# Patient Record
Sex: Female | Born: 1935 | Race: Black or African American | Hispanic: No | Smoking: Former smoker
Health system: Southern US, Community
[De-identification: ages and names within clinical notes are randomized; demographics above are authoritative.]

## PROBLEM LIST (undated history)

## (undated) DIAGNOSIS — F0390 Unspecified dementia without behavioral disturbance: Secondary | ICD-10-CM

## (undated) DIAGNOSIS — K219 Gastro-esophageal reflux disease without esophagitis: Secondary | ICD-10-CM

## (undated) DIAGNOSIS — F418 Other specified anxiety disorders: Secondary | ICD-10-CM

## (undated) DIAGNOSIS — E039 Hypothyroidism, unspecified: Secondary | ICD-10-CM

## (undated) DIAGNOSIS — E119 Type 2 diabetes mellitus without complications: Secondary | ICD-10-CM

## (undated) HISTORY — PX: LAMINECTOMY: SHX219

---

## 1998-01-25 ENCOUNTER — Other Ambulatory Visit: Admission: RE | Admit: 1998-01-25 | Discharge: 1998-01-25 | Payer: Self-pay | Admitting: *Deleted

## 1998-05-17 ENCOUNTER — Emergency Department (HOSPITAL_COMMUNITY): Admission: EM | Admit: 1998-05-17 | Discharge: 1998-05-17 | Payer: Self-pay | Admitting: Emergency Medicine

## 1998-05-17 ENCOUNTER — Encounter: Payer: Self-pay | Admitting: Emergency Medicine

## 1998-12-25 ENCOUNTER — Ambulatory Visit (HOSPITAL_COMMUNITY): Admission: RE | Admit: 1998-12-25 | Discharge: 1998-12-25 | Payer: Self-pay | Admitting: Gastroenterology

## 1998-12-25 ENCOUNTER — Encounter: Payer: Self-pay | Admitting: Gastroenterology

## 1999-01-02 ENCOUNTER — Encounter (INDEPENDENT_AMBULATORY_CARE_PROVIDER_SITE_OTHER): Payer: Self-pay

## 1999-01-02 ENCOUNTER — Ambulatory Visit (HOSPITAL_COMMUNITY): Admission: RE | Admit: 1999-01-02 | Discharge: 1999-01-02 | Payer: Self-pay | Admitting: Gastroenterology

## 1999-01-02 ENCOUNTER — Encounter: Payer: Self-pay | Admitting: Gastroenterology

## 2000-02-05 ENCOUNTER — Inpatient Hospital Stay (HOSPITAL_COMMUNITY): Admission: RE | Admit: 2000-02-05 | Discharge: 2000-02-06 | Payer: Self-pay | Admitting: Neurosurgery

## 2000-02-05 ENCOUNTER — Encounter: Payer: Self-pay | Admitting: Neurosurgery

## 2000-04-22 ENCOUNTER — Ambulatory Visit (HOSPITAL_COMMUNITY): Admission: RE | Admit: 2000-04-22 | Discharge: 2000-04-22 | Payer: Self-pay | Admitting: Neurology

## 2000-04-24 ENCOUNTER — Ambulatory Visit (HOSPITAL_COMMUNITY): Admission: RE | Admit: 2000-04-24 | Discharge: 2000-04-24 | Payer: Self-pay | Admitting: Internal Medicine

## 2000-04-24 ENCOUNTER — Encounter: Payer: Self-pay | Admitting: Internal Medicine

## 2000-04-29 ENCOUNTER — Ambulatory Visit (HOSPITAL_COMMUNITY): Admission: RE | Admit: 2000-04-29 | Discharge: 2000-04-29 | Payer: Self-pay | Admitting: Internal Medicine

## 2000-04-29 ENCOUNTER — Encounter: Payer: Self-pay | Admitting: Internal Medicine

## 2000-11-13 ENCOUNTER — Encounter: Payer: Self-pay | Admitting: General Surgery

## 2000-11-18 ENCOUNTER — Observation Stay (HOSPITAL_COMMUNITY): Admission: RE | Admit: 2000-11-18 | Discharge: 2000-11-19 | Payer: Self-pay | Admitting: General Surgery

## 2000-11-18 ENCOUNTER — Encounter (INDEPENDENT_AMBULATORY_CARE_PROVIDER_SITE_OTHER): Payer: Self-pay | Admitting: Specialist

## 2001-02-05 ENCOUNTER — Encounter: Admission: RE | Admit: 2001-02-05 | Discharge: 2001-02-05 | Payer: Self-pay | Admitting: *Deleted

## 2001-02-05 ENCOUNTER — Encounter: Payer: Self-pay | Admitting: *Deleted

## 2001-02-16 ENCOUNTER — Other Ambulatory Visit: Admission: RE | Admit: 2001-02-16 | Discharge: 2001-02-16 | Payer: Self-pay | Admitting: Gastroenterology

## 2001-02-16 ENCOUNTER — Encounter (INDEPENDENT_AMBULATORY_CARE_PROVIDER_SITE_OTHER): Payer: Self-pay | Admitting: Specialist

## 2001-06-16 ENCOUNTER — Encounter: Admission: RE | Admit: 2001-06-16 | Discharge: 2001-06-16 | Payer: Self-pay

## 2001-06-18 ENCOUNTER — Ambulatory Visit (HOSPITAL_COMMUNITY): Admission: RE | Admit: 2001-06-18 | Discharge: 2001-06-18 | Payer: Self-pay | Admitting: Internal Medicine

## 2001-06-18 ENCOUNTER — Encounter: Payer: Self-pay | Admitting: Internal Medicine

## 2001-06-24 ENCOUNTER — Encounter: Payer: Self-pay | Admitting: *Deleted

## 2001-06-24 ENCOUNTER — Inpatient Hospital Stay (HOSPITAL_COMMUNITY): Admission: EM | Admit: 2001-06-24 | Discharge: 2001-06-26 | Payer: Self-pay | Admitting: Emergency Medicine

## 2001-06-24 ENCOUNTER — Encounter: Payer: Self-pay | Admitting: Cardiology

## 2001-07-22 ENCOUNTER — Encounter: Admission: RE | Admit: 2001-07-22 | Discharge: 2001-07-22 | Payer: Self-pay | Admitting: Internal Medicine

## 2001-09-29 ENCOUNTER — Encounter: Admission: RE | Admit: 2001-09-29 | Discharge: 2001-09-29 | Payer: Self-pay | Admitting: Neurosurgery

## 2001-09-29 ENCOUNTER — Encounter: Payer: Self-pay | Admitting: Neurosurgery

## 2001-10-13 ENCOUNTER — Encounter: Payer: Self-pay | Admitting: Neurosurgery

## 2001-10-13 ENCOUNTER — Encounter: Admission: RE | Admit: 2001-10-13 | Discharge: 2001-10-13 | Payer: Self-pay | Admitting: Neurosurgery

## 2001-10-14 ENCOUNTER — Encounter: Admission: RE | Admit: 2001-10-14 | Discharge: 2001-10-14 | Payer: Self-pay | Admitting: Internal Medicine

## 2001-11-11 ENCOUNTER — Encounter: Admission: RE | Admit: 2001-11-11 | Discharge: 2001-11-11 | Payer: Self-pay | Admitting: Internal Medicine

## 2002-03-08 ENCOUNTER — Encounter: Admission: RE | Admit: 2002-03-08 | Discharge: 2002-03-08 | Payer: Self-pay | Admitting: Internal Medicine

## 2002-03-11 ENCOUNTER — Encounter: Payer: Self-pay | Admitting: Internal Medicine

## 2002-03-11 ENCOUNTER — Ambulatory Visit (HOSPITAL_COMMUNITY): Admission: RE | Admit: 2002-03-11 | Discharge: 2002-03-11 | Payer: Self-pay | Admitting: Internal Medicine

## 2002-05-21 ENCOUNTER — Encounter: Admission: RE | Admit: 2002-05-21 | Discharge: 2002-05-21 | Payer: Self-pay | Admitting: Internal Medicine

## 2002-07-02 ENCOUNTER — Encounter: Admission: RE | Admit: 2002-07-02 | Discharge: 2002-07-02 | Payer: Self-pay | Admitting: Internal Medicine

## 2002-08-05 ENCOUNTER — Encounter: Admission: RE | Admit: 2002-08-05 | Discharge: 2002-08-05 | Payer: Self-pay | Admitting: Internal Medicine

## 2002-09-02 ENCOUNTER — Encounter: Admission: RE | Admit: 2002-09-02 | Discharge: 2002-09-02 | Payer: Self-pay | Admitting: Internal Medicine

## 2002-09-09 ENCOUNTER — Encounter: Admission: RE | Admit: 2002-09-09 | Discharge: 2002-09-09 | Payer: Self-pay | Admitting: Internal Medicine

## 2002-10-18 ENCOUNTER — Encounter: Payer: Self-pay | Admitting: Emergency Medicine

## 2002-10-18 ENCOUNTER — Inpatient Hospital Stay (HOSPITAL_COMMUNITY): Admission: EM | Admit: 2002-10-18 | Discharge: 2002-10-22 | Payer: Self-pay | Admitting: Emergency Medicine

## 2002-10-19 ENCOUNTER — Encounter: Payer: Self-pay | Admitting: Internal Medicine

## 2002-10-21 ENCOUNTER — Encounter: Payer: Self-pay | Admitting: Internal Medicine

## 2002-11-25 ENCOUNTER — Encounter: Admission: RE | Admit: 2002-11-25 | Discharge: 2002-11-25 | Payer: Self-pay | Admitting: Internal Medicine

## 2002-12-30 ENCOUNTER — Encounter: Admission: RE | Admit: 2002-12-30 | Discharge: 2002-12-30 | Payer: Self-pay | Admitting: Internal Medicine

## 2003-01-01 ENCOUNTER — Encounter: Payer: Self-pay | Admitting: Internal Medicine

## 2003-01-01 ENCOUNTER — Encounter: Payer: Self-pay | Admitting: Emergency Medicine

## 2003-01-01 ENCOUNTER — Inpatient Hospital Stay (HOSPITAL_COMMUNITY): Admission: EM | Admit: 2003-01-01 | Discharge: 2003-01-04 | Payer: Self-pay | Admitting: Emergency Medicine

## 2003-01-04 ENCOUNTER — Encounter: Payer: Self-pay | Admitting: Internal Medicine

## 2003-01-13 ENCOUNTER — Encounter: Admission: RE | Admit: 2003-01-13 | Discharge: 2003-01-13 | Payer: Self-pay | Admitting: Internal Medicine

## 2003-02-17 ENCOUNTER — Ambulatory Visit (HOSPITAL_COMMUNITY): Admission: RE | Admit: 2003-02-17 | Discharge: 2003-02-17 | Payer: Self-pay | Admitting: Gastroenterology

## 2003-02-17 ENCOUNTER — Encounter: Payer: Self-pay | Admitting: Gastroenterology

## 2003-03-03 ENCOUNTER — Encounter: Admission: RE | Admit: 2003-03-03 | Discharge: 2003-03-03 | Payer: Self-pay | Admitting: Internal Medicine

## 2003-03-22 ENCOUNTER — Encounter: Admission: RE | Admit: 2003-03-22 | Discharge: 2003-03-22 | Payer: Self-pay | Admitting: Internal Medicine

## 2003-04-25 ENCOUNTER — Encounter: Admission: RE | Admit: 2003-04-25 | Discharge: 2003-04-25 | Payer: Self-pay | Admitting: Internal Medicine

## 2004-10-01 ENCOUNTER — Ambulatory Visit: Payer: Self-pay | Admitting: Internal Medicine

## 2004-10-29 ENCOUNTER — Ambulatory Visit: Payer: Self-pay | Admitting: Internal Medicine

## 2004-11-02 ENCOUNTER — Ambulatory Visit: Payer: Self-pay | Admitting: Internal Medicine

## 2005-03-05 ENCOUNTER — Inpatient Hospital Stay (HOSPITAL_COMMUNITY): Admission: EM | Admit: 2005-03-05 | Discharge: 2005-03-08 | Payer: Self-pay | Admitting: Emergency Medicine

## 2005-03-06 ENCOUNTER — Encounter: Payer: Self-pay | Admitting: Cardiology

## 2005-03-06 ENCOUNTER — Ambulatory Visit: Payer: Self-pay | Admitting: Cardiology

## 2005-03-06 ENCOUNTER — Ambulatory Visit: Payer: Self-pay | Admitting: Hospitalist

## 2005-04-03 ENCOUNTER — Encounter (INDEPENDENT_AMBULATORY_CARE_PROVIDER_SITE_OTHER): Payer: Self-pay | Admitting: *Deleted

## 2005-04-03 ENCOUNTER — Ambulatory Visit: Payer: Self-pay | Admitting: Gastroenterology

## 2005-04-05 ENCOUNTER — Ambulatory Visit: Payer: Self-pay | Admitting: Cardiology

## 2005-04-29 ENCOUNTER — Emergency Department (HOSPITAL_COMMUNITY): Admission: EM | Admit: 2005-04-29 | Discharge: 2005-04-29 | Payer: Self-pay | Admitting: Emergency Medicine

## 2005-05-10 ENCOUNTER — Ambulatory Visit: Payer: Self-pay | Admitting: Internal Medicine

## 2005-07-12 ENCOUNTER — Ambulatory Visit: Payer: Self-pay | Admitting: Internal Medicine

## 2006-01-07 ENCOUNTER — Encounter: Admission: RE | Admit: 2006-01-07 | Discharge: 2006-01-07 | Payer: Self-pay | Admitting: Internal Medicine

## 2006-01-26 ENCOUNTER — Emergency Department (HOSPITAL_COMMUNITY): Admission: AD | Admit: 2006-01-26 | Discharge: 2006-01-26 | Payer: Self-pay | Admitting: Family Medicine

## 2006-04-28 DIAGNOSIS — I2510 Atherosclerotic heart disease of native coronary artery without angina pectoris: Secondary | ICD-10-CM

## 2006-04-28 DIAGNOSIS — E039 Hypothyroidism, unspecified: Secondary | ICD-10-CM

## 2006-04-28 DIAGNOSIS — E119 Type 2 diabetes mellitus without complications: Secondary | ICD-10-CM

## 2006-04-28 DIAGNOSIS — IMO0002 Reserved for concepts with insufficient information to code with codable children: Secondary | ICD-10-CM | POA: Insufficient documentation

## 2006-04-28 DIAGNOSIS — E785 Hyperlipidemia, unspecified: Secondary | ICD-10-CM

## 2006-04-28 DIAGNOSIS — F329 Major depressive disorder, single episode, unspecified: Secondary | ICD-10-CM

## 2006-04-28 DIAGNOSIS — M5137 Other intervertebral disc degeneration, lumbosacral region: Secondary | ICD-10-CM

## 2006-12-23 ENCOUNTER — Ambulatory Visit: Payer: Self-pay | Admitting: Internal Medicine

## 2006-12-23 DIAGNOSIS — K644 Residual hemorrhoidal skin tags: Secondary | ICD-10-CM | POA: Insufficient documentation

## 2006-12-23 DIAGNOSIS — N9489 Other specified conditions associated with female genital organs and menstrual cycle: Secondary | ICD-10-CM | POA: Insufficient documentation

## 2006-12-23 LAB — CONVERTED CEMR LAB: Hgb A1c MFr Bld: 5.9 %

## 2006-12-24 ENCOUNTER — Encounter (INDEPENDENT_AMBULATORY_CARE_PROVIDER_SITE_OTHER): Payer: Self-pay | Admitting: *Deleted

## 2006-12-24 LAB — CONVERTED CEMR LAB
AST: 14 units/L (ref 0–37)
BUN: 14 mg/dL (ref 6–23)
Basophils Relative: 1 % (ref 0–1)
Creatinine, Ser: 0.74 mg/dL (ref 0.40–1.20)
Eosinophils Absolute: 0.2 10*3/uL (ref 0.0–0.7)
Eosinophils Relative: 3 % (ref 0–5)
Glucose, Bld: 99 mg/dL (ref 70–99)
HCT: 36.7 % (ref 36.0–46.0)
Lymphocytes Relative: 34 % (ref 12–46)
Lymphs Abs: 2 10*3/uL (ref 0.7–3.3)
MCHC: 32.4 g/dL (ref 30.0–36.0)
MCV: 100.3 fL — ABNORMAL HIGH (ref 78.0–100.0)
Platelets: 266 10*3/uL (ref 150–400)
Sodium: 143 meq/L (ref 135–145)
Total Bilirubin: 0.4 mg/dL (ref 0.3–1.2)
WBC: 5.8 10*3/uL (ref 4.0–10.5)

## 2007-02-16 ENCOUNTER — Ambulatory Visit: Payer: Self-pay | Admitting: Gastroenterology

## 2007-02-16 LAB — CONVERTED CEMR LAB
ALT: 17 units/L (ref 0–35)
AST: 20 units/L (ref 0–37)
Albumin: 3.3 g/dL — ABNORMAL LOW (ref 3.5–5.2)
Basophils Absolute: 0 10*3/uL (ref 0.0–0.1)
CO2: 31 meq/L (ref 19–32)
Calcium: 9.3 mg/dL (ref 8.4–10.5)
Eosinophils Absolute: 0.2 10*3/uL (ref 0.0–0.6)
Eosinophils Relative: 2.7 % (ref 0.0–5.0)
GFR calc non Af Amer: 73 mL/min
Hemoglobin: 10.9 g/dL — ABNORMAL LOW (ref 12.0–15.0)
Lipase: 59 units/L (ref 11.0–59.0)
Lymphocytes Relative: 29.6 % (ref 12.0–46.0)
MCHC: 33.7 g/dL (ref 30.0–36.0)
MCV: 98.4 fL (ref 78.0–100.0)
Neutrophils Relative %: 59.9 % (ref 43.0–77.0)
Potassium: 4.2 meq/L (ref 3.5–5.1)
RDW: 12.2 % (ref 11.5–14.6)
Sodium: 146 meq/L — ABNORMAL HIGH (ref 135–145)
Total Bilirubin: 0.5 mg/dL (ref 0.3–1.2)

## 2007-02-18 ENCOUNTER — Ambulatory Visit: Payer: Self-pay | Admitting: Gastroenterology

## 2007-02-18 ENCOUNTER — Ambulatory Visit: Payer: Self-pay | Admitting: Cardiology

## 2007-02-18 LAB — CONVERTED CEMR LAB: Iron: 59 ug/dL (ref 42–145)

## 2007-02-27 ENCOUNTER — Encounter (INDEPENDENT_AMBULATORY_CARE_PROVIDER_SITE_OTHER): Payer: Self-pay | Admitting: *Deleted

## 2007-03-19 ENCOUNTER — Ambulatory Visit: Payer: Self-pay | Admitting: Gastroenterology

## 2007-03-19 ENCOUNTER — Encounter: Payer: Self-pay | Admitting: Gastroenterology

## 2007-03-19 ENCOUNTER — Encounter (INDEPENDENT_AMBULATORY_CARE_PROVIDER_SITE_OTHER): Payer: Self-pay | Admitting: *Deleted

## 2007-05-01 ENCOUNTER — Ambulatory Visit: Payer: Self-pay | Admitting: Internal Medicine

## 2007-05-01 DIAGNOSIS — D499 Neoplasm of unspecified behavior of unspecified site: Secondary | ICD-10-CM

## 2007-05-01 DIAGNOSIS — R079 Chest pain, unspecified: Secondary | ICD-10-CM

## 2007-05-04 ENCOUNTER — Telehealth: Payer: Self-pay | Admitting: *Deleted

## 2007-05-20 ENCOUNTER — Ambulatory Visit: Payer: Self-pay | Admitting: Cardiology

## 2007-05-20 ENCOUNTER — Encounter (INDEPENDENT_AMBULATORY_CARE_PROVIDER_SITE_OTHER): Payer: Self-pay | Admitting: *Deleted

## 2007-05-27 ENCOUNTER — Encounter (INDEPENDENT_AMBULATORY_CARE_PROVIDER_SITE_OTHER): Payer: Self-pay | Admitting: *Deleted

## 2007-05-27 ENCOUNTER — Ambulatory Visit: Payer: Self-pay

## 2007-06-16 ENCOUNTER — Encounter (INDEPENDENT_AMBULATORY_CARE_PROVIDER_SITE_OTHER): Payer: Self-pay | Admitting: *Deleted

## 2007-06-16 ENCOUNTER — Ambulatory Visit: Payer: Self-pay | Admitting: Cardiology

## 2007-06-18 ENCOUNTER — Ambulatory Visit: Payer: Self-pay | Admitting: Internal Medicine

## 2007-06-18 LAB — CONVERTED CEMR LAB
Blood Glucose, Fingerstick: 181
Blood Glucose, Fingerstick: 123

## 2007-06-24 ENCOUNTER — Ambulatory Visit: Payer: Self-pay | Admitting: Internal Medicine

## 2007-06-24 DIAGNOSIS — L538 Other specified erythematous conditions: Secondary | ICD-10-CM | POA: Insufficient documentation

## 2007-06-25 ENCOUNTER — Encounter (INDEPENDENT_AMBULATORY_CARE_PROVIDER_SITE_OTHER): Payer: Self-pay | Admitting: *Deleted

## 2007-07-28 ENCOUNTER — Ambulatory Visit: Payer: Self-pay | Admitting: Internal Medicine

## 2007-07-28 LAB — CONVERTED CEMR LAB: Blood Glucose, Fingerstick: 118

## 2007-07-31 ENCOUNTER — Encounter: Payer: Self-pay | Admitting: Licensed Clinical Social Worker

## 2007-08-03 ENCOUNTER — Telehealth: Payer: Self-pay | Admitting: Licensed Clinical Social Worker

## 2007-08-05 ENCOUNTER — Telehealth: Payer: Self-pay | Admitting: *Deleted

## 2007-09-03 DIAGNOSIS — Z9889 Other specified postprocedural states: Secondary | ICD-10-CM

## 2007-09-03 DIAGNOSIS — Z9089 Acquired absence of other organs: Secondary | ICD-10-CM | POA: Insufficient documentation

## 2007-09-04 ENCOUNTER — Telehealth (INDEPENDENT_AMBULATORY_CARE_PROVIDER_SITE_OTHER): Payer: Self-pay | Admitting: *Deleted

## 2007-09-16 ENCOUNTER — Encounter (INDEPENDENT_AMBULATORY_CARE_PROVIDER_SITE_OTHER): Payer: Self-pay | Admitting: *Deleted

## 2007-09-21 ENCOUNTER — Encounter (INDEPENDENT_AMBULATORY_CARE_PROVIDER_SITE_OTHER): Payer: Self-pay | Admitting: *Deleted

## 2007-09-21 ENCOUNTER — Ambulatory Visit: Payer: Self-pay | Admitting: Internal Medicine

## 2007-09-21 DIAGNOSIS — D649 Anemia, unspecified: Secondary | ICD-10-CM

## 2007-09-29 LAB — CONVERTED CEMR LAB
Ferritin: 49 ng/mL (ref 10–291)
Iron: 26 ug/dL — ABNORMAL LOW (ref 42–145)
MCHC: 32.7 g/dL (ref 30.0–36.0)
Platelets: 259 10*3/uL (ref 150–400)
RBC: 3.85 M/uL — ABNORMAL LOW (ref 3.87–5.11)
TIBC: 377 ug/dL (ref 250–470)
Vitamin B-12: 544 pg/mL (ref 211–911)

## 2007-09-30 ENCOUNTER — Telehealth: Payer: Self-pay | Admitting: Infectious Disease

## 2007-10-08 ENCOUNTER — Telehealth: Payer: Self-pay | Admitting: Infectious Disease

## 2007-10-12 ENCOUNTER — Telehealth: Payer: Self-pay | Admitting: *Deleted

## 2007-10-15 ENCOUNTER — Telehealth (INDEPENDENT_AMBULATORY_CARE_PROVIDER_SITE_OTHER): Payer: Self-pay | Admitting: *Deleted

## 2007-10-20 ENCOUNTER — Encounter: Payer: Self-pay | Admitting: Infectious Disease

## 2007-10-23 ENCOUNTER — Encounter (INDEPENDENT_AMBULATORY_CARE_PROVIDER_SITE_OTHER): Payer: Self-pay | Admitting: *Deleted

## 2007-11-06 ENCOUNTER — Encounter (INDEPENDENT_AMBULATORY_CARE_PROVIDER_SITE_OTHER): Payer: Self-pay | Admitting: *Deleted

## 2007-11-16 ENCOUNTER — Encounter (INDEPENDENT_AMBULATORY_CARE_PROVIDER_SITE_OTHER): Payer: Self-pay | Admitting: *Deleted

## 2007-11-30 ENCOUNTER — Encounter (INDEPENDENT_AMBULATORY_CARE_PROVIDER_SITE_OTHER): Payer: Self-pay | Admitting: *Deleted

## 2007-12-14 ENCOUNTER — Telehealth: Payer: Self-pay | Admitting: Infectious Diseases

## 2008-01-13 ENCOUNTER — Ambulatory Visit: Payer: Self-pay | Admitting: *Deleted

## 2008-01-13 DIAGNOSIS — R1310 Dysphagia, unspecified: Secondary | ICD-10-CM | POA: Insufficient documentation

## 2008-01-15 ENCOUNTER — Encounter: Payer: Self-pay | Admitting: Licensed Clinical Social Worker

## 2008-01-18 ENCOUNTER — Encounter (INDEPENDENT_AMBULATORY_CARE_PROVIDER_SITE_OTHER): Payer: Self-pay | Admitting: Internal Medicine

## 2008-01-22 ENCOUNTER — Encounter (INDEPENDENT_AMBULATORY_CARE_PROVIDER_SITE_OTHER): Payer: Self-pay | Admitting: *Deleted

## 2008-01-22 ENCOUNTER — Ambulatory Visit (HOSPITAL_COMMUNITY): Admission: RE | Admit: 2008-01-22 | Discharge: 2008-01-22 | Payer: Self-pay | Admitting: Internal Medicine

## 2008-01-22 ENCOUNTER — Telehealth: Payer: Self-pay | Admitting: Licensed Clinical Social Worker

## 2008-02-05 ENCOUNTER — Ambulatory Visit: Payer: Self-pay | Admitting: Internal Medicine

## 2008-02-05 ENCOUNTER — Encounter: Payer: Self-pay | Admitting: Internal Medicine

## 2008-02-05 DIAGNOSIS — R109 Unspecified abdominal pain: Secondary | ICD-10-CM | POA: Insufficient documentation

## 2008-02-05 LAB — CONVERTED CEMR LAB
Blood Glucose, Fingerstick: 156
Leukocytes, UA: NEGATIVE
Nitrite: NEGATIVE
Protein, ur: NEGATIVE mg/dL
Urine Glucose: NEGATIVE mg/dL

## 2008-02-25 ENCOUNTER — Ambulatory Visit: Payer: Self-pay | Admitting: Gastroenterology

## 2008-03-17 ENCOUNTER — Encounter (INDEPENDENT_AMBULATORY_CARE_PROVIDER_SITE_OTHER): Payer: Self-pay | Admitting: *Deleted

## 2008-03-18 ENCOUNTER — Encounter (INDEPENDENT_AMBULATORY_CARE_PROVIDER_SITE_OTHER): Payer: Self-pay | Admitting: Internal Medicine

## 2008-03-29 ENCOUNTER — Ambulatory Visit: Payer: Self-pay | Admitting: Internal Medicine

## 2008-05-17 ENCOUNTER — Ambulatory Visit: Payer: Self-pay | Admitting: Internal Medicine

## 2008-05-17 ENCOUNTER — Encounter (INDEPENDENT_AMBULATORY_CARE_PROVIDER_SITE_OTHER): Payer: Self-pay | Admitting: *Deleted

## 2008-05-17 DIAGNOSIS — M25569 Pain in unspecified knee: Secondary | ICD-10-CM

## 2008-05-17 DIAGNOSIS — Z9189 Other specified personal risk factors, not elsewhere classified: Secondary | ICD-10-CM

## 2008-05-17 LAB — CONVERTED CEMR LAB
Blood Glucose, Fingerstick: 155
Hgb A1c MFr Bld: 5.4 %

## 2008-05-30 ENCOUNTER — Telehealth: Payer: Self-pay | Admitting: *Deleted

## 2008-06-01 ENCOUNTER — Telehealth (INDEPENDENT_AMBULATORY_CARE_PROVIDER_SITE_OTHER): Payer: Self-pay | Admitting: Internal Medicine

## 2008-06-22 ENCOUNTER — Telehealth: Payer: Self-pay | Admitting: *Deleted

## 2008-06-23 ENCOUNTER — Telehealth: Payer: Self-pay | Admitting: *Deleted

## 2008-06-23 ENCOUNTER — Ambulatory Visit: Payer: Self-pay | Admitting: Internal Medicine

## 2008-06-23 ENCOUNTER — Ambulatory Visit: Payer: Self-pay | Admitting: Cardiology

## 2008-06-23 ENCOUNTER — Inpatient Hospital Stay (HOSPITAL_COMMUNITY): Admission: EM | Admit: 2008-06-23 | Discharge: 2008-06-27 | Payer: Self-pay | Admitting: Emergency Medicine

## 2008-06-27 ENCOUNTER — Encounter (INDEPENDENT_AMBULATORY_CARE_PROVIDER_SITE_OTHER): Payer: Self-pay | Admitting: *Deleted

## 2008-07-05 ENCOUNTER — Telehealth (INDEPENDENT_AMBULATORY_CARE_PROVIDER_SITE_OTHER): Payer: Self-pay | Admitting: Internal Medicine

## 2008-07-20 ENCOUNTER — Telehealth (INDEPENDENT_AMBULATORY_CARE_PROVIDER_SITE_OTHER): Payer: Self-pay | Admitting: *Deleted

## 2008-07-21 ENCOUNTER — Encounter (INDEPENDENT_AMBULATORY_CARE_PROVIDER_SITE_OTHER): Payer: Self-pay | Admitting: *Deleted

## 2008-07-21 ENCOUNTER — Ambulatory Visit: Payer: Self-pay | Admitting: Internal Medicine

## 2008-07-21 DIAGNOSIS — E876 Hypokalemia: Secondary | ICD-10-CM | POA: Insufficient documentation

## 2008-07-21 DIAGNOSIS — F0390 Unspecified dementia without behavioral disturbance: Secondary | ICD-10-CM | POA: Insufficient documentation

## 2008-07-21 LAB — CONVERTED CEMR LAB
BUN: 12 mg/dL (ref 6–23)
Blood Glucose, Fingerstick: 104
Chloride: 109 meq/L (ref 96–112)
Creatinine, Ser: 0.96 mg/dL (ref 0.40–1.20)
Potassium: 4.7 meq/L (ref 3.5–5.3)
Sodium: 141 meq/L (ref 135–145)

## 2008-08-02 ENCOUNTER — Telehealth (INDEPENDENT_AMBULATORY_CARE_PROVIDER_SITE_OTHER): Payer: Self-pay | Admitting: Internal Medicine

## 2008-08-04 ENCOUNTER — Telehealth (INDEPENDENT_AMBULATORY_CARE_PROVIDER_SITE_OTHER): Payer: Self-pay | Admitting: Internal Medicine

## 2008-08-05 ENCOUNTER — Ambulatory Visit: Payer: Self-pay | Admitting: Cardiology

## 2008-08-09 ENCOUNTER — Telehealth (INDEPENDENT_AMBULATORY_CARE_PROVIDER_SITE_OTHER): Payer: Self-pay | Admitting: *Deleted

## 2008-08-09 ENCOUNTER — Encounter (INDEPENDENT_AMBULATORY_CARE_PROVIDER_SITE_OTHER): Payer: Self-pay | Admitting: *Deleted

## 2008-08-11 ENCOUNTER — Encounter (INDEPENDENT_AMBULATORY_CARE_PROVIDER_SITE_OTHER): Payer: Self-pay | Admitting: *Deleted

## 2008-08-12 ENCOUNTER — Encounter (INDEPENDENT_AMBULATORY_CARE_PROVIDER_SITE_OTHER): Payer: Self-pay | Admitting: *Deleted

## 2008-08-25 ENCOUNTER — Telehealth (INDEPENDENT_AMBULATORY_CARE_PROVIDER_SITE_OTHER): Payer: Self-pay | Admitting: *Deleted

## 2008-09-07 ENCOUNTER — Encounter (INDEPENDENT_AMBULATORY_CARE_PROVIDER_SITE_OTHER): Payer: Self-pay | Admitting: *Deleted

## 2008-09-07 ENCOUNTER — Ambulatory Visit: Payer: Self-pay | Admitting: Internal Medicine

## 2008-09-07 DIAGNOSIS — I10 Essential (primary) hypertension: Secondary | ICD-10-CM

## 2008-09-07 LAB — CONVERTED CEMR LAB
Blood Glucose, Fingerstick: 103
Hgb A1c MFr Bld: 5.8 %

## 2008-09-29 ENCOUNTER — Encounter (INDEPENDENT_AMBULATORY_CARE_PROVIDER_SITE_OTHER): Payer: Self-pay | Admitting: *Deleted

## 2008-10-17 ENCOUNTER — Ambulatory Visit: Payer: Self-pay | Admitting: *Deleted

## 2008-10-17 ENCOUNTER — Encounter: Payer: Self-pay | Admitting: Licensed Clinical Social Worker

## 2008-10-17 ENCOUNTER — Encounter (INDEPENDENT_AMBULATORY_CARE_PROVIDER_SITE_OTHER): Payer: Self-pay | Admitting: *Deleted

## 2008-10-17 DIAGNOSIS — R339 Retention of urine, unspecified: Secondary | ICD-10-CM | POA: Insufficient documentation

## 2008-10-17 LAB — CONVERTED CEMR LAB
BUN: 13 mg/dL (ref 6–23)
Blood Glucose, Fingerstick: 119
CO2: 20 meq/L (ref 19–32)
GFR calc Af Amer: >60 mL/min (ref >60)
GFR calc non Af Amer: >60 mL/min (ref >60)
Hemoglobin, Urine: NEGATIVE
Leukocytes, UA: NEGATIVE
Nitrite: NEGATIVE
Potassium: 4 meq/L (ref 3.5–5.3)
Protein, ur: NEGATIVE mg/dL
Sodium: 143 meq/L (ref 135–145)
Urobilinogen, UA: 0.2 (ref 0.0–1.0)

## 2008-10-24 ENCOUNTER — Telehealth (INDEPENDENT_AMBULATORY_CARE_PROVIDER_SITE_OTHER): Payer: Self-pay | Admitting: *Deleted

## 2008-11-03 ENCOUNTER — Encounter: Payer: Self-pay | Admitting: Licensed Clinical Social Worker

## 2008-11-03 ENCOUNTER — Telehealth: Payer: Self-pay | Admitting: Licensed Clinical Social Worker

## 2008-11-09 ENCOUNTER — Telehealth (INDEPENDENT_AMBULATORY_CARE_PROVIDER_SITE_OTHER): Payer: Self-pay | Admitting: *Deleted

## 2008-11-15 ENCOUNTER — Inpatient Hospital Stay (HOSPITAL_COMMUNITY): Admission: EM | Admit: 2008-11-15 | Discharge: 2008-11-18 | Payer: Self-pay | Admitting: Emergency Medicine

## 2008-11-15 ENCOUNTER — Ambulatory Visit: Payer: Self-pay | Admitting: Infectious Diseases

## 2008-11-17 ENCOUNTER — Ambulatory Visit: Payer: Self-pay | Admitting: Surgery

## 2008-11-17 ENCOUNTER — Encounter (INDEPENDENT_AMBULATORY_CARE_PROVIDER_SITE_OTHER): Payer: Self-pay | Admitting: Anesthesiology

## 2008-11-18 ENCOUNTER — Encounter (INDEPENDENT_AMBULATORY_CARE_PROVIDER_SITE_OTHER): Payer: Self-pay | Admitting: *Deleted

## 2008-11-23 ENCOUNTER — Encounter: Payer: Self-pay | Admitting: *Deleted

## 2008-12-09 ENCOUNTER — Telehealth: Payer: Self-pay | Admitting: Licensed Clinical Social Worker

## 2009-01-12 ENCOUNTER — Telehealth: Payer: Self-pay | Admitting: Internal Medicine

## 2009-01-16 ENCOUNTER — Telehealth: Payer: Self-pay | Admitting: *Deleted

## 2009-01-20 ENCOUNTER — Telehealth: Payer: Self-pay | Admitting: Internal Medicine

## 2009-01-25 ENCOUNTER — Telehealth: Payer: Self-pay | Admitting: Internal Medicine

## 2009-01-26 ENCOUNTER — Encounter: Payer: Self-pay | Admitting: Internal Medicine

## 2009-01-31 ENCOUNTER — Telehealth: Payer: Self-pay | Admitting: Licensed Clinical Social Worker

## 2009-02-17 ENCOUNTER — Encounter: Payer: Self-pay | Admitting: Internal Medicine

## 2009-02-18 ENCOUNTER — Encounter: Payer: Self-pay | Admitting: Internal Medicine

## 2009-02-20 ENCOUNTER — Telehealth: Payer: Self-pay | Admitting: Infectious Diseases

## 2009-02-24 ENCOUNTER — Telehealth (INDEPENDENT_AMBULATORY_CARE_PROVIDER_SITE_OTHER): Payer: Self-pay | Admitting: *Deleted

## 2009-02-24 ENCOUNTER — Encounter: Payer: Self-pay | Admitting: Internal Medicine

## 2009-03-09 ENCOUNTER — Telehealth (INDEPENDENT_AMBULATORY_CARE_PROVIDER_SITE_OTHER): Payer: Self-pay | Admitting: *Deleted

## 2009-04-12 ENCOUNTER — Telehealth: Payer: Self-pay | Admitting: *Deleted

## 2009-04-13 ENCOUNTER — Telehealth: Payer: Self-pay | Admitting: Licensed Clinical Social Worker

## 2009-04-14 ENCOUNTER — Telehealth: Payer: Self-pay | Admitting: Licensed Clinical Social Worker

## 2009-04-21 ENCOUNTER — Telehealth: Payer: Self-pay | Admitting: Internal Medicine

## 2009-05-03 ENCOUNTER — Encounter: Payer: Self-pay | Admitting: Internal Medicine

## 2009-05-24 ENCOUNTER — Encounter: Payer: Self-pay | Admitting: Internal Medicine

## 2009-05-30 ENCOUNTER — Telehealth (INDEPENDENT_AMBULATORY_CARE_PROVIDER_SITE_OTHER): Payer: Self-pay | Admitting: *Deleted

## 2009-06-06 ENCOUNTER — Telehealth (INDEPENDENT_AMBULATORY_CARE_PROVIDER_SITE_OTHER): Payer: Self-pay | Admitting: *Deleted

## 2009-06-07 ENCOUNTER — Telehealth: Payer: Self-pay | Admitting: *Deleted

## 2009-06-08 ENCOUNTER — Encounter (INDEPENDENT_AMBULATORY_CARE_PROVIDER_SITE_OTHER): Payer: Self-pay | Admitting: *Deleted

## 2010-01-29 ENCOUNTER — Telehealth: Payer: Self-pay | Admitting: Internal Medicine

## 2010-01-31 ENCOUNTER — Telehealth: Payer: Self-pay | Admitting: Internal Medicine

## 2010-02-13 ENCOUNTER — Telehealth: Payer: Self-pay | Admitting: *Deleted

## 2010-02-13 ENCOUNTER — Telehealth: Payer: Self-pay | Admitting: Internal Medicine

## 2010-05-07 ENCOUNTER — Telehealth: Payer: Self-pay | Admitting: Internal Medicine

## 2010-07-02 ENCOUNTER — Encounter: Payer: Self-pay | Admitting: Internal Medicine

## 2010-07-12 NOTE — Progress Notes (Signed)
Summary: Refill/gh  Phone Note Refill Request Message from:  Fax from Pharmacy on January 29, 2010 9:06 AM  Refills Requested: Medication #1:  METFORMIN HCL 500 MG  TABS Take 2 tablets by mouth two times a day   Last Refilled: 01/01/2010  Method Requested: Electronic Initial call taken by: Sander Nephew RN,  January 29, 2010 9:07 AM  Follow-up for Phone Call        Rx called to pharmacy Follow-up by: Jolene Provost MD,  January 30, 2010 3:46 PM    Prescriptions: METFORMIN HCL 500 MG  TABS (METFORMIN HCL) Take 2 tablets by mouth two times a day  #120 x 5   Entered and Authorized by:   Jolene Provost MD   Signed by:   Jolene Provost MD on 01/30/2010   Method used:   Electronically to        [facility] [address]* (retail)       [address]       [place], [place]  [address]       Ph: [phone] or [phone]       Fax: [phone]   RxID:   [id number]

## 2010-07-12 NOTE — Progress Notes (Signed)
Summary: med refill./gp  Phone Note Refill Request Message from:  Fax from Pharmacy on January 31, 2010 10:07 AM  Refills Requested: Medication #1:  LISINOPRIL 5 MG TABS Take 1 tablet by mouth once a day   Last Refilled: 01/03/2010 Last appt. 10/2008.   Method Requested: Electronic Initial call taken by: Morrison Old RN,  January 31, 2010 10:07 AM  Follow-up for Phone Call        Rx denied because patient needs to come in for appt.  Follow-up by: Jolene Provost MD,  February 04, 2010 3:32 PM  Additional Follow-up for Phone Call Additional follow up Details #1::        Flag sent to [place]. Additional Follow-up by: Morrison Old RN,  February 07, 2010 9:58 AM

## 2010-07-12 NOTE — Progress Notes (Signed)
Summary: refill/ hla  Phone Note Refill Request Message from:  Fax from Pharmacy on May 07, 2010 4:46 PM  Refills Requested: Medication #1:  nova max test strips   Dosage confirmed as above?Dosage Confirmed  Medication #2:  30g sms lancets   Dosage confirmed as above?Dosage Confirmed last visit 10/2008  Initial call taken by: Freddy Finner RN,  May 07, 2010 4:47 PM  Follow-up for Phone Call        Refill approved-nurse to complete Follow-up by: Jolene Provost MD,  May 09, 2010 9:50 AM    New/Updated Medications: NOVA MAX GLUCOSE TEST  STRP (GLUCOSE BLOOD) test blood sugar 1-3 times a day Prescriptions: NOVA MAX GLUCOSE TEST  STRP (GLUCOSE BLOOD) test blood sugar 1-3 times a day  #1 box x 11   Entered and Authorized by:   Jolene Provost MD   Signed by:   Jolene Provost MD on 05/09/2010   Method used:   Telephoned to ...       [facility])       [address] [place]  [address]       Ph: [phone]       Fax: [phone]   RxID:   [id number] ACCU-CHEK SOFTCLIX LANCETS   MISC (LANCETS) to check blood sugar once daily  #100 x prn   Entered and Authorized by:   Jolene Provost MD   Signed by:   Jolene Provost MD on 05/09/2010   Method used:   Telephoned to ...       [facility])       [address] [place]  [address]       Ph: [phone]       Fax: [phone]   RxID:   [id number]

## 2010-07-12 NOTE — Progress Notes (Signed)
Summary: Refill/gh  Phone Note Refill Request Message from:  Fax from Pharmacy on February 13, 2010 2:08 PM  Refills Requested: Medication #1:  LISINOPRIL 5 MG TABS Take 1 tablet by mouth once a day   Last Refilled: 01/03/2010  Method Requested: Electronic Initial call taken by: Sander Nephew RN,  February 13, 2010 2:08 PM  Follow-up for Phone Call        Pt was last seen 10/2008. This refill was denied 8/11 by Dr Ina Homes. I agree - pt needs to be seen prior to refill. Follow-up by: Larey Dresser MD,  February 13, 2010 5:20 PM     Appended Document: Refill/gh Denial faxed to [facility]. Sander Nephew, RN February 14, 2010 11:49 PM

## 2010-07-12 NOTE — Progress Notes (Signed)
----   Converted from flag ---- ---- 02/13/2010 9:31 AM, Morrison Old RN wrote:   ---- 02/07/2010 4:04 PM, Enedina Finner wrote: I have been unable to reach this patient by phone.  She has had several missed appointment and no voice mail picked up.  Will send appointment in the mail.  ---- 02/07/2010 9:57 AM, Morrison Old RN wrote: Patrick North, Ms. [place] needs an appt. soon to cont. refills per Dr. Ina Homes.  Thanks ------------------------------  Appended Document:  I finally talked to Ms. Hild; instructed her to keep the appt. scheduled with Dr. Ina Homes 9/15.  She said she does not go to the doctor; they come to her. Stated a Dr. Fredderick Phenix comes to see her at her home.  Stated no transportation but she has a Film/video editor. She will call and let the clinic know if she is going to be here 9/15.

## 2010-09-17 LAB — TROPONIN I: Troponin I: 0.05 ng/mL (ref 0.00–0.06)

## 2010-09-17 LAB — HEPATIC FUNCTION PANEL
ALT: 25 U/L (ref 0–35)
AST: 42 U/L — ABNORMAL HIGH (ref 0–37)
Albumin: 2.9 g/dL — ABNORMAL LOW (ref 3.5–5.2)
Alkaline Phosphatase: 54 U/L (ref 39–117)
Bilirubin, Direct: 0.5 mg/dL — ABNORMAL HIGH (ref 0.0–0.3)
Indirect Bilirubin: 1.1 mg/dL — ABNORMAL HIGH (ref 0.3–0.9)
Total Bilirubin: 1.6 mg/dL — ABNORMAL HIGH (ref 0.3–1.2)
Total Protein: 7.5 g/dL (ref 6.0–8.3)

## 2010-09-17 LAB — DIFFERENTIAL
Basophils Absolute: 0 10*3/uL (ref 0.0–0.1)
Basophils Relative: 0 % (ref 0–1)
Eosinophils Absolute: 0 10*3/uL (ref 0.0–0.7)
Eosinophils Relative: 0 % (ref 0–5)
Lymphocytes Relative: 6 % — ABNORMAL LOW (ref 12–46)
Lymphs Abs: 0.9 K/uL (ref 0.7–4.0)
Monocytes Absolute: 0.9 K/uL (ref 0.1–1.0)
Monocytes Relative: 7 % (ref 3–12)
Neutro Abs: 11.9 K/uL — ABNORMAL HIGH (ref 1.7–7.7)
Neutrophils Relative %: 87 % — ABNORMAL HIGH (ref 43–77)

## 2010-09-17 LAB — URINE MICROSCOPIC-ADD ON

## 2010-09-17 LAB — MAGNESIUM: Magnesium: 1.9 mg/dL (ref 1.5–2.5)

## 2010-09-17 LAB — CREATININE, URINE, RANDOM: Creatinine, Urine: 286.5 mg/dL

## 2010-09-17 LAB — URINALYSIS, ROUTINE W REFLEX MICROSCOPIC
Glucose, UA: NEGATIVE mg/dL
Ketones, ur: 15 mg/dL — AB
Nitrite: NEGATIVE
Protein, ur: >300 mg/dL — AB
Specific Gravity, Urine: 1.031 — ABNORMAL HIGH (ref 1.005–1.030)
Urobilinogen, UA: 1 mg/dL (ref 0.0–1.0)
pH: 5.5 (ref 5.0–8.0)

## 2010-09-17 LAB — BASIC METABOLIC PANEL
BUN: 20 mg/dL (ref 6–23)
CO2: 20 mEq/L (ref 19–32)
Calcium: 8.2 mg/dL — ABNORMAL LOW (ref 8.4–10.5)
Calcium: 8.4 mg/dL (ref 8.4–10.5)
Calcium: 9.8 mg/dL (ref 8.4–10.5)
Chloride: 107 mEq/L (ref 96–112)
Chloride: 107 mEq/L (ref 96–112)
Creatinine, Ser: 0.94 mg/dL (ref 0.4–1.2)
Creatinine, Ser: 1.03 mg/dL (ref 0.4–1.2)
GFR calc Af Amer: >60 mL/min (ref >60)
GFR calc non Af Amer: 51 mL/min — ABNORMAL LOW (ref >60)
Glucose, Bld: 117 mg/dL — ABNORMAL HIGH (ref 70–99)
Sodium: 135 mEq/L (ref 135–145)

## 2010-09-17 LAB — POCT CARDIAC MARKERS
CKMB, poc: 1.1 ng/mL (ref 1.0–8.0)
Myoglobin, poc: >500 ng/mL (ref 12–200)
Troponin i, poc: <0.05 ng/mL (ref 0.00–0.09)

## 2010-09-17 LAB — CBC
HCT: 32.8 % — ABNORMAL LOW (ref 36.0–46.0)
HCT: 39.7 % (ref 36.0–46.0)
Hemoglobin: 11.1 g/dL — ABNORMAL LOW (ref 12.0–15.0)
Hemoglobin: 13.1 g/dL (ref 12.0–15.0)
MCHC: 33 g/dL (ref 30.0–36.0)
MCHC: 33.7 g/dL (ref 30.0–36.0)
MCV: 100.8 fL — ABNORMAL HIGH (ref 78.0–100.0)
MCV: 99.3 fL (ref 78.0–100.0)
Platelets: 205 10*3/uL (ref 150–400)
RBC: 3.31 MIL/uL — ABNORMAL LOW (ref 3.87–5.11)
RBC: 4 MIL/uL (ref 3.87–5.11)
RDW: 12.6 % (ref 11.5–15.5)
RDW: 12.9 % (ref 11.5–15.5)
WBC: 13 10*3/uL — ABNORMAL HIGH (ref 4.0–10.5)
WBC: 13.7 10*3/uL — ABNORMAL HIGH (ref 4.0–10.5)

## 2010-09-17 LAB — GLUCOSE, CAPILLARY
Glucose-Capillary: 118 mg/dL — ABNORMAL HIGH (ref 70–99)
Glucose-Capillary: 126 mg/dL — ABNORMAL HIGH (ref 70–99)
Glucose-Capillary: 134 mg/dL — ABNORMAL HIGH (ref 70–99)
Glucose-Capillary: 136 mg/dL — ABNORMAL HIGH (ref 70–99)
Glucose-Capillary: 143 mg/dL — ABNORMAL HIGH (ref 70–99)
Glucose-Capillary: 150 mg/dL — ABNORMAL HIGH (ref 70–99)
Glucose-Capillary: 157 mg/dL — ABNORMAL HIGH (ref 70–99)
Glucose-Capillary: 158 mg/dL — ABNORMAL HIGH (ref 70–99)
Glucose-Capillary: 165 mg/dL — ABNORMAL HIGH (ref 70–99)
Glucose-Capillary: 177 mg/dL — ABNORMAL HIGH (ref 70–99)
Glucose-Capillary: 178 mg/dL — ABNORMAL HIGH (ref 70–99)
Glucose-Capillary: 179 mg/dL — ABNORMAL HIGH (ref 70–99)

## 2010-09-17 LAB — PROTIME-INR
INR: 1.3 (ref 0.00–1.49)
Prothrombin Time: 16.3 s — ABNORMAL HIGH (ref 11.6–15.2)

## 2010-09-17 LAB — CARDIAC PANEL(CRET KIN+CKTOT+MB+TROPI)
CK, MB: 2.3 ng/mL (ref 0.3–4.0)
Relative Index: 1 (ref 0.0–2.5)
Relative Index: 1.5 (ref 0.0–2.5)
Total CK: 129 U/L (ref 7–177)
Troponin I: 0.03 ng/mL (ref 0.00–0.06)
Troponin I: 0.04 ng/mL (ref 0.00–0.06)

## 2010-09-17 LAB — APTT: aPTT: 31 seconds (ref 24–37)

## 2010-09-17 LAB — CK TOTAL AND CKMB (NOT AT ARMC)
CK, MB: 1 ng/mL (ref 0.3–4.0)
Relative Index: 0.6 (ref 0.0–2.5)
Total CK: 177 U/L (ref 7–177)

## 2010-09-17 LAB — URINE CULTURE

## 2010-09-17 LAB — CK: Total CK: 176 U/L (ref 7–177)

## 2010-09-17 LAB — BASIC METABOLIC PANEL WITH GFR
CO2: 24 meq/L (ref 19–32)
GFR calc Af Amer: >60 mL/min (ref >60)
Glucose, Bld: 202 mg/dL — ABNORMAL HIGH (ref 70–99)
Potassium: 3.8 meq/L (ref 3.5–5.1)
Sodium: 144 meq/L (ref 135–145)

## 2010-09-17 LAB — TSH: TSH: 0.403 u[IU]/mL (ref 0.350–4.500)

## 2010-09-18 LAB — GLUCOSE, CAPILLARY: Glucose-Capillary: 119 mg/dL — ABNORMAL HIGH (ref 70–99)

## 2010-09-24 LAB — GLUCOSE, CAPILLARY
Glucose-Capillary: 157 mg/dL — ABNORMAL HIGH (ref 70–99)
Glucose-Capillary: 168 mg/dL — ABNORMAL HIGH (ref 70–99)
Glucose-Capillary: 169 mg/dL — ABNORMAL HIGH (ref 70–99)
Glucose-Capillary: 124 mg/dL — ABNORMAL HIGH (ref 70–99)
Glucose-Capillary: 154 mg/dL — ABNORMAL HIGH (ref 70–99)

## 2010-09-24 LAB — FOLATE RBC: RBC Folate: 803 ng/mL — ABNORMAL HIGH (ref 180–600)

## 2010-09-24 LAB — D-DIMER, QUANTITATIVE: D-Dimer, Quant: 1.54 ug/mL-FEU — ABNORMAL HIGH (ref 0.00–0.48)

## 2010-09-24 LAB — METHYLMALONIC ACID, SERUM: Methylmalonic Acid, Quantitative: 197 nmol/L (ref 87–318)

## 2010-09-24 LAB — CBC
HCT: 32.3 % — ABNORMAL LOW (ref 36.0–46.0)
HCT: 28.9 % — ABNORMAL LOW (ref 36.0–46.0)
Hemoglobin: 10.8 g/dL — ABNORMAL LOW (ref 12.0–15.0)
Hemoglobin: 9.1 g/dL — ABNORMAL LOW (ref 12.0–15.0)
Hemoglobin: 9.4 g/dL — ABNORMAL LOW (ref 12.0–15.0)
MCHC: 32.9 g/dL (ref 30.0–36.0)
MCHC: 33.4 g/dL (ref 30.0–36.0)
MCHC: 32.4 g/dL (ref 30.0–36.0)
MCHC: 33.4 g/dL (ref 30.0–36.0)
MCV: 100.3 fL — ABNORMAL HIGH (ref 78.0–100.0)
MCV: 101.2 fL — ABNORMAL HIGH (ref 78.0–100.0)
Platelets: 226 10*3/uL (ref 150–400)
Platelets: 242 10*3/uL (ref 150–400)
Platelets: 207 10*3/uL (ref 150–400)
RBC: 2.83 MIL/uL — ABNORMAL LOW (ref 3.87–5.11)
RDW: 13.1 % (ref 11.5–15.5)
RDW: 13.3 % (ref 11.5–15.5)
RDW: 13 % (ref 11.5–15.5)

## 2010-09-24 LAB — CK TOTAL AND CKMB (NOT AT ARMC)
CK, MB: 1.1 ng/mL (ref 0.3–4.0)
Relative Index: INVALID (ref 0.0–2.5)
Total CK: 65 U/L (ref 7–177)

## 2010-09-24 LAB — BASIC METABOLIC PANEL
BUN: 3 mg/dL — ABNORMAL LOW (ref 6–23)
CO2: 26 mEq/L (ref 19–32)
Calcium: 8.7 mg/dL (ref 8.4–10.5)
Calcium: 7.9 mg/dL — ABNORMAL LOW (ref 8.4–10.5)
Calcium: 8.2 mg/dL — ABNORMAL LOW (ref 8.4–10.5)
Chloride: 110 mEq/L (ref 96–112)
Creatinine, Ser: 0.93 mg/dL (ref 0.4–1.2)
Creatinine, Ser: 0.75 mg/dL (ref 0.4–1.2)
GFR calc Af Amer: >60 mL/min (ref >60)
GFR calc Af Amer: >60 mL/min (ref >60)
GFR calc non Af Amer: 58 mL/min — ABNORMAL LOW (ref >60)
GFR calc non Af Amer: >60 mL/min (ref >60)
Glucose, Bld: 106 mg/dL — ABNORMAL HIGH (ref 70–99)
Potassium: 3.6 mEq/L (ref 3.5–5.1)
Sodium: 141 mEq/L (ref 135–145)
Sodium: 141 mEq/L (ref 135–145)
Sodium: 143 mEq/L (ref 135–145)

## 2010-09-24 LAB — DIFFERENTIAL
Basophils Absolute: 0 10*3/uL (ref 0.0–0.1)
Eosinophils Absolute: 0.2 10*3/uL (ref 0.0–0.7)
Eosinophils Relative: 3 % (ref 0–5)
Monocytes Absolute: 0.4 10*3/uL (ref 0.1–1.0)

## 2010-09-24 LAB — COMPREHENSIVE METABOLIC PANEL
ALT: 15 U/L (ref 0–35)
AST: 21 U/L (ref 0–37)
Albumin: 3.4 g/dL — ABNORMAL LOW (ref 3.5–5.2)
Alkaline Phosphatase: 62 U/L (ref 39–117)
Calcium: 9.5 mg/dL (ref 8.4–10.5)
GFR calc Af Amer: >60 mL/min (ref >60)
Glucose, Bld: 118 mg/dL — ABNORMAL HIGH (ref 70–99)
Potassium: 3.8 mEq/L (ref 3.5–5.1)
Sodium: 142 mEq/L (ref 135–145)
Total Protein: 6.8 g/dL (ref 6.0–8.3)

## 2010-09-24 LAB — URINALYSIS, ROUTINE W REFLEX MICROSCOPIC
Bilirubin Urine: NEGATIVE
Glucose, UA: NEGATIVE mg/dL
Ketones, ur: NEGATIVE mg/dL
Protein, ur: NEGATIVE mg/dL
pH: 6.5 (ref 5.0–8.0)

## 2010-09-24 LAB — IRON AND TIBC
Iron: 75 ug/dL (ref 42–135)
Saturation Ratios: 21 % (ref 20–55)
UIBC: 279 ug/dL

## 2010-09-24 LAB — POCT I-STAT, CHEM 8
BUN: 22 mg/dL (ref 6–23)
Calcium, Ion: 1.2 mmol/L (ref 1.12–1.32)
Chloride: 108 mEq/L (ref 96–112)
HCT: 34 % — ABNORMAL LOW (ref 36.0–46.0)
Sodium: 145 mEq/L (ref 135–145)
TCO2: 25 mmol/L (ref 0–100)

## 2010-09-24 LAB — CARDIAC PANEL(CRET KIN+CKTOT+MB+TROPI)
Relative Index: INVALID (ref 0.0–2.5)
Total CK: 64 U/L (ref 7–177)
Troponin I: 0.01 ng/mL (ref 0.00–0.06)

## 2010-09-24 LAB — TROPONIN I: Troponin I: <0.01 ng/mL (ref 0.00–0.06)

## 2010-09-24 LAB — RETICULOCYTES
RBC.: 3.33 MIL/uL — ABNORMAL LOW (ref 3.87–5.11)
Retic Count, Absolute: 66.6 10*3/uL (ref 19.0–186.0)

## 2010-09-24 LAB — URINE CULTURE: Colony Count: 50000

## 2010-09-24 LAB — VITAMIN B12: Vitamin B-12: 959 pg/mL — ABNORMAL HIGH (ref 211–911)

## 2010-09-24 LAB — URINE MICROSCOPIC-ADD ON

## 2010-09-24 LAB — CULTURE, BLOOD (ROUTINE X 2): Culture: NO GROWTH

## 2010-09-24 LAB — POCT CARDIAC MARKERS
Myoglobin, poc: 69.5 ng/mL (ref 12–200)
Troponin i, poc: <0.05 ng/mL (ref 0.00–0.09)

## 2010-09-24 LAB — LIPID PANEL
HDL: 38 mg/dL — ABNORMAL LOW (ref >39)
Triglycerides: 98 mg/dL (ref <150)
VLDL: 20 mg/dL (ref 0–40)

## 2010-09-24 LAB — LIPASE, BLOOD: Lipase: 69 U/L — ABNORMAL HIGH (ref 11–59)

## 2010-09-24 LAB — THYROXINE BINDING GLOBULIN: Thyroxine Bind Glob: 17 ug/mL (ref 13.0–30.0)

## 2010-09-25 LAB — GLUCOSE, CAPILLARY: Glucose-Capillary: 104 mg/dL — ABNORMAL HIGH (ref 70–99)

## 2010-10-23 NOTE — Assessment & Plan Note (Signed)
[place] OFFICE NOTE   TEESHA, GUNNERSON                     MRN:          [id number]  DATE:08/05/2008                            DOB:          1923-10-10    Ms. Passe is here for cardiology followup after her recent  hospitalization.  She was admitted with gastroenteritis and some chest  discomfort.  There was no evidence of an MI.  She stabilized and was  sent home.  There was no evidence of pulmonary embolus.  She is now here  for cardiology followup.  She had been seen in consultation by our group  in the hospital.  Today, she is doing well.  She has a history of a  carcinoid tumor.  There is question of whether or not an echo should be  done to further assess for tricuspid regurgitation.  I do not hear  murmur and clinically she is stable.   PAST MEDICAL HISTORY:   ALLERGIES:  PENICILLIN, SULFA, and CODEINE.   MEDICATIONS:  See the flow sheet in the chart.   REVIEW OF SYSTEMS:  Today, she feels well.  She is not having any chest  pain.  She has no shortness of breath.  There are no fevers or chills.  She has no headaches or skin rashes.  Otherwise, her review of systems  is negative.   PHYSICAL EXAMINATION:  VITAL SIGNS:  Blood pressure is 110/60 with a  pulse of 92.  GENERAL:  The patient is oriented to person, time and place.  Affect is  normal.  HEENT:  No xanthelasma.  She has normal extraocular motion.  NECK:  There are no carotid bruits.  There is no jugular venous  distention.  LUNGS:  Clear.  Respiratory effort is not labored.  CARDIAC:  An S1 with an S2.  There are no clicks or significant murmurs.  ABDOMEN:  Soft.  She has no significant peripheral edema.   EKG reveals that she has normal sinus rhythm.   PROBLEMS:  1. Recent hospitalization with chest discomfort felt not to be      cardiac.  2. History of carcinoid tumor.  3. Hypothyroidism.  4. History of depression.  5.  Diabetes.  6. Recent nausea, vomiting, and diarrhea that is resolved.   Cardiac status is stable.  I feel that she does not need any further  cardiac workup at this time.     Carlena Bjornstad, MD, [facility]  Electronically Signed    JDK/MedQ  DD: 08/05/2008  DT: 08/06/2008  Job #: [id number]

## 2010-10-23 NOTE — H&P (Signed)
Alexandra Booth, Alexandra Booth NO.:  [id number]   MEDICAL RECORD NO.:  [id number]           PATIENT TYPE:   LOCATION:                                 FACILITY:   PHYSICIAN:  Doran Heater. [name], M.D. DATE OF BIRTH:  1923-09-06   DATE OF ADMISSION:  DATE OF DISCHARGE:                              HISTORY & PHYSICAL   REQUESTING PHYSICIAN:  Dr. Hart Carwin of the Internal Medicine Teaching  Service.   REASON FOR CONSULTATION:  Right elbow nondisplaced radial head fracture.   HISTORY OF PRESENT ILLNESS:  The patient is an 77 year old female who  reports a fall injuring her right arm and right hip approximately 4-5  days ago.  She is complaining of right elbow pain and right hip pain.  She is currently in a posterior splint that was placed for a  nondisplaced radial head fracture.  The patient reports feeling much  better in her splint but still having pain at the elbow.  She is having  some hip pain but was thoroughly worked up for a hip fracture with  negative x-rays.   PAST MEDICAL HISTORY:  1. Diabetes.  2. Hypertension.  3. Hypothyroidism.  4. Depression.  5. Osteoarthritis.  6. History of carcinoid tumor.  7. History of anxiety.  8. Macrocytic anemia.   PAST SURGICAL HISTORY:  1. Appendectomy.  2. Cholecystectomy.  3. History of hysterectomy.   CURRENT MEDICATIONS:  Please see MAR.   ALLERGIES:  The patient is allergic to PENICILLIN and SULFA.   SOCIAL HISTORY:  The patient does have a history of remote tobacco use,  denies alcohol use and is a widow.  The patient lives at home by  herself.  She does report some falls in the past.   PHYSICAL EXAMINATION:  GENERAL:  An elderly female, in mild distress.  She is alert and oriented.  EXTREMITIES:  Examination of right upper extremity reveals well-applied  posterior splint.  She has good distal perfusion with good pulses.  She  is not significantly swollen in her hand.  She can move her fingers well  and has  good sensation.  Her shoulder is nontender, nonswollen.  Right  hip, pain free.  Internal and external rotation of the hip while seated  with a little bit of pain at the extremes.  She has normal range of  motion at the knee and ankle, good plantar flexion, dorsiflexion, and  strength.   Radiographs of the pelvis and hip are negative for fracture.  Radiographs of the elbow question of a nondisplaced radial head  fracture, otherwise congruent joint.   IMPRESSION:  Right elbow questionable radial head fracture and right hip  injury with no sign of obvious fracture.   PLAN:  I discussed with Ms. Subramaniam that she does have a fracture  which will heal upon its own.  I have recommended 1 week in the  posterior splint immobilization followed by removal of that splint and  beginning occupational therapy for range of motion of that right arm, as  well as activities of daily living.  She is to be minimal weightbearing  in the right arm at least for a month.  We would be happy to see her  back in Orthopedics in about 2-3 weeks in our office, [employer].  Our number there is [phone].  For the  hip, I would recommend physical therapy for general mobilization,  weightbearing as tolerated on the right lower extremity with assistance.  I would be concerned about both the right elbow and right hip injury  potentially creating a significant fall risk and balance problem for  her.  Certainly, after physical therapy assessment, it may be necessary  for to attend a short-term rehab stance in a skilled nursing facility.  Again, we would like to see her back in 2-3 weeks.      Doran Heater. [name], M.D.  Electronically Signed     SRN/MEDQ  D:  11/16/2008  T:  11/17/2008  Job:  [id number]

## 2010-10-23 NOTE — Assessment & Plan Note (Signed)
[place] OFFICE NOTE   Alexandra Booth, Alexandra Booth                     MRN:          [id number]  DATE:02/16/2007                            DOB:          02-21-1924    HISTORY:  Alexandra Booth is an 77 year old African-American female who  returns to see me at the request of Dr. Delman Kitten.  Alexandra Booth has had  persistent diarrhea for the past 7 to 8 months associated with  intermittent small volume hematochezia.  She relates an ongoing lower  abdominal discomfort.  She describes her diarrhea as urgent and watery.  She also relates some chest symptoms of pressure and shortness of  breath.  I advised her to return to Dr. Delman Kitten for further evaluation.  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  She declined recommendations to proceed with surgical excision  in 2004 and again in October, 2006.   CURRENT MEDICATIONS:  Current medications listed on the chart, updated  and reviewed.   MEDICATIONS ALLERGIES:  SULFA DRUGS, PENICILLIN and CODEINE.   PHYSICAL EXAMINATION:  GENERAL APPEARANCE:  No acute distress.  VITAL SIGNS:  Weight 203 pounds.  Blood pressure 100/50. Pulse 88 and  regular.  CHEST:  Clear to auscultation bilaterally.  CARDIAC:  Regular rate and rhythm without murmurs appreciated.  ABDOMEN:  Soft, nondistended.  Mild diffuse tenderness to deep  palpation.  No rebound or guarding.  No palpable organomegaly, masses or  hernias.  Normal active bowel sounds.  RECTAL:  The rectal examination is deferred to the time of colonoscopy.  NEUROLOGICAL:  Alert and oriented x3.  Grossly nonfocal.   ASSESSMENT/PLAN:  History of duodenal bulb carcinoid tumor.  Chronic  diarrhea and lower abdominal pain.  The patient has refused prior  recommendations for surgical management.  Will proceed with a CT scan of  the abdomen and pelvis.  Obtain a CMET, CBC, lipase and 24-hour  urine  collection for 5-HIAA.  The risks, benefits and alternatives to  colonoscopy with possibly biopsy and possible polypectomy and upper  endoscopy with possible biopsy discussed with the patient and she  consents to proceed.  This will be scheduled electively.  I am also  concerned that she may have medication side effects leading to diarrhea  including potential side effects from metformin, Cymbalta and other  medications.  She will follow up with Dr. Delman Kitten.     Pricilla Riffle. Fuller Plan, MD, [facility]  Electronically Signed    MTS/MedQ  DD: 02/16/2007  DT: 02/17/2007  Job #: [id number]   cc:   Mohammed Kindle, M.D.

## 2010-10-23 NOTE — Consult Note (Signed)
[name], NICHOLL NO.:  [id number]   MEDICAL RECORD NO.:  [id number]          PATIENT TYPE:  INP   LOCATION:  64                         FACILITY:  [facility]   PHYSICIAN:  Denice Booth. Stanford Breed, MD, FACCDATE OF BIRTH:  [birth year]   DATE OF CONSULTATION:  06/25/2008  DATE OF DISCHARGE:                                 CONSULTATION   The patient is an 77 year old patient of Dr. Daryel Booth who we were asked  to evaluate for chest pain.  The patient apparently has a history of  chronic chest pain based on Dr. Kae Booth notes.  She did undergo cardiac  catheterization on June 25, 2001.  At that time, she was found to  have normal LV function.  She also was found to have minor  irregularities in her left main.  She had 20% distal LAD.  There was  also a 20% circumflex.  She also apparently had a Myoview performed in  December 2008.  Admission H and P states that it was normal, but I do  not have those records available.  She was admitted by the Teaching  Service on June 23, 2008.  At that time, she was complaining of chest  pain.  The pain was diffuse in nature.  It did not radiate.  It  increased with cough.  She also was having nausea and vomiting,  increased shortness of breath, and a cough productive of greenish  sputum.  She was also having diarrhea.  Of note, she was not having  diaphoresis.  She did rule out for myocardial infarction with serial  enzymes.  She also states that her chest pain has markedly improved  since admission.  It is felt that her nausea, vomiting, and diarrhea are  most likely related to a viral gastroenteritis, and she is being treated  with hydration.  She has also been noted to be anemic and have dark  stools.  Because of her chest pain, Cardiology is asked to further  evaluate.  Of note, she has had intermittent chest pain apparently for  years.  She takes nitroglycerin for this at home.  It typically is not  related to exertion.  She  also has chronic dyspnea on exertion, but she  states this is worse recently.  She also has orthopnea, but there is no  PND or pedal edema.   PRESENT MEDICATIONS:  1. Aspirin 325 mg p.o. daily.  2. Levothyroxine 25 mcg p.o. daily.  3. Lyrica 50 mg p.o. t.i.d.  4. Remeron 50 mg p.o. nightly.  5. Aricept 5 mg daily p.o. nightly.  6. Protonix 40 mg p.o. b.i.d.   ALLERGIES:  She has an allergy to PENICILLIN and SULFA.   SOCIAL HISTORY:  She has remote history of tobacco use but has not  smoked in 50 years.  She does not consume alcohol.  She is widowed.   FAMILY HISTORY:  She does state that her mother had heart problems, she  is unclear of what type.  She had a son who died of lung cancer.   PAST MEDICAL HISTORY:  1. She is  diabetic.  2. She also has a history of hypertension by her report.  3. She also has hypothyroidism.  4. She has a history of depression as well as osteoarthritis.  5. She also has a history of carcinoid and has refused surgery in the      past.  6. She has had a prior hysterectomy.  7. There is a history of anxiety.  8. She also has a history of macrocytic anemia.  9. She has had a prior appendectomy and cholecystectomy by her report      as well.   REVIEW OF SYSTEMS:  She denies any headaches, fevers, or chills.  She  has had a productive cough.  There is no hemoptysis.  She occasionally  feels dysphagia, but there is no odynophagia.  She has had dark stools.  There is no hematochezia.  There is no dysuria or hematuria.  There are  no rashes or seizure activity.  There is orthopnea, but there is no PND.  She states she has pedal edema at times.  Her remaining systems are  negative.   PHYSICAL EXAMINATION:  VITAL SIGNS:  Today shows a blood pressure of  110/58 and a pulse of 75.  Her temperature is 97.7.  GENERAL:  She is well developed and somewhat obese.  She is in no acute  distress at present.  SKIN:  Warm and dry.  She does not appear to be  depressed.  There is no  peripheral clubbing.  BACK:  Normal.  HEENT:  Normal with normal eyelids.  NECK:  Supple with a normal upstroke bilaterally.  There are bilateral  carotid bruits noted.  There is no jugular venous distention.  I cannot  appreciate thyromegaly.  CHEST:  Clear to auscultation with expansion.  CARDIOVASCULAR:  Regular rhythm with a normal S1 and S2.  There are no  murmurs, rubs, or gallops noted.  ABDOMEN:  Nontender, nondistended.  Positive bowel sounds.  No  hepatosplenomegaly.  No mass appreciated.  There is no abdominal bruit.  She has 2+ femoral pulses bilaterally.  No bruits.  EXTREMITIES:  No edema I cannot palpate.  No cords.  She has 2+ dorsalis  pedis pulses bilaterally.  NEUROLOGIC:  Grossly intact.   Of note, she has had negative cardiac markers.  Her sodium is 143 with a  potassium of 3.6.  Her BUN and creatinine are 18 and 0.88 respectively.  Her white blood cell count is 7.1 with a hemoglobin of 9.4, hematocrit  of 28.9.  Her MCV is 102.3.  her platelet count is 206.  Her TSH is  normal.  She also had a D-dimer that was elevated at 1.54.  Her BNP was  less than 30.  She did have a CT of her chest that showed no significant  pulmonary embolus, although the examination was limited.  There was  bilateral lower lobe platelike atelectasis.  There was a large hiatal  hernia.  Electrocardiogram shows a normal sinus rhythm with nonspecific  T-wave changes.   DIAGNOSES:  1. Atypical chest pain - [name]'s symptom is most likely      musculoskeletal in origin.  She states that when she presented she      was having a productive cough and her chest pain increased with      cough.  Her enzymes have been negative, and her electrocardiogram      is nondiagnostic.  Her chest CT showed no pulmonary embolus, and  her BNP is normal.  I do not think we need to pursue further      cardiac workup at this point.  I would have the patient followup      with  Dr. Ron Parker in 2-4 weeks after discharge for consideration of      further evaluation.  It may be worthwhile to perform an      echocardiogram as an outpatient given her history of carcinoid (we      would like to rule out tricuspid regurgitation).  2. History of carcinoid - Management per primary care.  We will plan      to proceed with an echocardiogram as an outpatient.  3. Hypothyroidism.  4. History of depression.  5. Diabetes mellitus.  6. Nausea, vomiting/diarrhea - This is felt to be gas related to her      possible viral bowel gastroenteritis.  7. Macrocytic anemia - Management per the Primary Care Service.   Please call with any further questions while she is in the hospital.      Denice Booth. Stanford Breed, MD, [facility]  Electronically Signed     BSC/MEDQ  D:  06/25/2008  T:  06/25/2008  Job:  [id number]

## 2010-10-23 NOTE — Assessment & Plan Note (Signed)
[place]                            CARDIOLOGY OFFICE NOTE   NDIDI, RAMASWAMY                     MRN:          [id number]  DATE:05/20/2007                            DOB:          11/24/1923    CARDIOLOGY CONSULTATION:  The patient has had chest pain.  It is my  understanding that a catheterization has been done in the past and  showed no marked abnormality.  There was nonobstructive disease.  It was  my understanding this was done in 2003.  More recently she has had chest  discomfort.  With her discussion today it is very difficult to  understand her pain.  I do not believe that she is unstable at this time  today.   PAST MEDICAL HISTORY:  Allergies:   Dictation cancelled.     Carlena Bjornstad, MD, [facility]     JDK/MedQ  DD: 05/20/2007  DT: 05/21/2007  Job #: [id number]   cc:   Mohammed Kindle, M.D.

## 2010-10-23 NOTE — Assessment & Plan Note (Signed)
[place]                            CARDIOLOGY OFFICE NOTE   LORRINDA, MAZER                     MRN:          [id number]  DATE:05/20/2007                            DOB:          1923-09-17    [name] is reviewed for the evaluation of chest pain.  Catheterization had been done in 2003, and she had very minimal coronary  disease at that time.  The patient now has chest pain on a regular  basis.  She describes it as discomfort over her entire anterior chest.  It can be present for long periods of time at rest.  It appears to be  increasing in frequency, and she is here for further evaluation.   PAST MEDICAL HISTORY:   ALLERGIES:  1. PENICILLIN.  2. SULFA.  3. CODEINE.   MEDICATIONS:  1. Lyrica.  2. Aricept.  3. Metformin.  4. Aspirin.  5. Cymbalta.  6. Imdur.  7. Xanax.  8. Synthroid.  9. Protonix.  10.Repliva eye drops.  11.A vitamin.   OTHER MEDICAL PROBLEMS:  See the list below.   SOCIAL HISTORY:  The patient is widowed, and I do not have any work  history.  She does not smoke.   FAMILY HISTORY:  There is a family history of coronary disease, although  this is probably not contributory in this patient.   PHYSICAL EXAMINATION:  VITAL SIGNS:  The patient's blood pressure is  129/62 with a pulse of 90.  GENERAL:  The patient is oriented to person, time and place.  Her affect  reveals that her history is vague in general.  HEENT:  No xanthelasma.  She has normal extraocular motion.  There are  no carotid bruits.  There is no jugular venous distention.  LUNGS:  Clear.  Respiratory effort is not labored.  CARDIAC:  An S1 with an S2.  There are no clicks or significant murmurs.  ABDOMEN:  Soft.  There are no masses or bruits.  EXTREMITIES:  She has no significant peripheral edema.   ELECTROCARDIOGRAM:  Sinus rhythm.  There is decreased R wave in lead V2.  This may be positional.   PROBLEMS:  1. Diabetes.  2.  Hyperlipidemia.  3. Hypothyroidism.  4. Gastroesophageal reflux disease.  5. Depression.  6. Lumbar radiculopathy.  7. Degenerative disk disease.  8. Carcinoid tumor.  She has carcinoid in her duodenum.  She has      refused surgery for this on several occasions.  She still has some      diarrhea related to this.  It is of note that an echo done in      September of 2006 revealed good left ventricular function.  In      addition, there is no significant abnormality of the tricuspid      valve.  9. Chest pain.  At this point, it is not clear if this is angina or      not, but I am not convinced.  We do need to proceed with a Myoview      scan, and this will be arranged.  If  this were to be high risk,      catheterization would be considered.  Otherwise, she is to be      followed medically.   Some patient's with carcinoid tumors can have abnormalities of the  tricuspid valve.  This was not documented by echo, at least in 2006.  Follow up 2-D echocardiogram could be considered at this point, but I  did not feel that this was a pressing issue.  She will have a Myoview  scan, and I will see her for follow up.     Carlena Bjornstad, MD, [facility]  Electronically Signed    JDK/MedQ  DD: 05/20/2007  DT: 05/21/2007  Job #: [id number]   cc:   Internal Medicine Teaching Service  Mohammed Kindle, M.D.

## 2010-10-23 NOTE — Discharge Summary (Signed)
[name], Alexandra Booth NO.:  [id number]   MEDICAL RECORD NO.:  [id number]          PATIENT TYPE:  INP   LOCATION:  5501                         FACILITY:  [facility]   PHYSICIAN:  Felicity Pellegrini, MD     DATE OF BIRTH:  April 13, 1924   DATE OF ADMISSION:  11/15/2008  DATE OF DISCHARGE:  11/18/2008                               DISCHARGE SUMMARY   DISCHARGE DIAGNOSES:  1. Status post fall with likely right radial head fracture, evaluated      by orthopedics and placed in posterior splint immobilization.  2. General instability on feet and chronic hip pain making future      falls more likely, necessitating skilled nursing facility      placement.  3. Mild fever up to 100.7 in the setting of mild leukocytosis and      hypoxia down to an O2 saturation of 90% on room air, portable chest      x-ray showed atelectasis versus pneumonia, treated with 7-day      course of Avelox.  4. Question of urinary tract infection, received 3 days of Cipro.  5. Diabetes.  6. Hypothyroidism.  7. Moderate depression, evaluated in the outpatient clinic on numerous      occasions and somewhat refractory to treatment and the patient has      expressed unwillingness to pursue psychiatric workup.  8. Dementia, on Aricept.  9. Osteoarthritis.  10.Iron-deficiency anemia.  11.History of noncardiac chest pain.  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   DISCHARGE MEDICATIONS:  1. Avelox 400 mg p.o. x 6 days, prescription provided.  2. Vicodin 5/325, one tablet p.o. q.6 h. p.r.n. pain, prescription      provided.  3. Synthroid 25 mcg p.o. daily.  4. Metformin 500 mg 2 tablets p.o. twice a day.  5. Nitroglycerin 0.54 mg tabs sublingual p.r.n. chest pain.  6. Xanax 0.25 mg p.o. t.i.d. p.r.n. anxiety.  7. Lyrica 50 mg p.o. t.i.d.  8. Remeron 15 mg p.o. q.h.s.  9. Aricept 5 mg p.o. daily.  10.Loperamide 2 mg p.o. b.i.d. p.r.n.  diarrhea.  11.Omeprazole 40 mg p.o. b.i.d.  12.Lisinopril 5 mg p.o. daily.   CONDITION ON DISCHARGE:  The patient's temperature was taken shortly  pulse before discharge and it was 99, the patient was comfortable and  was satting well and was deemed suitable for discharge.  The patient is  to be discharged to skilled nursing facility where it is our hope that  she will undergo rehabilitation to regain strength and to become more  stable on her feet.  She will also finish a 7-day course of Avelox for  presumptive mild pneumonia.  The patient has an appointment scheduled  with Dr. Grier Rocher of the outpatient clinic on June 29 at 9 a.m.  Should  she not be able to make this, she can call the clinic to have it  rescheduled.   CONSULTATIONS:  Dr. [name] of Orthopedics.  Please see his history and  physical in e-chart.   IMAGING:  1.  Right hip x-ray:  No acute finding, right his hip osteoarthritis.  2. Pelvis one to two views:  No acute bony findings.  3. Right humerus x-ray:  No acute bony findings.  4. Right shoulder x-ray:  Degenerative changes, no fracture or      dislocation.  5. Two-view chest x-ray with following impression:  Chronic lung      changes but no acute pulmonary findings.  Low lung volumes with      vascular crowding and atelectasis.  Grossly intact bony thorax.  6. CT of the head:  No acute intracranial findings, generalized      cortical atrophy, microvascular disease.  7. Right elbow x-ray with the following impression:  No joint      effusion.  Possible nondisplaced radial head fracture.  8. Portable chest x-ray from November 17, 2008 on the third day of      admission:  Patchy atelectasis/infiltrate in the left lower lobe      consistent with pneumonia.   HISTORY OF PRESENT ILLNESS:  The patient is an 77 year old woman with  the below past medical history who presents after a fall and complaining  of right arm and hip and leg pain that started 3 days prior to   admission.  She does not recall what happened exactly.  Before the fall  she did feel some lightheadedness and palpitations.  She hit her right  side, hip and arm.   She relates dysuria since 3 days prior to admission.  She was brought to  the ED by family member, her niece.  They are not able to take care of  her.   ADMISSION VITALS:  VITAL SIGNS:  Temperature 98.2, blood pressure of  147/81, pulse of 97, respiratory rate of 20, O2 saturations 95% on room  air.  GENERAL:  The patient is in no acute distress.  EYES:  PERRLA, EOMI.  ENT:  No tonsillar enlargement dry mucous membranes.  NECK:  Supple.  No nuchal rigidity.  RESPIRATORY:  Bilateral air movement intact.  No wheezes, no crackles.  Mild rhonchi.  CARDIOVASCULAR: S1, S2 normal, regular rate and rhythm, plus murmur.  GI:  Bowel sounds normoactive, soft, obese, no rigidity.  No guarding.  EXTREMITIES:  Positive pulses.  MUSCULOSKELETAL:  No swelling or effusion, mild right elbow swelling,  very tender to palpation.  NEURO:  The patient unable to lift right leg due to pain, passive motion  of 4 extremities intact, cranial nerves II-XII intact.  PSYCH:  Appropriate.   ADMISSION LABS:  Sodium 144, potassium 3.8, chloride of 107, bicarbonate  24, BUN of 20, creatinine 1.03, glucose of 202, white blood cell count  13.7, hemoglobin 13.1 up from 9.1 in January 2010, platelet of 205.   UA shows white blood cell count 0 to 2, few bacteria, RBC of 0-2.  The  patient's myoglobin was elevated greater than 500 and her CK-MB was 1.1.   HOSPITAL COURSE:  1. Status post fall with resultant likely right radial head      nondisplaced fracture:  The patient was seen in the ED 3 days after      a fall where she was unable report much history on the      circumstances surrounding it.  The patient had multiple imaging      done and it revealed a possible right radial head fracture.      Orthopedics was consulted and placed the patient in a  posterior  immobilization splint.  The patient's pain was managed with IV      morphine and the patient will be discharged on p.o. Vicodin.  She      will be initially treated with low dose of Vicodin secondary to her      underlying dementia and susceptibility to high-dose narcotics.  I      am the patient's PCP in the outpatient clinic and have seen her on      multiple occasions in the clinic.  It is unfortunate that the      patient lives by herself and has very few relatives or friends in      her life.  She suffers from moderate depression secondary to her      loneliness.  We have consulted the social workers in the clinic on      numerous occasions and have discussed with her the possibility of      assisted-living facility in the past.  However, she has been      resistant to this as well as other potential interventions for her      depression.  For these reasons, now that she has fallen and will      require significant rehabilitation, we are discharging her to a      skilled nursing facility.  It is our hope that after the patient is      suitable for discharge from skilled nursing facility that she will      enter an assisted-living facility as she has significant      instability on her feet requiring a walker and appears to be a      significant risk for future falls.  2. Fever, mild cough, mild leukocytosis and questionable chest x-ray      findings indicative of possible pneumonia:  The patient did report      some mild shortness of breath on the second day of admission and      was admitted with a leukocytosis that was initially attributed to      her urinary tract infection.  The patient became mildly hypoxic      with an O2 sat of 90% on room air at night on the second day of      hospitalization.  For these reasons, a portable chest x-ray was      obtained as the patient was not able to stand for a two view.  The      portable chest x-ray, as above, describes a  potential pneumonia.      For this reason, along with fevers that popped up on the night      prior to discharge, the patient was started on 7-day course of      Avelox, receiving the first day in the hospital.  The patient's      oxygenation was satisfactory upon discharge being in the 93-100      range on room air.  She was also found to be afebrile upon      discharge at 99 degrees.  3. Urinary tract infection:  The patient did complain of some dysuria      upon admission and was found have trace leukocytes in her urine      with the micro showing 0-2 white blood cell count and many squamous      epithelial.  This picture suggests perhaps a mild urinary tract      infection and so the patient was started on  3-day course of Cipro      and completed this while hospitalized.  4. Diabetes:  The patient was switched from her oral glycemic      medications to sliding scale insulin and had her blood sugars      generally well controlled in the 120 to 170 range.  5. Hypertension:  The patient was continued on her home lisinopril and      her blood pressures were very well controlled and generally in the      0000000 systolic range.  6. Dementia:  This along with her depression significantly limits her      ability to function by herself.  For these reasons again, we would      like her to go to an assisted-living facility upon discharge from      skilled nursing facility.   DISCHARGE VITALS:  Temperature 98.9, pulse of 84, blood pressure 129/65,  respiratory rate of 22, O2 sat 93% on room air.   DISCHARGE LABS:  Sodium 135, potassium 3.6, chloride of 107, bicarbonate  20, glucose 117, BUN of 15, creatinine 0.93, white blood cell count of  1.1, hemoglobin of 11.1, platelet of 179.   Please note that the urine culture that was drawn upon admission showed  multiple bacterial morphotypes present but none predominant.   PENDING LABS:  There are no pending labs at this time.      Darlyne Russian, MD  Electronically Signed      Felicity Pellegrini, MD     LW/MEDQ  D:  11/18/2008  T:  11/18/2008  Job:  [id number]

## 2010-10-23 NOTE — Discharge Summary (Signed)
[name], Booth              ACCOUNT NO.:  [id number]   MEDICAL RECORD NO.:  [id number]          PATIENT TYPE:  INP   LOCATION:  70                         FACILITY:  [facility]   PHYSICIAN:  Doroteo Bradford. Hatcher, M.D.DATE OF BIRTH:  July 22, 1923   DATE OF ADMISSION:  06/23/2008  DATE OF DISCHARGE:  06/27/2008                               DISCHARGE SUMMARY   DISCHARGE DIAGNOSES:  1. Viral gastroenteritis.  2. Atypical chest pain, noncardiac, most likely musculoskeletal from      coughing and vomiting.  3. 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  4. History of noncardiac chest pain, status post catheterization in      2003 which showed ejection fraction 55% and mild coronary artery      disease, Myoview in December 2008 showed ejection fraction 67%,      normal left ventricular function, no ischemia.  5. Diabetes, A1c 5.4.  6. Hypothyroidism.  7. Depression.  8. Anxiety.  9. Dementia.  10.Osteoarthritis.  11.Iron deficiency anemia.   DISCHARGE MEDICATIONS:  1. Nitroglycerin 0.4 mg sublingual, place 1 tablet under tongue up to      every 5 minutes x3 doses as needed for chest pain.  Call your      physician if symptoms go away after third dose.  2. Synthroid 25 mcg p.o. daily.  3. Protonix 40 mg p.o. b.i.d.  4. Metformin 500 mg tablets take 2 tablets p.o. b.i.d.  5. Alprazolam 0.25 mg p.o. t.i.d.  6. Premarin 0.625 mg per g cream apply every other day.  7. FEC Plus 100-250-0.025-1 mg tablets (iron-vitamin C-vitamin 123456-      folic acid) 1 tablet by mouth daily.  8. Lyrica 50 mg capsule p.o. t.i.d.  9. Anucort 25 mg suppository PR b.i.d. p.r.n.  10.Remeron 15 mg p.o. nightly.  11.Vitamin B12 1000 mcg p.o. daily.  12.Aricept 5 mg p.o. daily.  13.Antidiarrheal 2 mg capsule (loperamide) 1 tablet b.i.d. p.r.n.      diarrhea.  14.Accu-Chek Lantus.  15.Accu-Chek test strips.   DISCHARGE CONDITION AND FOLLOWUP:  The  patient was discharged in stable  condition.  Her nausea and vomiting and her chest pain and dyspnea  having resolved and improved.  She is to follow up in the [facility] on July 21, 2008, at 1:30 p.m. with Dr. Cletus Gash,  and she will arrive early for a BMET prior to the appointment.  At this  time items that should be addressed are:  1. BMET should be checked for potassium given her chronic diarrhea due      to her carcinoid tumor.  2. The patient should be referred to Dr. Ron Parker of Cardiology for      possible 2-D echo to evaluate for tricuspid valvular damage due to      her carcinoid tumor.  3. She does have iron deficiency anemia, and I asked her to resume      taking her iron.  I believe the most likely source is probably from  her carcinoid tumor and thus additional workup may not be      appropriate since she has refused treatment for this in the past;      however, this should be discussed with her.  4. Apparently, Ms. Plath did not keep her appointment on February 25, 2008, with Dr. Fuller Plan of [facility].  This      appointment was for an EGD to evaluate her complaint of dysphagia.      If she continues to have this complaint and is willing to go for an      EGD, she will need another referral.   PROCEDURES:  CT angiography of the chest dated June 23, 2008, showed  no acute pulmonary embolus, bilateral lower lobe plate-like atelectasis,  and large hiatal hernia.   CONSULTATIONS:  Denice Bors. Stanford Breed, MD, [facility], of Cardiology.   BRIEF ADMITTING HISTORY AND PHYSICAL:  For full details, please see the  hospital chart, but in brief, Alexandra Booth is an 77 year old woman with  past medical history outlined above who presented to the emergency room  with chest pain, dyspnea, and nausea and vomiting and diarrhea.  She had  increasing chest pain that was associated with productive cough that  began approximately 1 week prior to  admission, the pain was substernal  but also in her left flank.  She noted worsening of the pain when she  began to have nausea and vomiting a couple of days later.  She has had  increasing dyspnea over the past few days prior to admission.  At  baseline, she does have dyspnea on exertion, but she has not been able  to walk across the room.  She has had trouble keeping food down for the  past 1-2 days prior to admission.  She had been spending more time in  bed.   PHYSICAL EXAMINATION:  Temperature 98.3, blood pressure 129/72, pulse  88, respirations 21, oxygen saturation 96% on room air.  In general, she  was in no acute distress but anxious appearing during the exam.  She had  no JVD.  Lungs were clear to auscultation bilaterally.  Heart had  regular rate and rhythm with no murmurs.  Bowel sounds were present.  The abdomen was obese, soft, nontender, and without palpable masses.  She had a boil on the left perianal area, which was very tender without  fluctuance or induration and it appeared to have drained recently.  She  had trace pedal edema in both lower extremities.  Her neurologic exam  was nonfocal.  She was anxious-appearing and did repeat herself at  times.   Sodium 145, potassium 3.6, chloride 108, bicarbonate 25, BUN 22,  creatinine 0.9, glucose 142.  White blood count 6.1, hemoglobin 10.8,  platelets 242, MCV was 100.3.  D-dimer was 1.54.  BNP was less than 30.  Point-of-care cardiac markers were negative.  CT angiography was  obtained with the results as described above.  Chest x-ray showed  chronic lung changes and no active disease.  EKGs showed nonspecific ST-  T changes, but no signs of acute ischemia.   HOSPITAL COURSE:  1. Viral gastroenteritis:  The patient was treated with IV fluids for      hydration and was started on a clear diet that she was hungry even      down in the emergency room.  Her diet was advanced as tolerated.      She did have some continued  vomiting  on her second and third      hospital days.  However, this gradually resolved and she was      tolerating a full diet by the time of discharge.  Although her      nausea and vomiting were new complaints, her diarrhea is, in fact,      an old issue related to her carcinoid tumor and she continues to      refuse surgery or any treatment for this.  She has tried Imodium in      the past without much luck and does not think it will be very      likely to help her; however, she was given a prescription for this      at discharge if she chooses to use it.  2. Atypical chest pain:  She was initially ruled out for acute      myocardial infarction with negative serial cardiac enzymes and      EKGs.  She was evaluated by Dr. Stanford Breed of Cardiology who felt      that her pain was most likely not cardiac in origin but rather      musculoskeletal, most likely as a result of her cough as well as      her nausea and vomiting.  He recommended followup with Dr. Ron Parker who      had seen her in the past for further evaluation, and in particular      an echocardiogram as an outpatient given her history of carcinoid      to rule out tricuspid regurgitation.  3. Depression and anxiety and dementia:  The patient was continued on      her home medications of Remeron and Xanax for her depression and      anxiety as well as Aricept for dementia.  At times, she was      uncooperative with staff and occasionally seemed to be confused      about the timing of recent events.  Much of this seemed to improve      as her acute viral illness resolved.  She was up on her own,      getting out of bed, and cleaning of her room in anticipation of      having visitors on the day of discharge.  Although she did have      some complaints of depression noting the relatively recent losses      of her husband and son, I noted that she had been taken off      Cymbalta at her last clinic appointment and she was discharged       without any additional antidepressant medication.  She is to follow      up with Dr. Cletus Gash in the outpatient clinic as mentioned above.  4. Iron deficiency anemia:  The patient's anemia panel showed normal      B12 and folate levels with iron 75, total iron binding capacity      354, percent saturation 21%, and ferritin 36 consistent with      borderline iron deficiency anemia.  She was encouraged to take      iron, which is already listed on her medication list and given a      prescription for this.  We could consider additional workup for the      source of her iron deficiency, although it is most likely due to      her carcinoid tumor for which she already  refused the surgery, so      we should discuss with the patient whether she is amendable to any      additional workup before proceeding.   DISCHARGE LABORATORY DATA AND VITAL SIGNS:  Temperature 97.5, pulse 75,  respirations 18, blood pressure 106/57, oxygen saturation 99% on room  air.  White blood count 6.1, hemoglobin 9.1, MCV 100.9, platelets 207.  Sodium 141, potassium 3.4 (Please note, the patient received 2 doses of  potassium chloride 40 mEq each after the BMET drawn), chloride 112,  bicarbonate 21, glucose 106, BUN 3, creatinine 0.75, calcium 7.9.  Methylmalonic acid was 197.      Lajean Saver, MD  Electronically Signed      Doroteo Bradford. Johnnye Sima, M.D.  Electronically Signed    PN/MEDQ  D:  06/28/2008  T:  06/29/2008  Job:  [id number]   cc:   Darlyne Russian, MD

## 2010-10-26 NOTE — Discharge Summary (Signed)
NAME:  Alexandra Booth, Alexandra Booth                        ACCOUNT NO.:  [id number]   MEDICAL RECORD NO.:  [id number]                   PATIENT TYPE:  INP   LOCATION:  [id number]                                 FACILITY:  [facility]   PHYSICIAN:  Thomes Lolling, M.D.                 DATE OF BIRTH:  07/21/1923   DATE OF ADMISSION:  01/01/2003  DATE OF DISCHARGE:  01/04/2003                                 DISCHARGE SUMMARY   DISCHARGE DIAGNOSES:  1. Chest pain.  2. Hypertension.  3. Hyperlipidemia.  4. Hypothyroid.  5. Gastroesophageal reflux disease.   DISCHARGE MEDICINES:  1. Aspirin 325 mg p.o. daily.  2. Hydrochlorothiazide 25 mg p.o. daily.  3. Synthroid 25 mcg p.o. daily.  4. Lisinopril 10 mg p.o. daily.  5. Metoprolol 25 mg p.o. b.i.d.  6. Protonix 40 mg p.o. b.i.d.  7. Zocor 40 mg p.o. daily.  8. Sucralfate 1 g p.o. daily.  9. Tylenol 650 mg p.o. q.4 h. p.r.n. for pain.  10.      Nitroglycerin 0.4 mg sublingual q.5 min. x3 p.r.n. for chest pain.  11.      Glipizide 12.5 mg p.o. daily.   FOLLOWUP:  A followup appointment was made with Dr. Mamie Laurel at  the [facility] and an appointment was made for the patient  with a followup with the outpatient diabetes education coordinator.   PROCEDURES:  1. Portable chest x-ray on January 01, 2003.  Impression:  Left basilar     scarring.  No evidence of acute disease.  2. PA and lateral chest x-ray on January 01, 2003.  Impression:  Stable exam.     Atelectatic change versus scar at left base.  3. Upper gastrointestinal on January 04, 2003.  Impression:  Probable stricture     of the lower cervical esophagus versus very prominent cricopharyngeus     muscle.  Moderate-sized sliding hiatus hernia with marked dyskinesia and     tertiary contractions of the esophagus.  No reflux, stricture or     esophagitis demonstrated, filling defect or deficits in the duodenal     bulb.   BRIEF HISTORY AND PHYSICAL FROM January 01, 2003:   HISTORY  OF PRESENT ILLNESS:  The patient is a 77 year old African American  female with past medical history significant for coronary artery disease,  hypertension, hyperlipidemia, who presents with acute-onset chest pain and  shortness of breath.  The patient states that while sitting down looking at  picture album, had sudden onset of chest pain.  At that time, she laid down  on the bed and chest pain got worse, radiating to left arm and wrist.  Chest  pain on the left side, midclavicular area, described as sharp pain (8 to 9  out of 10) associated with shortness of breath, diaphoresis and nausea.  She  attributes onset of pain to frustration while looking for a picture.  Her  son called and was told about chest pain.  Son called 911.  In transit, the  patient received nitroglycerin/aspirin/O2.  Chest pain got better after  nitroglycerin/aspirin/O2.  Currently, patient says chest pain is better  (5/10).  Patient also states that she got out of bed to eat spaghetti and  meatballs and the pain got worse, so she could not eat any more.  She  stopped eating and laid back down, and got short of breath and diaphoretic.  She has not used the nitroglycerin more than once per month prior to today.  The patient also describes two to three days of stomach pain above the pubis  and pain while walking.   ALLERGIES:  Allergies to PENICILLIN that causes itch and rash.   PAST MEDICAL HISTORY:  Past medical history significant for:  1. Hypertension.  2. Hyperlipidemia.  3. Coronary artery disease.  4. Hypothyroidism.  5. Gastroesophageal reflux disease.  6. History of hiatal hernia.  7. History of chronic dizziness.  8. History of depression.  9. History of anxiety.  10.      Status post tonsillectomy.  11.      History of low back pain with radiculopathy, status post     laparoscopic cholecystectomy in 2002.  12.      Status post bilateral L3, L4, L5 decompression and laminectomy in     2001.  52.       A cardiac catheterization in 1997 showed:  #1 - Left main coronary     artery:  Mild irregularities to LAD, trivial.  #2 - First diagonal:  Mild     disease.  #3 - Left circumflex:  Two obtuse marginal branches -- mild     disease.  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  #5 - RCA:  Dominant.  Luminal irregularity.  #6 - Left     ventricular EF greater than 55%.  No wall motion abnormalities.     Impression:  Mild CAD.  14.      Cardiac catheterization from January 2003.  Impression:  Mild     coronary artery disease, well-preserved left ventricular function, EF of     55%.   MEDICATIONS ON ADMISSION:  1. Hydrochlorothiazide 25 mg daily.  2. Lopressor 25 mg p.o. daily.  3. Synthroid 25 mcg p.o. daily.  4. Ativan 1 mg p.o. nightly.  5. Protonix 40 mg daily.  6. Wellbutrin 200 mg p.o. q.a.m. and 100 mg p.o. q.p.m.  7. Tylenol 650 mg p.o. q.6 h. p.r.n.  8. Lipitor 20 mg p.o. daily.  9. Lisinopril 10 mg p.o. daily.   HABITS:  The patient had a significant substance history of being a former  smoker of one pack per day for five years while she was a teenager.   SOCIAL HISTORY:  She is divorced x2, had a ninth-grade education, worked in  [employer] fund[employer].  The patient lives in [place] with a roommate and foster  daughter.   FAMILY HISTORY:  Her family history was significant for her mother who died  at age 19 with a history of diabetes and father died in his 47s with MI and  kidney problems.  She has one son with kidney problems that requires  dialysis and kidney transplant.   REVIEW OF SYSTEMS:  The patient denied fever, chills, weight loss, nose  bleed, sore throat, syncope, cough, sputum, hemoptysis, vomiting, diarrhea,  constipation and hematemesis.  Review of systems is positive  for chest pain,  palpitations, dyspnea, orthopnea, dyspnea on exertion, nausea, dysphagia,  abdominal pain, dysuria, urinary frequency and weakness.   PHYSICAL  EXAMINATION:  VITAL SIGNS:  Pulse 81, BP 112/55, temperature 97.6,  respiratory rate 22, O2 saturation 99%.  GENERAL:  In pain.  EYES:  Pupils equal, round and reactive to light and accommodation;  extraocular muscles are intact bilaterally.  ENT:  Oropharynx clear.  NECK:  Supple, no JVD, no bruits.  LUNGS:  Clear to auscultation bilaterally.  CARDIOVASCULAR:  Regular rate and rhythm, no murmurs, gallops or rubs.  CHEST:  Pain to palpation, subxiphoid and along midclavicular line on chest.  GI:  Positive bowel sounds.  Nondistended, tender to palpation in bilateral  suprapubic area.  EXTREMITIES:  Dorsalis pedis pulses 2+.  No ankle edema.  SKIN:  No lesions.  LYMPH:  No cervical lymphadenopathy.  MUSCULOSKELETAL:  Muscle strength 5/5 in bilateral upper and lower  extremities.  NEUROLOGIC:  Cranial nerves II-XII intact.  PSYCHIATRIC:  Oriented x4.   LABORATORIES ON ADMISSION:  Pro time 13.3, INR 1.0, PTT 31.  I-  STAT/electrolytes:  PCO2 26.9, pH 7.536, bicarb 23.0, TCO2 24, acid base  excess 1, hemoglobin 14.0, hematocrit 41.0, sodium 139, potassium 3.8,  chloride 107, glucose 129, BUN 18.  I-STAT creatinine 1.5.  Cardiac markers  on January 01, 2003 at 1:33:  CK-MB 1.7, troponin I less than 0.05, myoglobin  159.  Cardiac markers on January 01, 2003 at 2:46:  CK-MB 1.1, troponin I of  less than 0.05, myoglobin 105.  Alcohol less than 5.  Cardiac markers on  January 01, 2003 at 4:10:  CK-MB 1.6, troponin I less than 0.05, myoglobin 128.  Urinalysis:  Color yellow, appearance cloudy, specific gravity 1.023, pH  5.5, glucose negative, bilirubin negative, ketones 15, blood negative,  protein negative, urobilinogen 1, nitrite negative, leukocytes small.  Urine  drug screen was negative.  Microscopic urine was significant for many  squamous epithelials per low-power field, casts per high-power field and  many bacteria.  Cardiac enzymes from January 01, 2003 at 10:18:  Creatine  kinase total 128,  CK-MB 1.8, relative index 1.5, troponin I 0.01.  TSH  2.204.  Lipid profile:  Cholesterol 167, triglycerides 61, HDL 60, total  cholesterol/HDL ratio 2.8, cholesterol LDL 95.  CBC with differential:  WBC  7.3, RBC 3.73, hemoglobin 12.8, hematocrit 37.6, MCV 100.7, MCHC 34, RDW 12,  platelet count 246,000; neutrophils percent 54, lymphocytes percent 34,  monocytes percent 8, eosinophils percent 4, basophils percent 0; neutrophils  absolute 3.9, lymphocytes absolute 2.5, monocytes absolute 0.6, eosinophils  absolute 0.3, basophils absolute 0.0.  Comprehensive metabolic profile:  Sodium 136, potassium 3.5, chloride 107, CO2 22, glucose 103, BUN 17,  creatinine 0.9, bilirubin total 0.6, alkaline phosphatase 72, SGOT 22, SGPT  19, total protein 7.1, albumin 3.4, calcium 9.1.  ABG:  FIO2 of 0.21,  _______ 37.0, pH 7.468, PCO2 31.5, PO2 96.3, bicarb 24.5, PCO2 23.5, base  deficit 0.8, oxygen saturation 97.7.  The patient was admitted with chest pain.   HOSPITAL COURSE:  PROBLEM #1 - CHEST PAIN:  The patient's chest pain was  thought to be atypical with features suggestive of a GI etiology, however,  she did have risk factors including hypertension, increased lipids and  therefore was admitted to rule out MI with cardiac enzymes and EKGs.  She  was started on aspirin, her beta blocker was continued and her ACE inhibitor  was continued.  She was given nitroglycerin  p.r.n. for chest pain; she was  also started on a proton pump inhibitor.  Her lipids were rechecked for  stratification and old catheterization records showed no disease significant  for intervention.  The patient had no cardiac enzyme changes or EKG changes  suggestive of ischemic disease.  It was therefore thought that her pain was  likely secondary to a GI etiology.  As such, a barium swallow and upper GI  were obtained.  The patient's upper GI showed gastroesophageal reflux  disease and spasm that likely explains her chest symptoms.   She was  discharged on a proton pump inhibitor.   PROBLEM #2 - HYPERTENSION:  The patient presented with a BP of 112/55.  Her  beta blocker and ACE inhibitor and hydrochlorothiazide were continued during  her hospitalization.   PROBLEM #3 - HYPERLIPIDEMIA:  The patient was continued on her Lipitor and  her fasting lipid profile was examined for further risk stratification.  She  was discharged on her statin.   PROBLEM #4 - HYPOTHYROID:  The patient's TSH was within normal limits and  she was continued on Synthroid throughout her hospitalization and discharged  with this medicine.   PROBLEM #5 - GASTROESOPHAGEAL REFLUX DISEASE:  The patient was given  Protonix and discharged with this medicine.   DISCHARGE LABORATORIES:  BMP:  Sodium 139, potassium 3.8, chloride 104, CO2  25, glucose 114, BUN 21, creatinine 1.0, calcium 9.5.  CBC:  WBC 7.1, RBC  3.78, hemoglobin 12.6, hematocrit 37.6, MCV 99.4, MCHC 33.7, RDW 11.8,  platelet count 234,000.      Mamie Laurel, MD                   Thomes Lolling, M.D.    CW/MEDQ  D:  04/26/2003  T:  04/26/2003  Job:  [id number]   cc:   [facility]

## 2010-10-26 NOTE — Cardiovascular Report (Signed)
[place]. [facility]  Patient:    Alexandra Booth, Alexandra Booth Visit Number: [id number] MRN: [id number]          Service Type: MED Location: [id number] Attending Physician:  Ernestine Mcmurray Dictated by:   Christy Sartorius, M.D. Proc. Date: 06/25/01 Admit Date:  06/24/2001   CC:         Terald Sleeper, M.D. [facility]  Gretchen ______, M.D., [facility] Teaching Service   Cardiac Catheterization  PROCEDURES PERFORMED: 1. Left heart catheterization. 2. Left ventriculogram. 3. Selective coronary angiography. 4. Attempted Perclose placement.  DIAGNOSES: 1. Mild coronary artery disease by angiogram. 2. Normal left ventricular systolic function. 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  INDICATIONS: The patient is a 77 year old, black female, with a history of mild coronary artery disease, who presents with substernal chest discomfort. She has had exertional and rest symptoms that appear to have been progressive and she was admitted to the hospital. She ruled out for acute myocardial infarction and she presents now for further assessment.  TECHNIQUE: After informed consent was obtained, the patient was brought to the cardiac catheterization lab where a 6 French sheath was placed in the right femoral artery. Left heart catheterization, left ventriculogram, and selective coronary angiography were then performed using preformed 6 French Judkins catheters. An attempt was made to deploy a Perclose suture closure device. However, adequate hemostasis could not be achieved and manual pressure was applied until hemostasis was achieved. She was then transferred to the ward in stable condition.  She tolerated the procedure well.  FINDINGS: Findings are as follows: 1. Left main trunk: The left main trunk is a large caliber vessel with    mild irregularities. 2. LAD: This is a medium caliber vessel that provides the trivial first    diagonal branch in the proximal segment. The LAD system  has mild    irregularities of 10-20% in the distal segment. 3. Left circumflex artery: The left circumflex artery is a large caliber    vessel that provides two marginal branches. There are mild irregularities    of 20% in the left circumflex system. 4. Ramus intermedius: This is a large caliber vessel that bifurcates in its    distal segment. It has mild irregularities. It appears to provide flow    to the LV cavity via a fistula. 5. Right coronary artery: Dominant. This is a medium caliber vessel that    provides the posterior descending artery in its terminal segment.    There are luminal irregularities in the right coronary system.  LEFT VENTRICULOGRAM: Normal end-systolic and end-diastolic dimensions. Overall left ventricular function is well preserved, ejection fraction of greater than 55%.  No mitral regurgitation. LV pressure is 120/0, aortic equals 120/53, LVEDP equals 17.  ASSESSMENT AND PLAN: The patient is a 77 year old, black female, with mild coronary artery disease and well preserved left ventricular function. Continued medical therapy should be pursued. The patient has evidence of left ventricular fistula that does not appear to be significant. Dictated by:   Christy Sartorius, M.D. Attending Physician:  Ernestine Mcmurray DD:  06/25/01 TD:  06/26/01 Job: [id number]

## 2010-10-26 NOTE — Discharge Summary (Signed)
[place]. [facility]  Patient:    Alexandra Booth, Alexandra Booth Visit Number: [id number] MRN: [id number]          Service Type: MED Location: [id number] Attending Physician:  Ernestine Mcmurray Dictated by:   Arn Medal, P.A.C. Admit Date:  06/24/2001 Discharge Date: 06/26/2001   CC:         Idamae Schuller, M.D.  Terald Sleeper, M.D.   Discharge Summary  DISCHARGE DIAGNOSES: 1. Chest pain, status post cardiac catheterization. 2. Hypothyroidism. 3. History of hiatal hernia. 4. History of depression. 5. History of chronic dizziness. 6. History of low back pain with radiculopathy.  HOSPITAL COURSE:  Alexandra Booth is a pleasant 77 year old African-American female.  She had a history of catheterization in 1997 by Dr. Lia Foyer which showed 30 to 50% left main disease.  She had a recent history of approximately one month of substernal chest pain which appeared to occur both at rest and on exertion.  Her pattern had increased over the previous few days with episodes lasting 15 to 20 minutes.  She saw Idamae Schuller, M.D. who gave the patient nitroglycerin as well as Plavix.  She was seen and admitted by Terald Sleeper, M.D.  Dr. Dannielle Burn felt that given the patients history, she would need a relook cardiac catheterization.  On January 16, the patient was taken to the catheterization lab by Christy Sartorius, M.D. [facility].  CATHETERIZATION RESULTS: 1. Left main coronary artery; mild irregularities. 2. Left anterior descending; trivial first diagonal, mild disease. 3. Left circumflex; two obtuse marginal branches, mild disease. 4. Ramus intermedius; trivial irregularities, fistula to the left ventricle. 5. RCA; dominant, luminal irregularities. 6. Left ventricle; ejection fraction greater than 55%, no wall motion    abnormality.  Dr. Lyndel Safe felt the patient had mild coronary artery disease and recommended that other etiologies for her chest pain be persued.  The next day, the  patient was doing well and had no further chest pain and shortness of breath and was felt to be stable for discharge.  DISCHARGE MEDICATIONS: 1. Ativan as needed for sleep. 2. Prilosec 20 mg q.d. 3. Meclozine 25 mg t.i.d. p.r.n. 4. The patient is to resume other home medicines as previously taken. 5. The patient is to discontinue taking Plavix.  LABORATORY DATA:  Sodium 139, potassium 3.6, chloride 107, CO2 28, BUN 18, creatinine 0.8, glucose 127.  White count 5.6, hemoglobin 13.3, hematocrit 39.0, platelets 235, MCV 100.3, MCHC 34.2, RDW 11.8.  TSH 0.711, free T4 1.08. Serial cardiac enzymes were negative for MI.  Portable chest x-ray on admission showed severe degenerative changes in the left shoulder.  Lungs were clear of acute disease and heart size was normal.  Electrocardiogram showed normal sinus rhythm at 84.  There was noted to be a left axis deviation.  PR interval 153, QRS 83, QTC 453, axis -39.  DISCHARGE INSTRUCTIONS:  The patient is to avoid driving, heavy lifting, or tub baths for 48 hours.  She is to follow a low fat, low cholesterol diet. She is to watch the catheterization site for any pain, bleeding, or swelling, and to call the [facility] for any of these problems.  FOLLOW-UP:  She is to follow up with Idamae Schuller, M.D. and call for an appointment in the week after discharge. Dictated by:   Arn Medal, P.A.C. Attending Physician:  Ernestine Mcmurray DD:  06/26/01 TD:  06/28/01 Job: [id number]

## 2010-10-26 NOTE — Assessment & Plan Note (Signed)
[place] OFFICE NOTE   MAESON, GRIFFON                     MRN:          [id number]  DATE:06/15/2006                            DOB:          [birth year]    Alexandra Booth is seen for follow-up.  See my note of May 20, 2007.  At that time we decided to do a Myoview scan to be sure about her chest  discomfort.  This study is normal.  She has normal wall motion.  There  is no sign of scar or ischemia.   Today, I am seeing her back in follow-up.  She has chronic chest wall  pain.  I believe that it is not cardiac.  I have chosen to proceed with  no further cardiac workup.  I have encouraged her to see her primary  physicians back.   PAST MEDICAL HISTORY:   ALLERGIES:  PENICILLIN, SULFA AND CODEINE.   MEDICATIONS:  28yrica, metformin, aspirin, Cymbalta, Imdur, Xanax,  Synthroid, Protonix, multivitamin,  Aricept, lisinopril and fluoxetine.   OTHER MEDICAL PROBLEMS:  See the list on my note of May 20, 2007.   REVIEW OF SYSTEMS:  Other than the HPI, the review of systems is  negative.   PHYSICAL EXAMINATION:  Blood pressure is 127/62.  Pulse is 87.  The  patient is oriented to person, time and place.  She appears fatigued,  but stable.  Lungs are clear.  Cardiac exam reveals S1-S2.  There are no clicks or significant murmurs.   Problems are listed on my note of May 20, 2007.  Her cardiac status  is stable.  No further workup.     Carlena Bjornstad, MD, [facility]  Electronically Signed    JDK/MedQ  DD: 06/16/2007  DT: 06/16/2007  Job #: [id number]   cc:   Mohammed Kindle, M.D.

## 2010-10-26 NOTE — H&P (Signed)
[place]  Patient:    Alexandra Booth, Alexandra Booth Visit Number: [id number] MRN: [id number]          Service Type: Attending:  Terald Sleeper, M.D. [facility] Dictated by:   Terald Sleeper, M.D. [facility] Adm. Date:  06/24/01   CC:         Lucendia Herrlich, M.D.                         History and Physical  DATE OF BIRTH:  08/26/1923  REFERRING PHYSICIAN:  Lucendia Herrlich, M.D.  CURRENT COMPLAINTS:  Substernal chest pain and palpitations.  HISTORY OF PRESENT ILLNESS:  The patient is a 77 year old African-American female with a history of coronary artery disease, status post cardiac catheterization in 1997 by Loretha Brasil. Lia Foyer, M.D.  The patient is now seen in the office today after referral by Lucendia Herrlich, M.D., due to new onset substernal chest pain.  The patient reports to me approximately a one-month history of substernal chest pain which appears to be occurring both at rest and on exertion.  Her pattern of chest pressure, however, increased over the last few days with prolonged episodes of substernal tightness last evening lasting 15-20 minutes.  The patient reports radiation of pain to the left shoulder and left arm, as well as associated shortness of breath.  The patient was given nitroglycerin by Lucendia Herrlich, M.D., but was afraid to take this due to the fact that it is giving her a headache.  She also reports palpitations for approximately one month, sometimes lasting 10-15 minutes, associated with dizziness, and occurring at erratic times.  She reports no orthopnea or PND.  She is not clear in the history that her symptoms of chest pain are in a crescendo pattern.  ALLERGIES:  PENICILLIN and ASPIRIN.  MEDICATIONS: 1. Plavix 75 mg a day, which was started by Lucendia Herrlich, M.D., at the    last clinic visit due to the patients allergy to aspirin. 2. Protonix 40 mg a day. 3. Protonix 40 mg a day. 4. Citrucel. 5. Lorazepam.  PAST  MEDICAL HISTORY: 1. Cardiac catheterization in 1997 by Loretha Brasil. Lia Foyer, M.D., with    30-50% left main disease ______ contemplated, but no performed at that    time.  Otherwise nonobstructive CAD.  This was followed by Persantine    Cardiolite, which was within normal limits. 2. Hypothyroidism. 3. Status post tonsillectomy. 4. History of hiatal hernia. 5. History of depression. 6. History of chronic dizziness. 7. History of low back pain with radiculopathy.  SOCIAL HISTORY:  The patient lives in [place], [place].  She has moved from [place].  She lives with a roommate and her son, who is on hemodialysis.  She is a former Scientist, forensic.  She used to smoke tobacco, but quit 30 years ago.  She does not drink alcohol or use drugs or herbal medication.  FAMILY HISTORY:  Noncontributory.  REVIEW OF SYSTEMS:  No fevers, chills, or sweats.  She complains of headaches and vertiginous symptoms occurring at night when turning over on pillow. Skin:  No rash or lesions.  Cardiopulmonary:  Chest pain, shortness of breath, and dyspnea on exertion as outlined above.  Palpitations.  No claudication, cough, or wheezing.  No frequency or dysuria.  No weakness or numbness.  No myalgias or arthralgias.  No nausea or vomiting.  No polyuria or polydipsia.  PHYSICAL EXAMINATION:  VITAL SIGNS:  Blood pressure 128/70, heart rate 97 beats  per minute, respirations 18 per minute.  GENERAL APPEARANCE:  No distress.  HEENT:  NCAT.  PERRLA.  EOMI.  Sclerae clear.  NECK:  Supple.  No bruit or JVD.  LYMPHADENOPATHY:  None.  CARDIOVASCULAR:  Regular rate and rhythm.  Normal S1 and S2.  No S3 or S4. PMI nondisplaced.  LUNGS:  Clear breath sounds bilaterally without rhonchi.  SKIN:  No rash or lesions.  ABDOMEN:  Soft and nontender.  No rebound or guarding.  GENITOURINARY:  Deferred.  RECTAL:  Deferred.  EXTREMITIES:  No clubbing, cyanosis, or edema.  MUSCULOSKELETAL:  No joint  deformities.  NEUROLOGIC:  Patient alert and oriented.  Grossly nonfocal.  LABORATORY DATA:  The chest x-ray is pending.  EKG with normal sinus rhythm, heart rate 97 beats per minute, normal axis, and nonspecific T-wave changes. Labs pending.  IMPRESSION AND PLAN: 1. Substernal chest pain.  The patient has multiple risk factors for coronary    artery disease.  Her chest pain is rather concerning for an acute coronary    syndrome.  She had a prolonged episode of substernal chest pain last night.    Due to her history of a left main 30-50% lesion, we have decided to admit    the patient for cardiac catheterization tomorrow.  She will be given    nitroglycerin, Plavix, and beta blocker therapy. 2. Vertigo.  This is secondary to benign positional vertigo.  The patient can    receive Antivert.  If the symptoms of headache and vertigo continue, she    may need a CT scan prior to hospitalization. 3. History of hypothyroidism.  Will recheck a TSH level.  DISPOSITION:  The patient will be admitted from the office and will be brought by ambulance to [facility]. [facility].  The cardiac catheterization has been scheduled for tomorrow. Dictated by:   Terald Sleeper, M.D. [place] Attending:  Terald Sleeper, M.D. [facility] DD:  06/24/01 TD:  06/24/01 Job: [id number]

## 2010-10-26 NOTE — Op Note (Signed)
[facility]  Patient:    Alexandra Booth, Alexandra Booth                     MRN: [id number] Proc. Date: 11/18/00 Adm. Date:  [id number] Attending:  Harlin Rain CC:         Darrick Penna Swords, M.D. [facility]   Operative Report  PREOPERATIVE DIAGNOSES:  Cholelithiasis, cholecystitis.  POSTOPERATIVE DIAGNOSES:  Cholelithiasis, cholecystitis.  PROCEDURE:  Laparoscopic cholecystectomy.  SURGEON:  Shellia Carwin, M.D.  ASSISTANT:  Orson Ape. Rise Patience, M.D.  ANESTHESIA:  General endotracheal.  CLINICAL SUMMARY:  A 77 year old retired woman with abdominal symptoms.  She has had multiple symptoms, including nausea, vomiting, and abdominal pain. She has had gallbladder ultrasound showing stones.  A small infected cyst was drained on her abdomen by myself before surgery.  Her liver function studies are normal.  We feel that laparoscopic cholecystectomy is indicated.  She is a diabetic, and her blood sugar today is 144.  OPERATIVE FINDINGS:  She had multiple adhesions from her previous surgery in her lower abdomen and pelvis.  They did not affect the gallbladder.  The gallbladder was thin-walled and had multiple medium-sized stones.  The cystic duct and artery were normal in anatomy and size.  DESCRIPTION OF PROCEDURE:  Under satisfactory general endotracheal anesthesia, having received 1.0 g Ancef preop, the patients abdomen was prepped and draped in a standard fashion.  A transverse incision made above the umbilicus and carried down through the midline into the peritoneum.  Controlled with a figure-of-eight 0 Vicryl suture, an operating Hasson port inserted, secured, and good CO2 pneumoperitoneum established.  Camera placed and under direct vision, through Marcaine-infiltrated sites, two #5 ports placed laterally and a second #10 port medially.  Graspers through the lateral port gave excellent exposure and operating through the medial port using a combination  of dissectors and right angles, the cystic duct-gallbladder junction was identified.  Then the cystic duct and artery were each carefully identified and dissected circumferentially.  When satisfied with the anatomy, they were controlled with multiple clips on the stay side and a single clip distally and then cut.  The gallbladder was then removed from below upward, using the coagulating spatula for hemostasis and dissection.  After severing the gallbladder, the liver bed was lavaged with saline and made dry by cautery. Saline was then suctioned away and the abdomen carefully examined, and there was no evidence of leak or complication.  Camera moved to the upper port and through the umbilicus, a large grasper used to remove the gallbladder.  Because it had several large stones, it was opened externally and the stones retrieved and then the gallbladder removed without any spillage or complication.  The ports were then removed under direct vision and CO2 released.  The midline closed with the previous figure-of-eight as well as two interrupted 0 Vicryl sutures.  The umbilicus was then infiltrated with Marcaine and then the subcutaneous tissue was approximated with 4-0 Vicryl and skin edges approximated with Steri-Strips.  There were no complications.  The sponge and needle counts were correct.  She went to the recovery room in good condition. DD:  11/18/00 TD:  11/18/00 Job: [id number]

## 2010-10-26 NOTE — Op Note (Signed)
[place]  Patient:    Alexandra Booth, Alexandra Booth                       MRN: [id number] Proc. Date: 02/05/00 Adm. Date:  [id number] Attending:  Jenelle Mages                           Operative Report  PREOPERATIVE DIAGNOSIS:  Lumbar stenosis L3-L5.  POSTOPERATIVE DIAGNOSIS:  Lumbar stenosis L3-L5.  OPERATION: 1. Bilateral L3, L4, and L5 decompressive laminectomy with operative    microscope. 2. Microdissection L3-4 disk and L4-5 disk and L4-5 nerve roots.  SURGEON:  Faythe Ghee, M.D.  ASSISTANT:  Julien Girt. Guy Begin., MD.  DESCRIPTION OF PROCEDURE:  After being placed in the prone position, the patients back was prepped and draped in the usual sterile fashion.  A localizing x-ray was taken prior to incision to identify the appropriate level.  A midline incision was made over the spinous process of L3, L4 and L5. Using the Bovie cautery and curet, the incision was carried down to the spinous processes.  Subperiosteal dissection was then carried out bilaterally on the spinous processes and lamina and the McCullough self-retaining retractor was placed for exposure.  A second x-ray showed approach at the appropriate level.  The spinous processes were removed with a leksell rongeur. The high speed drill was used at L3-4 to thin down the inferior one half of the L3 lamina, the superior one half of the L4 lamina and the medial one third of the facet joint bilaterally.  Residual bone and markedly hypertrophic ligamentum flavum removed in a piece meal fashion to decompress the underlying spinal dura.  Proximal foraminal decompression was carried out.  A similar decompression was then carried out at L4-5 once again removing the remainder of the L4 lamina, the superior one half of the L5 lamina and the medial one third of the facet joint.  Once again residual bone and any residual ligamentum flavum removed in a piece meal fashion.  At this point the  microscope was brought in to examine the L3-4, and L4-5 disks.  Using microdissection technique they were identified, and found not be herniated and in no need of removal. At this point the nerve roots were evaluated and all found to be well decompressed.  At this point large amounts of irrigation were carried out and any bleeding controlled with bipolar coagulation with Gelfoam.  A medium Hemovac drain was left in the epidural space and brought out through a separate stab wound incision.  The wound was then closed using interrupted Vicryl in the muscle, fascia, and subcutaneous and subcuticular tissues and staples on the skin.  A sterile dressing was then applied.  The patient was extubated and taken to the recovery room in stable condition. DD:  02/05/00 TD:  02/06/00 Job: [id number]

## 2010-10-26 NOTE — Discharge Summary (Signed)
NAME:  Alexandra Booth, Alexandra Booth                        ACCOUNT NO.:  [id number]   MEDICAL RECORD NO.:  [id number]                   PATIENT TYPE:  INP   LOCATION:  [id number]                                 FACILITY:  [facility]   PHYSICIAN:  Delphina Cahill, M.D.                     DATE OF BIRTH:  [birth year]   DATE OF ADMISSION:  10/18/2002  DATE OF DISCHARGE:  10/22/2002                                 DISCHARGE SUMMARY   DISCHARGE DIAGNOSES:  1. Community-acquired pneumonia with cough.  2. Hypothyroidism.  3. Anxiety.  4. Hypokalemia.  5. Gastroesophageal reflux disease.  6. Hyperlipidemia.  7. Hypertension.   DISCHARGE MEDICATIONS:  1. *Dyazide 37.5/25 mg p.o. daily.  2. Avalox 400 mg p.o. daily times five days.  3. Robitussin-DM 5 mL q.4h. p.r.n.  4. Lopressor 25 mg p.o. daily.  5. Synthroid 25 mcg p.o. daily.  6. Ativan 1 mg p.o. q.h.s.  7. Protonix 40 mg p.o. daily.  8. Wellbutrin 200 mg p.o. q.a.m., 100 mg p.o. q.p.m.  9. Tylenol 650 mg p.o. q.6h. p.r.n. for pain.  10.      Lipitor 20 mg p.o. daily.   CHIEF COMPLAINT:  Cough, chest pain, shortness of breath.   HISTORY OF PRESENT ILLNESS:  This is a 77 year old black female with a past  medical history significant for mild coronary artery disease, induced  hypothyroidism, depression, low back pain, and diverticulitis who presents  to the emergency department with a three-day history of worsening cough,  shortness of breath, production of white to yellow sputum, muscle aches, and  sore throat.  Has felt febrile and had occasional chills, decreased  appetite, but good fluid intake.  She took one nitroglycerin for her chest  pain approximately three days prior to admission.  Describes the pain as  sharp, through to her back when she coughs; no real radiation into her neck  or shoulders.  States that she is sore all over.   ALLERGIES:  PENICILLIN and A SENSITIVITY TO ASPIRIN.   PAST MEDICAL HISTORY:  1. Cath in January of 2003, mild  coronary artery disease, well preserved     LVF, EF of 55%.  2. Hypothyroidism.  3. Status post tonsillectomy.  4. History of hiatal hernia.  5. History of depression.  6. History of chronic dizziness.  7. Low back pain, L3-L5 decompression laminectomy and microdiskectomy in     01/2000.  8. Cholecystectomy, 2002.   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  She is on Medicaid.  She has a roommate and a  foster daughter who live with her.   FAMILY HISTORY:  Mother died at age 51 and had diabetes, heart disease, and  kidney problems.  Father died in his 16s of heart problems and kidney  problems.  Has a son who has kidney problems, requiring  dialysis and a  kidney transplant.   PHYSICAL EXAMINATION:  VITAL SIGNS:  On admission are pulse 104, blood  pressure 124/74, temperature of 102.3, respirations of 26, O2 sat of 95% on  room air.  GENERAL:  An elderly black female in no acute distress, well nourished.  EYES:  Pupils are equal, round, and reactive to light and accommodation.  No  scleral icterus.  ENT:  No sinus tenderness.  Oropharynx is clear.  NECK:  No thyromegaly.  No JVD.  No lymphadenopathy, cervical.  RESPIRATORY:  Clear to auscultation bilaterally.  No egophony.  No dullness.  CARDIOVASCULAR:  Regular rate and rhythm.  No murmurs or gallops.  GI:  Soft, nondistended.  Mild, diffuse tenderness.  Positive bowel sounds.  EXTREMITIES:  No edema.  No palpable cords in calves.  There are 2+ pulses  in all extremities.  SKIN:  Clear.  LYMPHS:  There is no lymphadenopathy.  MUSCULOSKELETAL:  There is 5/5 strength in all extremities.  NEURO:  Cranial nerves II-XII intact.  Sensation intact in all extremities.   LABORATORY DATA:  Labs obtained upon admission are a sodium of 142,  potassium 3.0, chloride 108, bicarb of 27, BUN 7, creatinine 0.9, glucose  141.  White blood cell count 4.2, hemoglobin 12.4, platelets 201, ANC  of  2.6, MCV 101.3.  Bili 0.6, alk phos 74, SGOT 23, SGPT 17, protein 6.7,  albumin 3.3, calcium 9.1.  UA was negative for nitrites, negative for  leukocytes.  BNP was less than 30.  D-dimer of 1.22.  Studies obtained  during hospitalization, chest x-ray showed no cardiomegaly, no edema, left  lower lobe atelectasis versus scar versus infiltrate.  CT scan of chest  showed no pulmonary embolism; a left lower lobe atelectasis versus scar  versus infiltrate noted on that.  Labs upon discharge are a sodium of 140, a  potassium of 3.5, chloride 104, carbon dioxide 26, glucose 130, BUN 14,  creatinine 1.1, calcium 8.8.  CBC of white blood cell count 7.5, hemoglobin  12.4, platelets 174.  Also obtained during hospitalization was a TSH of  0.722, free T4 of 1.20.  Serial cardiac enzymes, CK of 128, 129, 116,  respectively, CK-MB of 1.2, 1.1., and 1.1, respectively, and troponin-I of  0.01, less than 0.01, and 0.01 as well.  Folate level of greater than 20.  AFB culture and smear of sputum was negative.  PPD was also negative.  B12  was 738.   HOSPITAL COURSE:  1. Community-acquired pneumonia.  Patient initially started on Avelox for     her pneumonia.  Patient's temperature resolved to within normal within     one day of admission, and she remained afebrile throughout     hospitalization.  Initially, patient was put on isolation secondary to     thought of TB, given her foster daughter is HIV positive and concern     about tuberculosis, but all studies were negative and confirmed that     daughter did not have TB.  Patient's cough has resolved during the     hospitalization as well as with just routine Robitussin-DM with a minimal     cough upon discharge.  2. Hypothyroid, hypertension, anxiety, all were treated during     hospitalization, just continued on current therapeutic regimen.  No     changes were made to her medications. 3. Hyperlipidemia.  Patient will resume her Lipitor, as  previously taken.  4. Hypokalemia.  During hospitalization, patient's potassium was down  to 3.0     and had continued to be repleted.  Upon discharge, potassium level was     3.5 and stable.  Patient was also changed to Dyazide for triamterene     effect to keep potassium elevated along with hydrochlorothiazide.  We     will need to recheck this at next visit.  5. GERD.  Patient did have some mild heartburn but relieved with Protonix     and some mild abdominal pain, which was relieved with bowel movement and     only occurred following meals.  I feel this was related to her reflux     disease.  No acute signs of diverticulitis at this time.   DISPOSITION:   DIET:  Patient is to continue with a low-salt, low-cholesterol, low-fat  diet.   DISCHARGE INSTRUCTIONS:  She is to [facility] or come back  to the emergency department if she has any further high temperature or  worsening breathing.   FOLLOW UP:  She is to follow up with Dr. Nevada Crane in [facility] on  Oct 28, 2002, and at that time, we will get a BMP drawn for further  evaluation of her potassium.                                               Delphina Cahill, M.D.    ZH/MEDQ  D:  10/22/2002  T:  10/23/2002  Job:  [id number]

## 2010-10-26 NOTE — Consult Note (Signed)
[place], [place] NO.:  [id number]   MEDICAL RECORD NO.:  [id number]          PATIENT TYPE:  INP   LOCATION:  2009                         FACILITY:  [place]   PHYSICIAN:  Marijo Conception. Wall, M.D.   DATE OF BIRTH:  [birth year]   DATE OF CONSULTATION:  DATE OF DISCHARGE:                                   CONSULTATION   We are asked by the primary teaching service to evaluate Alexandra Booth, a  very pleasant, humorous 77 year old black female with chest pain off and on  for the past year.  She had some T-wave inversion I and aVL on admission.  Cardiac enzymes have been negative.   She had a heart catheterization in 2003 at which time she had a 20% distal  LAD stenosis and a 20% circumflex stenosis.  She had normal left ventricular  systolic function with an EF of 55%.  Her risk factors include hypertension,  age, type 2 diabetes, obesity, sedentary lifestyle and hyperlipidemia.  She  quit smoking 30 years ago.   Her chest discomfort is described as an ache in the center of her chest.  It  is pretty much constant.  It is made worse with movement and this seems to  be helped by nitroglycerin.   ALLERGIES:  PENICILLIN.   MEDICATIONS:  1.  Sublingual nitroglycerin p.r.n.  2.  Flexeril 5 mg p.o. t.i.d.  3.  Synthroid 0.025 mg daily.  4.  Amitriptyline 75 mg nightly.  5.  Aricept 10 mg nightly.  6.  Mirtazapine 50 mg daily.  7.  Sulfate 1 g daily.  8.  Quinapril 10 mg a day.  9.  Protonix 40 mg daily.  10. Isordil 300 mg a day.  11. Lopressor 25 mg daily.  12. Fluoxetine 20 mg a day.   PAST SURGICAL HISTORY:  1.  Lumbar decompression with laminectomy 2001.  2.  Tonsillectomy and thyroidectomy.   PAST SURGICAL HISTORY:  1.  History of arthritis.  2.  Diverticulitis.  3.  Depression.   SOCIAL HISTORY:  She lives by herself in [place], [place].  Son  checks on her daily.  She is retired.   FAMILY HISTORY:  Really noncontributory.   REVIEW OF  SYSTEMS:  Notable positives including history of sweats, vertigo,  headaches, reflex symptoms, PND, edema, palpitations, presyncope,  claudication ?, urinary frequency, nocturia, mood disturbances with  depression/anxiety, arthralgias, joint swelling and pain, heat and cold  intolerance.   PHYSICAL EXAMINATION:  GENERAL APPEARANCE:  She is quite funny and does not  appear to be in much pain.  VITAL SIGNS:  Blood pressure is 124/70, pulse 84 and regular.  She is in  sinus rhythm.  Temperature 96.9, respiratory rate 20.  She weighs 209  pounds.  Her O2 sat is 96% on room air.  SKIN:  Warm and dry.  HEENT:  Normocephalic and atraumatic.  PERRLA.  Extraocular movements  intact.  Sclerae are clear.  NECK:  Carotid upstrokes are equal bilaterally without bruits.  There is no  JVD.  Thyroid is not enlarged.  Trachea is midline.  CHEST:  Chest wall reveals diffuse tenderness to palpation.  LUNGS:  Clear.  No rub.  ABDOMEN:  Soft.  There are good bowel sounds.  There is no midline bruit.  There is no hepatomegaly.  EXTREMITIES:  No cardiac enzymes.  Pulses are intact and fairly brisk.   Chest x-ray shows mild pulmonary vascular congestion and no overt heart  failure.   EKG shows T-wave inversion in I and aVL.   TSH is normal.  Stool for guaiac is negative.  Hemoglobin 11.1.  Potassium  4.1, creatinine 0.9.  D-dimer was elevated at 0.83.  Cardiac enzymes  negative.   ASSESSMENT:  1.  Chest pain most consistent with musculoskeletal pain plus/minus anxiety.      I sincerely doubt this is coronary ischemia, though she is elderly and      diabetic and has some known disease on previous cath three years ago.      This needs to be requantitated to rule out any high risk anatomy.  2.  Hypertension.  3.  Type 2 diabetes.  4.  Hyperlipidemia.  5.  History of gastroesophageal reflux.  6.  History of anxiety/depression.  7.  Arthritis.   RECOMMENDATIONS:  1.  Check 2-D echocardiogram  which  has already been obtained.  2.  Adenosine Cardiolite to rule out any obstructive coronary disease or      high risk anatomy.   If the above are negative, I would treat her with b.i.d. Lopressor,  discontinue her Isordil and continue quinapril as well as an aspirin 81 mg a  day.   If it is positive, cardiac catheterization will be recommended.   Thank you very much for this consultation.      Thomas C. Wall, M.D.  Electronically Signed     TCW/MEDQ  D:  03/06/2005  T:  03/07/2005  Job:  [id number]   cc:   Evette Doffing, M.D.  Fax: [phone]   Ernestine Mcmurray, M.D. [facility]  [address] [place]  [place] [address]

## 2010-10-26 NOTE — Discharge Summary (Signed)
Alexandra Booth, Alexandra Booth NO.:  [id number]   MEDICAL RECORD NO.:  [id number]          PATIENT TYPE:  INP   LOCATION:  2009                         FACILITY:  [facility]   PHYSICIAN:  Alexandra Booth, M.D.   DATE OF BIRTH:  [birth year]   DATE OF ADMISSION:  03/05/2005  DATE OF DISCHARGE:  03/08/2005                                 DISCHARGE SUMMARY   PRIMARY CARE PHYSICIAN:  Alexandra Booth, M.D.  Cardiologist, Dr. Dannielle Booth,  [facility].  Gastroenterology, Dr. Norberto Sorenson T. Alexandra Booth.   DISCHARGE DIAGNOSES:  1.  Chest pain, ruled out myocardial infarction, negative Cardiolite for      ischemia.  2.  History of gastroesophageal reflux disease.  3.  History of hypertension.  4.  History of hyperlipidemia.  5.  History of hyperthyroidism.  6.  History of dementia.  7.  History of depression.  8.  History of carcinoid tumor of duodenum.  Endoscopy placed them at 11 mm      in 2004, now with new-onset diarrhea.  Follow up with Dr. Lucio Booth      of [facility].  A 24-hour serotonin level pending.  9.  History of coronary artery disease, status post catheterization in 2003,      showing left anterior descending 10-20% stenosis of the distal segment,      ejection fraction 55%.   DISCHARGE MEDICATIONS:  1.  Levothyroxine 25 mcg once daily.  2.  Elavil 75 mg p.o. nightly  3.  Aricept 10 mg p.o. daily.  4.  Prozac 20 mg p.o. daily.  5.  Lisinopril 10 mg p.o. daily.  6.  Protonix 40 mg p.o. daily.  7.  Sucralfate 1 g p.o. q.a.c. and before meals.  8.  Remeron 15 mg p.o. daily nightly.  9.  Imdur 30 mg p.o. daily.  10. Baby aspirin 81 mg p.o. daily.  11. Lopressor 25 mg p.o. b.i.d.  12. Nitroglycerin 0.4 mg p.r.n. chest pain every 5 minutes x3.   DISPOSITION:  The patient has been discharged to home in fair condition.   FOLLOW UP:  Follow up with Dr. Irine Booth, her primary care physician,  at [facility] on October 2.  Follow up with Dr. Lucio Booth, [facility], on October 25, at 11:15 a.m.  During followup, need to  address following issues:  1.  Check on her clinical status post admission for chest pain.  2.  Need to follow up on her 24-hour serotonin level given her past history      of carcinoid tumor in the duodenum to rule out a metastatic disease.  3.  Follow up on her diarrhea.  4.  Follow up on her chronic issues.  5.  Evidently, the patient has been diagnosed with carcinoid tumor of the      duodenum.  Endoscopy estimated at 11 mm in 2004.  Follow up with Dr.      Fuller Booth at [place].  She was given option of surgery.  A nuclear      medicine scan for possibility of metastatic disease offered, but per  patient, she preferred not to have any further medical intervention.      Further in discussion with her, she expressed similar wish this time.      Again, given her new-onset diarrhea, we are concerned about a possible      metastatic carcinoid syndrome.  For this reason, check a 24-hour urine      for serotonin level and I also made a follow-up appointment with Dr.      Fuller Booth at [place].  The patient was explained her options and she is      aware that she can make her decision of surgery or nonsurgical      treatment.  6.  The patient also had interesting evidence of sluggish flow in her venous      flow in bilateral legs which likely has increased risk for deep venous      thrombosis, but not significant enough to start her on any      anticoagulation.   CONSULTATIONS:  Cardiology, Alexandra Booth, M.D.   PROCEDURES:  1.  Cardiolite Myoview on March 07, 2005, which showed no evidence of      pharmacological induced myocardial ischemia or myocardial scar, normal      left ventricular Booth motion study, calculated left ventricular ejection      fraction of 75%.  2.  Venous Dopplers of bilateral legs on March 06, 2005, which showed      bilateral no evidence of deep venous thrombosis,  supraventricular      tachycardia or Baker's cyst, but did show sluggish flow noted      particularly noted at greater saphenous vein and popliteal Booth level.   HISTORY OF PRESENT ILLNESS:  Alexandra Booth is a 77 year old, African-  American female with past medical history of hypertension, diabetes,  hyperthyroidism, gastroesophageal reflux disease and history of coronary  artery disease, status post catheterization in 2003, with ejection fraction  more than 55, hyperlipidemia, depression, arthritis who was scheduled to  have cataract surgery on the day of admission suddenly developed chest pain  prior to surgery.  Pain was substernal and noted the intensity 6/10,  squeezing sensation with radiation to the left arm relieved by nitroglycerin  and morphine, aggravated with palpitation and shortness of breath at  baseline.  No exacerbation of chest pain exertion.  No diaphoresis or  nausea, vomiting noted.  No fevers or chills noted.  She also denied PND.   PHYSICAL EXAMINATION:  VITAL SIGNS:  Temperature 98.2, blood pressure  [id number] systolic and diastolic 0000000, respirations 22, saturations 97% on 2  L nasal cannula.  GENERAL:  During examination, she was alert, awake, oriented x3 in no acute  distress.  HEENT:  PERRLA.  Extraocular movements intact.  ENT clear.  Oropharynx and  moist mucous membranes.  NECK:  Supple, no JVD, thyromegaly or lymphadenopathy.  LUNGS:  Respirations seem clear to auscultation bilaterally with no evidence  of wheezing, rhonchi or rales.  CARDIAC:  S1, S2, regular rate and rhythm with no murmurs, rubs or gallops  appreciated.  ABDOMEN:  Soft, nontender, distended, positive bowel sounds.  No CVA  tenderness.  EXTREMITIES:  No clubbing, cyanosis or edema or edema, 2+ bilateral pedal  pulses.  SKIN:  Warm to touch.  No rash.  NEUROLOGIC:  Intact mental status.  Alert, awake and oriented x3.  Cranial  nerves 2-12 grossly intact.  Motor strength 5/5 in all  extremities, nonfocal.   LABORATORY DATA AND X-RAY FINDINGS:  Hemoglobin  12.1, MCV 102.9, WBC 6.1,  platelets 231.  BMET showed sodium of 140, potassium 3.5, chloride 109,  bicarb 26, BUN 13, creatinine 0.8, glucose 109.  PT 13.5, INR 1.0, PTT 28.  Cardiac enzymes with CK 97, CK-MB 1.4; second set 1.8.  Troponin 0.02;  second set 0.01.  TSH 32.   HOSPITAL COURSE:  Problem 1.  CARDIOVASCULAR:  A 77 year old, African-  American female with past medical history of coronary artery disease, status  post catheterization with disease in LAD with chest pain.  Differential  diagnosis at the time of examination was, given her past history, acute  coronary syndrome with stable angina versus costochondritis given her pain  on palpation with diagnosis of occlusion versus PE versus diagnosis of  occlusion anxiety.  She was admitted for observation and watched over night  on telemetry.  Cardiac enzymes were cycled which were negative.  EKG was  checked which basically showed nonspecific T wave changes in V4-V6.  No  significant ST changes.  Venous Dopplers were checked which were negative  for DVT.  ABG showed good oxygenation and no further workup for PE was done  given low probability.  Given her history of coronary artery disease and  possible stable angina, there was no elevation of cardiac enzymes.  She was  started on Lovenox and cardiology was consulted.  Dr. Jenell Milliner saw the  patient and she got chemical Cardiolite which was negative for ischemia.  No  further workup was done on this matter and she will be followed up as an  outpatient.  For this, she was also started on low-dose aspirin 81 mg daily  and her beta-blocker was increased to 25 mg b.i.d.  Her TSH was also checked  to make sure she does not have cardiomyopathy secondary to hypothyroidism  which was at normal limits.  She was maintained on nitroglycerin drip for  the chest pain which was discontinued and she remained chest pain  free for  more than 24 hours prior to discharge.  She will be discharged home to  follow up with her primary care physician, Dr. Irine Booth, in his  clinic and also with cardiology.   Problem 2.  HISTORY OF CARCINOID TUMOR OF THE DUODENUM:  Ms. Lamy has a  history of carcinoid tumor which was diagnosed by endoscopy in 2004, and  followed by Dr. Lucio Booth of [facility].  No surgical intervention was  done in the past per patient's chart.   Problem 3.  HYPERTENSION:  This remained stable under good control.  She was  on Lopressor 25 mg daily before admission.  This was increased to 25 mg  b.i.d.  During her hospital follow up, need to check on blood pressure.  She  will be also continued on home medication lisinopril 10 mg daily.   Problem 4.  GASTROESOPHAGEAL REFLUX DISEASE:  She was on Protonix and  Sucralfate prior to admission which was continued and discharged on the same medication.  Again, we need to be careful now that she is being started on  low-dose aspirin for cardiac prophylaxis.   Problem 5.  DEPRESSION:  Continue her home medication of Paxil.   Problem 6.  HISTORY OF DEMENTIA:  Continue on her Aricept.   Problem 7.  MICROCYTIC ANEMIA:  Her hemoglobin was pretty stable around her  baseline and to be at 10.7 at the time of discharge.  B12 was within normal  limits.  RBC folate was pending at the time  of discharge.  She was also  guaiac negative.  Further workup regarding this matter needs to be  considered as an outpatient.   DISCHARGE PHYSICAL EXAMINATION:  VITAL SIGNS:  Temperature 97.8, blood  pressure 139/73, pulse 67, respirations 20.   DISCHARGE LABORATORY DATA AND X-RAY FINDINGS:  CBC with hemoglobin 10.7,  white count 5.2.  BMET with sodium 142, potassium 3.6, chloride 114, BUN 10,  creatinine 0.3, glucose 133.      Judie Bonus, MD    ______________________________  Alexandra Booth, M.D.    SY/MEDQ  D:  03/14/2005  T:  03/14/2005   Job:  [id number]   cc:   Alexandra Seal, MD  Fax: [phone]   Pricilla Riffle. Alexandra Booth, M.D. [facility]  [address]  [place] [address]

## 2010-11-08 ENCOUNTER — Other Ambulatory Visit: Payer: Self-pay | Admitting: *Deleted

## 2010-11-09 MED ORDER — MIRTAZAPINE 15 MG PO TABS: 15.0000 mg | ORAL_TABLET | Freq: Every day | ORAL | Status: AC

## 2010-11-09 NOTE — Telephone Encounter (Signed)
Faxed to pt's pharmacy-[facility].

## 2011-03-14 LAB — GLUCOSE, CAPILLARY: Glucose-Capillary: 155 mg/dL — ABNORMAL HIGH (ref 70–99)

## 2011-05-06 ENCOUNTER — Other Ambulatory Visit: Payer: Self-pay | Admitting: Gastroenterology

## 2011-05-09 ENCOUNTER — Ambulatory Visit
Admission: RE | Admit: 2011-05-09 | Discharge: 2011-05-09 | Disposition: A | Discharge status: Home / Self Care | Payer: Medicare Other | Source: Ambulatory Visit | Attending: Gastroenterology | Admitting: Gastroenterology

## 2011-05-09 MED ORDER — IOHEXOL 300 MG/ML  SOLN
100.0000 mL | Freq: Once | INTRAMUSCULAR | Status: AC | PRN
  Administered 2011-05-09: 100 mL via INTRAVENOUS

## 2011-08-22 ENCOUNTER — Other Ambulatory Visit: Payer: Self-pay | Admitting: Family Medicine

## 2011-08-22 ENCOUNTER — Ambulatory Visit
Admission: RE | Admit: 2011-08-22 | Discharge: 2011-08-22 | Disposition: A | Discharge status: Home / Self Care | Payer: No Typology Code available for payment source | Source: Ambulatory Visit | Attending: Family Medicine | Admitting: Family Medicine

## 2011-08-22 DIAGNOSIS — M549 Dorsalgia, unspecified: Secondary | ICD-10-CM

## 2012-08-20 ENCOUNTER — Ambulatory Visit
Admission: RE | Admit: 2012-08-20 | Discharge: 2012-08-20 | Disposition: A | Discharge status: Home / Self Care | Payer: No Typology Code available for payment source | Source: Ambulatory Visit | Attending: Family Medicine | Admitting: Family Medicine

## 2012-08-20 ENCOUNTER — Other Ambulatory Visit: Payer: Self-pay | Admitting: Family Medicine

## 2012-08-20 DIAGNOSIS — R079 Chest pain, unspecified: Secondary | ICD-10-CM

## 2013-01-21 ENCOUNTER — Ambulatory Visit (HOSPITAL_COMMUNITY): Payer: Medicare (Managed Care) | Attending: Family Medicine | Admitting: Radiology

## 2013-01-21 ENCOUNTER — Encounter: Payer: Self-pay | Admitting: Cardiology

## 2013-01-21 VITALS — BP 90/38 | HR 58 | Ht 69.5 in | Wt 194.0 lb

## 2013-01-21 DIAGNOSIS — R002 Palpitations: Secondary | ICD-10-CM | POA: Insufficient documentation

## 2013-01-21 DIAGNOSIS — R55 Syncope and collapse: Secondary | ICD-10-CM | POA: Insufficient documentation

## 2013-01-21 DIAGNOSIS — R079 Chest pain, unspecified: Secondary | ICD-10-CM

## 2013-01-21 DIAGNOSIS — R0602 Shortness of breath: Secondary | ICD-10-CM | POA: Insufficient documentation

## 2013-01-21 DIAGNOSIS — E119 Type 2 diabetes mellitus without complications: Secondary | ICD-10-CM | POA: Insufficient documentation

## 2013-01-21 DIAGNOSIS — I10 Essential (primary) hypertension: Secondary | ICD-10-CM | POA: Insufficient documentation

## 2013-01-21 DIAGNOSIS — R000 Tachycardia, unspecified: Secondary | ICD-10-CM | POA: Insufficient documentation

## 2013-01-21 DIAGNOSIS — I2510 Atherosclerotic heart disease of native coronary artery without angina pectoris: Secondary | ICD-10-CM | POA: Insufficient documentation

## 2013-01-21 DIAGNOSIS — R0609 Other forms of dyspnea: Secondary | ICD-10-CM | POA: Insufficient documentation

## 2013-01-21 DIAGNOSIS — R42 Dizziness and giddiness: Secondary | ICD-10-CM | POA: Insufficient documentation

## 2013-01-21 DIAGNOSIS — R0989 Other specified symptoms and signs involving the circulatory and respiratory systems: Secondary | ICD-10-CM | POA: Insufficient documentation

## 2013-01-21 MED ORDER — TECHNETIUM TC 99M SESTAMIBI GENERIC - CARDIOLITE
11.0000 | Freq: Once | INTRAVENOUS | Status: AC | PRN
  Administered 2013-01-21: 11 via INTRAVENOUS

## 2013-01-21 MED ORDER — REGADENOSON 0.4 MG/5ML IV SOLN
0.4000 mg | Freq: Once | INTRAVENOUS | Status: AC
  Administered 2013-01-21: 0.4 mg via INTRAVENOUS

## 2013-01-21 MED ORDER — TECHNETIUM TC 99M SESTAMIBI GENERIC - CARDIOLITE
33.0000 | Freq: Once | INTRAVENOUS | Status: AC | PRN
  Administered 2013-01-21: 33 via INTRAVENOUS

## 2013-01-21 NOTE — Progress Notes (Signed)
[facility] NUCLEAR [facility] [address] [place], [place] [address] [phone]    Cardiology Nuclear Med Study  Alexandra Booth is a 79 y.o. female     MRN : [id number]     DOB: April 10, 1924  Procedure Date: 01/21/2013  Nuclear Med Background Indication for Stress Test:  Evaluation for Ischemia History:  '03 Cath:minimal n/o CAD, EF=55%; '06 Echo:EF=65%; '06 [id number], EF=75% Cardiac Risk Factors: Hypertension, Lipids and NIDDM  Symptoms:  Chest Pain (last episode of chest discomfort was last night), Dizziness, SOB/DOE, Near Syncope, Palpitations and Rapid HR    Nuclear Pre-Procedure Caffeine/Decaff Intake:  None NPO After: 12:00am   Lungs:  Clear. O2 Sat: 90% on room air. IV 0.9% NS with Angio Cath:  22g  IV Site: R Hand  IV Started by:  Crissie Figures, RN  Chest Size (in):  38 Cup Size: C  Height: 5' 9.5" (1.765 m)  Weight:  194 lb (87.998 kg)  BMI:  Body mass index is 28.25 kg/(m^2). Tech Comments:  She was given 350 cc normal saline d/t hypotension with relief.    Nuclear Med Study 1 or 2 day study: 1 day  Stress Test Type:  Carlton Adam  Reading MD: Mertie Moores, MD  Order Authorizing Provider:  Barney Drain, MD  Resting Radionuclide: Technetium 5m Sestamibi  Resting Radionuclide Dose: 11.0 mCi   Stress Radionuclide:  Technetium 43m Sestamibi  Stress Radionuclide Dose: 33.0 mCi           Stress Protocol Rest HR: 58 Stress HR: 69  Rest BP: 90/38 Stress BP: 94/44  Exercise Time (min): n/a METS: n/a   Predicted Max HR: 134 bpm % Max HR: 51.49 bpm Rate Pressure Product: 6486   Dose of Adenosine (mg):  n/a Dose of Lexiscan: 0.4 mg  Dose of Atropine (mg): n/a Dose of Dobutamine: n/a mcg/kg/min (at max HR)  Stress Test Technologist: Letta Moynahan, CMA-N  Nuclear Technologist:  Annye Rusk, CNMT     Rest Procedure:  Myocardial perfusion imaging was performed at rest 45 minutes following the intravenous administration of Technetium 80m  Sestamibi.  Rest ECG: NSR, NS St abnormalities  Stress Procedure:  The patient received IV Lexiscan 0.4 mg over 15-seconds.  Technetium 73m Sestamibi injected at 30-seconds.  She c/o chest fullness with Lexiscan.  Quantitative spect images were obtained after a 45 minute delay.  Stress ECG: No significant change from baseline ECG  QPS Raw Data Images:  Normal; no motion artifact; normal heart/lung ratio. Stress Images:  Normal homogeneous uptake in all areas of the myocardium. Rest Images:  Normal homogeneous uptake in all areas of the myocardium. Subtraction (SDS):  No evidence of ischemia. Transient Ischemic Dilatation (Normal <1.22):  n/a Lung/Heart Ratio (Normal <0.45):  0.33  Quantitative Gated Spect Images QGS EDV:  61 ml QGS ESV:  18 ml  Impression Exercise Capacity:  Lexiscan with no exercise. BP Response:  Normal blood pressure response. Clinical Symptoms:  No significant symptoms noted. ECG Impression:  No significant ST segment change suggestive of ischemia. Comparison with Prior Nuclear Study: No images to compare  Overall Impression:  Normal stress nuclear study.   There is no evidence of ischemia.    LV Ejection Fraction: 71%.  LV Wall Motion:  NL LV Function; NL Wall Motion.   Thayer Headings, Brooke Bonito., MD, [facility] 01/21/2013, 5:31 PM Office - [phone] Pager [phone]

## 2013-01-22 NOTE — Progress Notes (Signed)
Nuclear Report faxed to Dr. Bradd Burner at [phone]. Evalina Field, RT-N

## 2013-01-28 ENCOUNTER — Ambulatory Visit
Admission: RE | Admit: 2013-01-28 | Discharge: 2013-01-28 | Disposition: A | Discharge status: Home / Self Care | Payer: Medicare (Managed Care) | Source: Ambulatory Visit | Attending: Family Medicine | Admitting: Family Medicine

## 2013-01-28 ENCOUNTER — Other Ambulatory Visit: Payer: Self-pay | Admitting: Family Medicine

## 2014-09-11 ENCOUNTER — Inpatient Hospital Stay (HOSPITAL_COMMUNITY)
Admission: EM | Admit: 2014-09-11 | Discharge: 2014-09-19 | DRG: 481 | Disposition: A | Discharge status: Skilled Nursing Facility | Payer: Medicare (Managed Care) | Attending: Internal Medicine | Admitting: Internal Medicine

## 2014-09-11 ENCOUNTER — Emergency Department (HOSPITAL_COMMUNITY): Payer: Medicare (Managed Care)

## 2014-09-11 ENCOUNTER — Encounter (HOSPITAL_COMMUNITY): Payer: Self-pay | Admitting: Emergency Medicine

## 2014-09-11 DIAGNOSIS — S7292XA Unspecified fracture of left femur, initial encounter for closed fracture: Secondary | ICD-10-CM

## 2014-09-11 DIAGNOSIS — N184 Chronic kidney disease, stage 4 (severe): Secondary | ICD-10-CM | POA: Diagnosis present

## 2014-09-11 DIAGNOSIS — W19XXXA Unspecified fall, initial encounter: Secondary | ICD-10-CM | POA: Diagnosis not present

## 2014-09-11 DIAGNOSIS — D890 Polyclonal hypergammaglobulinemia: Secondary | ICD-10-CM | POA: Diagnosis present

## 2014-09-11 DIAGNOSIS — Z87891 Personal history of nicotine dependence: Secondary | ICD-10-CM

## 2014-09-11 DIAGNOSIS — Z114 Encounter for screening for human immunodeficiency virus [HIV]: Secondary | ICD-10-CM

## 2014-09-11 DIAGNOSIS — D539 Nutritional anemia, unspecified: Secondary | ICD-10-CM | POA: Diagnosis present

## 2014-09-11 DIAGNOSIS — E119 Type 2 diabetes mellitus without complications: Secondary | ICD-10-CM | POA: Diagnosis present

## 2014-09-11 DIAGNOSIS — F0280 Dementia in other diseases classified elsewhere without behavioral disturbance: Secondary | ICD-10-CM | POA: Diagnosis present

## 2014-09-11 DIAGNOSIS — N3281 Overactive bladder: Secondary | ICD-10-CM | POA: Diagnosis present

## 2014-09-11 DIAGNOSIS — D62 Acute posthemorrhagic anemia: Secondary | ICD-10-CM | POA: Insufficient documentation

## 2014-09-11 DIAGNOSIS — R079 Chest pain, unspecified: Secondary | ICD-10-CM

## 2014-09-11 DIAGNOSIS — I2510 Atherosclerotic heart disease of native coronary artery without angina pectoris: Secondary | ICD-10-CM | POA: Diagnosis present

## 2014-09-11 DIAGNOSIS — W1830XA Fall on same level, unspecified, initial encounter: Secondary | ICD-10-CM | POA: Diagnosis present

## 2014-09-11 DIAGNOSIS — S72009A Fracture of unspecified part of neck of unspecified femur, initial encounter for closed fracture: Secondary | ICD-10-CM | POA: Diagnosis present

## 2014-09-11 DIAGNOSIS — R32 Unspecified urinary incontinence: Secondary | ICD-10-CM | POA: Diagnosis present

## 2014-09-11 DIAGNOSIS — R509 Fever, unspecified: Secondary | ICD-10-CM

## 2014-09-11 DIAGNOSIS — I499 Cardiac arrhythmia, unspecified: Secondary | ICD-10-CM | POA: Diagnosis not present

## 2014-09-11 DIAGNOSIS — I4581 Long QT syndrome: Secondary | ICD-10-CM | POA: Diagnosis present

## 2014-09-11 DIAGNOSIS — Z888 Allergy status to other drugs, medicaments and biological substances status: Secondary | ICD-10-CM

## 2014-09-11 DIAGNOSIS — Z79891 Long term (current) use of opiate analgesic: Secondary | ICD-10-CM

## 2014-09-11 DIAGNOSIS — E860 Dehydration: Secondary | ICD-10-CM | POA: Diagnosis present

## 2014-09-11 DIAGNOSIS — N390 Urinary tract infection, site not specified: Secondary | ICD-10-CM | POA: Diagnosis present

## 2014-09-11 DIAGNOSIS — R109 Unspecified abdominal pain: Secondary | ICD-10-CM

## 2014-09-11 DIAGNOSIS — F418 Other specified anxiety disorders: Secondary | ICD-10-CM | POA: Diagnosis present

## 2014-09-11 DIAGNOSIS — F329 Major depressive disorder, single episode, unspecified: Secondary | ICD-10-CM

## 2014-09-11 DIAGNOSIS — E890 Postprocedural hypothyroidism: Secondary | ICD-10-CM | POA: Diagnosis present

## 2014-09-11 DIAGNOSIS — Z882 Allergy status to sulfonamides status: Secondary | ICD-10-CM

## 2014-09-11 DIAGNOSIS — Z6835 Body mass index (BMI) 35.0-35.9, adult: Secondary | ICD-10-CM | POA: Diagnosis not present

## 2014-09-11 DIAGNOSIS — K219 Gastro-esophageal reflux disease without esophagitis: Secondary | ICD-10-CM | POA: Diagnosis present

## 2014-09-11 DIAGNOSIS — Z9181 History of falling: Secondary | ICD-10-CM | POA: Diagnosis not present

## 2014-09-11 DIAGNOSIS — R918 Other nonspecific abnormal finding of lung field: Secondary | ICD-10-CM | POA: Diagnosis present

## 2014-09-11 DIAGNOSIS — N179 Acute kidney failure, unspecified: Secondary | ICD-10-CM | POA: Diagnosis present

## 2014-09-11 DIAGNOSIS — E669 Obesity, unspecified: Secondary | ICD-10-CM | POA: Diagnosis present

## 2014-09-11 DIAGNOSIS — R9431 Abnormal electrocardiogram [ECG] [EKG]: Secondary | ICD-10-CM | POA: Diagnosis present

## 2014-09-11 DIAGNOSIS — N289 Disorder of kidney and ureter, unspecified: Secondary | ICD-10-CM | POA: Diagnosis not present

## 2014-09-11 DIAGNOSIS — M80052A Age-related osteoporosis with current pathological fracture, left femur, initial encounter for fracture: Secondary | ICD-10-CM | POA: Diagnosis present

## 2014-09-11 DIAGNOSIS — Z8781 Personal history of (healed) traumatic fracture: Secondary | ICD-10-CM

## 2014-09-11 DIAGNOSIS — R630 Anorexia: Secondary | ICD-10-CM | POA: Diagnosis present

## 2014-09-11 DIAGNOSIS — K5900 Constipation, unspecified: Secondary | ICD-10-CM | POA: Diagnosis not present

## 2014-09-11 DIAGNOSIS — Y92008 Other place in unspecified non-institutional (private) residence as the place of occurrence of the external cause: Secondary | ICD-10-CM

## 2014-09-11 DIAGNOSIS — Z9889 Other specified postprocedural states: Secondary | ICD-10-CM

## 2014-09-11 DIAGNOSIS — M545 Low back pain: Secondary | ICD-10-CM | POA: Diagnosis present

## 2014-09-11 DIAGNOSIS — Z7982 Long term (current) use of aspirin: Secondary | ICD-10-CM

## 2014-09-11 DIAGNOSIS — Z880 Allergy status to penicillin: Secondary | ICD-10-CM

## 2014-09-11 DIAGNOSIS — E785 Hyperlipidemia, unspecified: Secondary | ICD-10-CM | POA: Diagnosis present

## 2014-09-11 DIAGNOSIS — N183 Chronic kidney disease, stage 3 (moderate): Secondary | ICD-10-CM | POA: Diagnosis present

## 2014-09-11 DIAGNOSIS — G309 Alzheimer's disease, unspecified: Secondary | ICD-10-CM | POA: Diagnosis present

## 2014-09-11 DIAGNOSIS — R9389 Abnormal findings on diagnostic imaging of other specified body structures: Secondary | ICD-10-CM

## 2014-09-11 DIAGNOSIS — S72002A Fracture of unspecified part of neck of left femur, initial encounter for closed fracture: Secondary | ICD-10-CM

## 2014-09-11 DIAGNOSIS — M810 Age-related osteoporosis without current pathological fracture: Secondary | ICD-10-CM | POA: Diagnosis present

## 2014-09-11 DIAGNOSIS — D649 Anemia, unspecified: Secondary | ICD-10-CM | POA: Diagnosis present

## 2014-09-11 DIAGNOSIS — E039 Hypothyroidism, unspecified: Secondary | ICD-10-CM | POA: Diagnosis present

## 2014-09-11 DIAGNOSIS — F419 Anxiety disorder, unspecified: Secondary | ICD-10-CM

## 2014-09-11 DIAGNOSIS — Z79899 Other long term (current) drug therapy: Secondary | ICD-10-CM

## 2014-09-11 DIAGNOSIS — I10 Essential (primary) hypertension: Secondary | ICD-10-CM

## 2014-09-11 DIAGNOSIS — S22000A Wedge compression fracture of unspecified thoracic vertebra, initial encounter for closed fracture: Secondary | ICD-10-CM | POA: Diagnosis present

## 2014-09-11 DIAGNOSIS — I129 Hypertensive chronic kidney disease with stage 1 through stage 4 chronic kidney disease, or unspecified chronic kidney disease: Secondary | ICD-10-CM | POA: Diagnosis present

## 2014-09-11 DIAGNOSIS — M79652 Pain in left thigh: Secondary | ICD-10-CM | POA: Diagnosis present

## 2014-09-11 DIAGNOSIS — R062 Wheezing: Secondary | ICD-10-CM | POA: Diagnosis present

## 2014-09-11 LAB — HEPATIC FUNCTION PANEL
ALT: 22 U/L (ref 0–35)
AST: 26 U/L (ref 0–37)
Albumin: 2.2 g/dL — ABNORMAL LOW (ref 3.5–5.2)
Alkaline Phosphatase: 51 U/L (ref 39–117)
Bilirubin, Direct: 0.2 mg/dL (ref 0.0–0.5)
Indirect Bilirubin: 0.3 mg/dL (ref 0.3–0.9)
TOTAL PROTEIN: 5.2 g/dL — AB (ref 6.0–8.3)
Total Bilirubin: 0.5 mg/dL (ref 0.3–1.2)

## 2014-09-11 LAB — URINALYSIS, ROUTINE W REFLEX MICROSCOPIC
BILIRUBIN URINE: NEGATIVE
GLUCOSE, UA: NEGATIVE mg/dL
KETONES UR: NEGATIVE mg/dL
Nitrite: POSITIVE — AB
PH: 5 (ref 5.0–8.0)
PROTEIN: 100 mg/dL — AB
SPECIFIC GRAVITY, URINE: 1.02 (ref 1.005–1.030)
UROBILINOGEN UA: 1 mg/dL (ref 0.0–1.0)

## 2014-09-11 LAB — RETICULOCYTES
RBC.: 2.44 MIL/uL — ABNORMAL LOW (ref 3.87–5.11)
Retic Count, Absolute: 56.1 10*3/uL (ref 19.0–186.0)
Retic Ct Pct: 2.3 % (ref 0.4–3.1)

## 2014-09-11 LAB — GLUCOSE, CAPILLARY
Glucose-Capillary: 141 mg/dL — ABNORMAL HIGH (ref 70–99)
Glucose-Capillary: 185 mg/dL — ABNORMAL HIGH (ref 70–99)

## 2014-09-11 LAB — CBC WITH DIFFERENTIAL/PLATELET
BASOS ABS: 0 10*3/uL (ref 0.0–0.1)
BASOS PCT: 0 % (ref 0–1)
Eosinophils Absolute: 0 10*3/uL (ref 0.0–0.7)
Eosinophils Relative: 0 % (ref 0–5)
HCT: 34.2 % — ABNORMAL LOW (ref 36.0–46.0)
Hemoglobin: 11.2 g/dL — ABNORMAL LOW (ref 12.0–15.0)
LYMPHS PCT: 9 % — AB (ref 12–46)
Lymphs Abs: 0.8 10*3/uL (ref 0.7–4.0)
MCH: 34 pg (ref 26.0–34.0)
MCHC: 32.7 g/dL (ref 30.0–36.0)
MCV: 104 fL — ABNORMAL HIGH (ref 78.0–100.0)
Monocytes Absolute: 0.4 10*3/uL (ref 0.1–1.0)
Monocytes Relative: 4 % (ref 3–12)
Neutro Abs: 8.1 10*3/uL — ABNORMAL HIGH (ref 1.7–7.7)
Neutrophils Relative %: 87 % — ABNORMAL HIGH (ref 43–77)
PLATELETS: 187 10*3/uL (ref 150–400)
RBC: 3.29 MIL/uL — ABNORMAL LOW (ref 3.87–5.11)
RDW: 11.7 % (ref 11.5–15.5)
WBC: 9.3 10*3/uL (ref 4.0–10.5)

## 2014-09-11 LAB — IRON AND TIBC
Iron: 33 ug/dL — ABNORMAL LOW (ref 42–145)
Saturation Ratios: 12 % — ABNORMAL LOW (ref 20–55)
TIBC: 285 ug/dL (ref 250–470)
UIBC: 252 ug/dL (ref 125–400)

## 2014-09-11 LAB — BASIC METABOLIC PANEL
Anion gap: 12 (ref 5–15)
BUN: 29 mg/dL — ABNORMAL HIGH (ref 6–23)
CO2: 19 mmol/L (ref 19–32)
Calcium: 8.8 mg/dL (ref 8.4–10.5)
Chloride: 109 mmol/L (ref 96–112)
Creatinine, Ser: 1.93 mg/dL — ABNORMAL HIGH (ref 0.50–1.10)
GFR calc Af Amer: 26 mL/min — ABNORMAL LOW (ref >90)
GFR calc non Af Amer: 22 mL/min — ABNORMAL LOW (ref >90)
Glucose, Bld: 175 mg/dL — ABNORMAL HIGH (ref 70–99)
Potassium: 4.4 mmol/L (ref 3.5–5.1)
SODIUM: 140 mmol/L (ref 135–145)

## 2014-09-11 LAB — MAGNESIUM: MAGNESIUM: 1.5 mg/dL (ref 1.5–2.5)

## 2014-09-11 LAB — URINE MICROSCOPIC-ADD ON

## 2014-09-11 LAB — PROTIME-INR
INR: 1.16 (ref 0.00–1.49)
Prothrombin Time: 14.9 seconds (ref 11.6–15.2)

## 2014-09-11 LAB — ABO/RH: ABO/RH(D): O POS

## 2014-09-11 LAB — CK: Total CK: 214 U/L — ABNORMAL HIGH (ref 7–177)

## 2014-09-11 LAB — TECHNOLOGIST SMEAR REVIEW

## 2014-09-11 LAB — TROPONIN I

## 2014-09-11 LAB — APTT: aPTT: 29 seconds (ref 24–37)

## 2014-09-11 LAB — TSH: TSH: 0.406 u[IU]/mL (ref 0.350–4.500)

## 2014-09-11 LAB — PHOSPHORUS: PHOSPHORUS: 1.7 mg/dL — AB (ref 2.3–4.6)

## 2014-09-11 MED ORDER — CEFTRIAXONE SODIUM IN DEXTROSE 20 MG/ML IV SOLN
1.0000 g | INTRAVENOUS | Status: DC
  Administered 2014-09-11 – 2014-09-13 (×2): 1 g via INTRAVENOUS
  Filled 2014-09-11 (×5): qty 50

## 2014-09-11 MED ORDER — PNEUMOCOCCAL VAC POLYVALENT 25 MCG/0.5ML IJ INJ
0.5000 mL | INJECTION | INTRAMUSCULAR | Status: AC
  Administered 2014-09-12: 0.5 mL via INTRAMUSCULAR

## 2014-09-11 MED ORDER — ASPIRIN 81 MG PO CHEW
81.0000 mg | CHEWABLE_TABLET | Freq: Every day | ORAL | Status: DC
  Administered 2014-09-11: 81 mg via ORAL
  Filled 2014-09-11: qty 1

## 2014-09-11 MED ORDER — PANTOPRAZOLE SODIUM 40 MG PO TBEC
40.0000 mg | DELAYED_RELEASE_TABLET | Freq: Every day | ORAL | Status: DC
  Administered 2014-09-11 – 2014-09-19 (×8): 40 mg via ORAL
  Filled 2014-09-11 (×8): qty 1

## 2014-09-11 MED ORDER — SODIUM CHLORIDE 0.9 % IV SOLN
1000.0000 mL | INTRAVENOUS | Status: DC
  Administered 2014-09-11 (×2): 1000 mL via INTRAVENOUS

## 2014-09-11 MED ORDER — PANTOPRAZOLE SODIUM 40 MG PO TBEC: 40.0000 mg | DELAYED_RELEASE_TABLET | Freq: Every day | ORAL | Status: DC

## 2014-09-11 MED ORDER — MORPHINE SULFATE 2 MG/ML IJ SOLN
2.0000 mg | INTRAMUSCULAR | Status: DC | PRN
  Administered 2014-09-11 – 2014-09-14 (×9): 2 mg via INTRAVENOUS
  Filled 2014-09-11 (×9): qty 1

## 2014-09-11 MED ORDER — HEPARIN SODIUM (PORCINE) 5000 UNIT/ML IJ SOLN
5000.0000 [IU] | Freq: Three times a day (TID) | INTRAMUSCULAR | Status: DC
  Administered 2014-09-11 – 2014-09-12 (×3): 5000 [IU] via SUBCUTANEOUS
  Filled 2014-09-11 (×3): qty 1

## 2014-09-11 MED ORDER — LEVOTHYROXINE SODIUM 25 MCG PO TABS
25.0000 ug | ORAL_TABLET | Freq: Every day | ORAL | Status: DC
  Administered 2014-09-12 – 2014-09-19 (×8): 25 ug via ORAL
  Filled 2014-09-11 (×8): qty 1

## 2014-09-11 MED ORDER — VITAMIN D 1000 UNITS PO TABS
1000.0000 [IU] | ORAL_TABLET | Freq: Every day | ORAL | Status: DC
  Administered 2014-09-11 – 2014-09-19 (×9): 1000 [IU] via ORAL
  Filled 2014-09-11 (×9): qty 1

## 2014-09-11 MED ORDER — ALBUTEROL SULFATE (2.5 MG/3ML) 0.083% IN NEBU
5.0000 mg | INHALATION_SOLUTION | Freq: Four times a day (QID) | RESPIRATORY_TRACT | Status: DC | PRN
Start: 2014-09-11 — End: 2014-09-19

## 2014-09-11 MED ORDER — ESCITALOPRAM OXALATE 10 MG PO TABS
20.0000 mg | ORAL_TABLET | Freq: Every day | ORAL | Status: DC
  Administered 2014-09-11 – 2014-09-19 (×8): 20 mg via ORAL
  Filled 2014-09-11 (×9): qty 2

## 2014-09-11 MED ORDER — LEVOTHYROXINE SODIUM 25 MCG PO TABS: 25.0000 ug | ORAL_TABLET | Freq: Every day | ORAL | Status: DC

## 2014-09-11 MED ORDER — ADULT MULTIVITAMIN W/MINERALS CH
1.0000 | ORAL_TABLET | Freq: Every day | ORAL | Status: DC
  Administered 2014-09-11 – 2014-09-19 (×7): 1 via ORAL
  Filled 2014-09-11 (×8): qty 1

## 2014-09-11 MED ORDER — DOCUSATE SODIUM 100 MG PO CAPS: 100.0000 mg | ORAL_CAPSULE | Freq: Two times a day (BID) | ORAL | Status: DC | PRN

## 2014-09-11 MED ORDER — MAGNESIUM SULFATE 2 GM/50ML IV SOLN
2.0000 g | Freq: Once | INTRAVENOUS | Status: AC
Start: 2014-09-11 — End: 2014-09-12
  Administered 2014-09-11: 2 g via INTRAVENOUS
  Filled 2014-09-11: qty 50

## 2014-09-11 MED ORDER — ALPRAZOLAM 0.25 MG PO TABS
0.2500 mg | ORAL_TABLET | Freq: Three times a day (TID) | ORAL | Status: DC | PRN
  Administered 2014-09-12 – 2014-09-17 (×4): 0.25 mg via ORAL
  Filled 2014-09-11 (×4): qty 1

## 2014-09-11 MED ORDER — METOPROLOL TARTRATE 1 MG/ML IV SOLN
2.5000 mg | INTRAVENOUS | Status: DC | PRN
  Filled 2014-09-11: qty 5

## 2014-09-11 MED ORDER — SODIUM CHLORIDE 0.9 % IV SOLN: INTRAVENOUS | Status: DC

## 2014-09-11 MED ORDER — CHOLECALCIFEROL 25 MCG (1000 UT) PO CAPS: 1000.0000 [IU] | ORAL_CAPSULE | Freq: Every day | ORAL | Status: DC

## 2014-09-11 MED ORDER — MIRTAZAPINE 15 MG PO TABS
15.0000 mg | ORAL_TABLET | Freq: Every day | ORAL | Status: DC
  Administered 2014-09-11 – 2014-09-19 (×8): 15 mg via ORAL
  Filled 2014-09-11 (×8): qty 1

## 2014-09-11 MED ORDER — DARIFENACIN HYDROBROMIDE ER 7.5 MG PO TB24
7.5000 mg | ORAL_TABLET | Freq: Every day | ORAL | Status: DC
  Administered 2014-09-11 – 2014-09-19 (×9): 7.5 mg via ORAL
  Filled 2014-09-11 (×9): qty 1

## 2014-09-11 MED ORDER — CYANOCOBALAMIN 500 MCG PO TABS
500.0000 ug | ORAL_TABLET | Freq: Every day | ORAL | Status: DC
  Administered 2014-09-11 – 2014-09-12 (×2): 500 ug via ORAL
  Filled 2014-09-11 (×2): qty 1

## 2014-09-11 MED ORDER — ZOLPIDEM TARTRATE 5 MG PO TABS
5.0000 mg | ORAL_TABLET | Freq: Every evening | ORAL | Status: DC | PRN
Start: 2014-09-11 — End: 2014-09-14

## 2014-09-11 MED ORDER — ADULT MULTIVITAMIN W/MINERALS CH: 1.0000 | ORAL_TABLET | Freq: Every day | ORAL | Status: DC

## 2014-09-11 MED ORDER — CALCIUM CARBONATE 1250 (500 CA) MG PO TABS
1.0000 | ORAL_TABLET | Freq: Every day | ORAL | Status: DC
  Administered 2014-09-13 – 2014-09-19 (×7): 500 mg via ORAL
  Filled 2014-09-11 (×7): qty 1

## 2014-09-11 MED ORDER — HYDROCODONE-ACETAMINOPHEN 5-325 MG PO TABS
1.0000 | ORAL_TABLET | ORAL | Status: DC | PRN
  Administered 2014-09-12: 2 via ORAL
  Filled 2014-09-11: qty 2

## 2014-09-11 MED ORDER — LEVOTHYROXINE SODIUM 100 MCG IV SOLR
12.5000 ug | Freq: Every day | INTRAVENOUS | Status: DC
  Filled 2014-09-11: qty 5

## 2014-09-11 MED ORDER — DOCUSATE SODIUM 100 MG PO CAPS: 100.0000 mg | ORAL_CAPSULE | Freq: Two times a day (BID) | ORAL | Status: DC

## 2014-09-11 NOTE — ED Notes (Signed)
Patient transported to X-ray 

## 2014-09-11 NOTE — ED Notes (Signed)
Pt returned from xray and CT.
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
## 2014-09-11 NOTE — ED Notes (Signed)
Pt monitored by pulse ox, bp cuff, and 5-lead.  MD Pickering at bedside.

## 2014-09-11 NOTE — ED Provider Notes (Signed)
CSN: [id number]     Arrival date & time 09/11/14  1102 History   First MD Initiated Contact with Patient 09/11/14 1103     Chief Complaint  Patient presents with  . Fall    Level V caveat due to altered mental status. (Consider location/radiation/quality/duration/timing/severity/associated sxs/prior Treatment) Patient is a 81 y.o. female presenting with fall. The history is provided by the patient.  Fall   patient reportedly fell last night. Patient cannot tell me the events. Reportedly found by home health nurses morning. Patient states she doesn't know what happened. Complaining of pain in her left thigh.  History reviewed. No pertinent past medical history. Past Surgical History  Procedure Laterality Date  . Laminectomy    . Femur im nail Left 09/12/2014    Procedure: INTRAMEDULLARY (IM) NAIL FEMORAL;  Surgeon: Leandrew Koyanagi, MD;  Location: [facility];  Service: Orthopedics;  Laterality: Left;   History reviewed. No pertinent family history. History  Substance Use Topics  . Smoking status: Former Research scientist (life sciences)  . Smokeless tobacco: Not on file  . Alcohol Use: No   OB History    No data available     Review of Systems  Unable to perform ROS     Allergies  Diazepam; Penicillins; and Sulfonamide derivatives  Home Medications   Prior to Admission medications   Medication Sig Start Date End Date Taking? Authorizing Provider  ALPRAZolam (XANAX) 0.25 MG tablet Take 0.25 mg by mouth 3 (three) times daily.   Yes Historical Provider, MD  aspirin 81 MG chewable tablet Chew 81 mg by mouth daily.   Yes Historical Provider, MD  Cholecalciferol 1000 UNITS capsule Take 1,000 Units by mouth daily.   Yes Historical Provider, MD  docusate sodium (COLACE) 100 MG capsule Take 100 mg by mouth 2 (two) times daily.   Yes Historical Provider, MD  escitalopram (LEXAPRO) 20 MG tablet Take 20 mg by mouth daily.   Yes Historical Provider, MD  HYDROcodone-acetaminophen (NORCO/VICODIN) 5-325 MG per tablet  Take 1 tablet by mouth 3 (three) times daily.   Yes Historical Provider, MD  levothyroxine (SYNTHROID, LEVOTHROID) 25 MCG tablet Take 25 mcg by mouth daily before breakfast.   Yes Historical Provider, MD  losartan (COZAAR) 50 MG tablet Take 75 mg by mouth every morning.   Yes Historical Provider, MD  mirtazapine (REMERON) 15 MG tablet Take 15 mg by mouth daily.   Yes Historical Provider, MD  Multiple Vitamins-Minerals (MULTIVITAMIN WITH MINERALS) tablet Take 1 tablet by mouth daily.   Yes Historical Provider, MD  omeprazole (PRILOSEC) 40 MG capsule Take 40 mg by mouth 2 (two) times daily.   Yes Historical Provider, MD  phenazopyridine (PYRIDIUM) 200 MG tablet Take 200 mg by mouth daily as needed for pain.   Yes Historical Provider, MD  solifenacin (VESICARE) 5 MG tablet Take 5 mg by mouth daily.   Yes Historical Provider, MD  vitamin B-12 (CYANOCOBALAMIN) 500 MCG tablet Take 500 mcg by mouth daily.   Yes Historical Provider, MD  zolpidem (AMBIEN) 5 MG tablet Take 5 mg by mouth at bedtime.   Yes Historical Provider, MD  enoxaparin (LOVENOX) 40 MG/0.4ML injection Inject 0.4 mLs (40 mg total) into the skin daily. 09/12/14   Naiping Ephriam Jenkins, MD  HYDROcodone-acetaminophen (NORCO) 7.5-325 MG per tablet Take 1-2 tablets by mouth every 6 (six) hours as needed for moderate pain. 09/12/14   Naiping Ephriam Jenkins, MD   BP 143/32 mmHg  Pulse 78  Temp(Src) 99.6 F (37.6 C) (Oral)  Resp 18  Ht 5\' 9"  (1.753 m)  Wt 210 lb (95.255 kg)  BMI 31.00 kg/m2  SpO2 99% Physical Exam  Constitutional: She appears well-developed.  HENT:  Head: Atraumatic.  Eyes: Pupils are equal, round, and reactive to light.  Neck: Neck supple.  Cardiovascular: Normal rate and regular rhythm.   Pulmonary/Chest: Effort normal.  Abdominal: Soft.  Musculoskeletal: She exhibits tenderness.  Tenderness to bilateral knees. Tenderness to left hip. Pain with movement of left hip. Also lumbar tenderness. Neurovascular intact over bilateral feet. No  tenderness on upper extremities.  Neurological: She is alert.  Confusion as to events.  Skin: Skin is warm.    ED Course  Procedures (including critical care time) Labs Review Labs Reviewed  BASIC METABOLIC PANEL - Abnormal; Notable for the following:    Glucose, Bld 175 (*)    BUN 29 (*)    Creatinine, Ser 1.93 (*)    GFR calc non Af Amer 22 (*)    GFR calc Af Amer 26 (*)    All other components within normal limits  CBC WITH DIFFERENTIAL/PLATELET - Abnormal; Notable for the following:    RBC 3.29 (*)    Hemoglobin 11.2 (*)    HCT 34.2 (*)    MCV 104.0 (*)    Neutrophils Relative % 87 (*)    Neutro Abs 8.1 (*)    Lymphocytes Relative 9 (*)    All other components within normal limits  CK - Abnormal; Notable for the following:    Total CK 214 (*)    All other components within normal limits  URINALYSIS, ROUTINE W REFLEX MICROSCOPIC - Abnormal; Notable for the following:    Color, Urine ORANGE (*)    APPearance TURBID (*)    Hgb urine dipstick MODERATE (*)    Protein, ur 100 (*)    Nitrite POSITIVE (*)    Leukocytes, UA LARGE (*)    All other components within normal limits  URINE MICROSCOPIC-ADD ON - Abnormal; Notable for the following:    Bacteria, UA MANY (*)    All other components within normal limits  VITAMIN B12 - Abnormal; Notable for the following:    Vitamin B-12 1934 (*)    All other components within normal limits  IRON AND TIBC - Abnormal; Notable for the following:    Iron 33 (*)    Saturation Ratios 12 (*)    All other components within normal limits  RETICULOCYTES - Abnormal; Notable for the following:    RBC. 2.44 (*)    All other components within normal limits  PHOSPHORUS - Abnormal; Notable for the following:    Phosphorus 1.7 (*)    All other components within normal limits  HEPATIC FUNCTION PANEL - Abnormal; Notable for the following:    Total Protein 5.2 (*)    Albumin 2.2 (*)    All other components within normal limits  BASIC METABOLIC  PANEL - Abnormal; Notable for the following:    Glucose, Bld 166 (*)    BUN 25 (*)    Creatinine, Ser 1.77 (*)    Calcium 8.0 (*)    GFR calc non Af Amer 24 (*)    GFR calc Af Amer 28 (*)    All other components within normal limits  CBC - Abnormal; Notable for the following:    RBC 2.73 (*)    Hemoglobin 9.3 (*)    HCT 28.8 (*)    MCV 105.5 (*)    MCH 34.1 (*)  All other components within normal limits  CK - Abnormal; Notable for the following:    Total CK 619 (*)    All other components within normal limits  GLUCOSE, CAPILLARY - Abnormal; Notable for the following:    Glucose-Capillary 141 (*)    All other components within normal limits  SEDIMENTATION RATE - Abnormal; Notable for the following:    Sed Rate 38 (*)    All other components within normal limits  IMMUNOFIXATION ELECTROPHORESIS - Abnormal; Notable for the following:    Total Protein ELP 5.7 (*)    IgA 522 (*)    All other components within normal limits  PARATHYROID HORMONE, INTACT (NO CA) - Abnormal; Notable for the following:    PTH 87 (*)    All other components within normal limits  KAPPA/LAMBDA LIGHT CHAINS - Abnormal; Notable for the following:    Kappa free light chain 70.94 (*)    Lamda free light chains 31.95 (*)    Kappa, lamda light chain ratio 2.22 (*)    All other components within normal limits  IMMUNOFIXATION ELECTROPHORESIS, URINE (WITH TOT PROT) - Abnormal; Notable for the following:    Total Protein, Urine 139 (*)    Alpha 1, Urine DETECTED (*)    Alpha 2, Urine DETECTED (*)    Beta, Urine DETECTED (*)    Gamma Globulin, Urine DETECTED (*)    All other components within normal limits  GLUCOSE, CAPILLARY - Abnormal; Notable for the following:    Glucose-Capillary 185 (*)    All other components within normal limits  GLUCOSE, CAPILLARY - Abnormal; Notable for the following:    Glucose-Capillary 151 (*)    All other components within normal limits  GLUCOSE, CAPILLARY - Abnormal; Notable  for the following:    Glucose-Capillary 126 (*)    All other components within normal limits  GLUCOSE, CAPILLARY - Abnormal; Notable for the following:    Glucose-Capillary 123 (*)    All other components within normal limits  FOLATE RBC - Abnormal; Notable for the following:    Hematocrit 23.6 (*)    All other components within normal limits  BASIC METABOLIC PANEL - Abnormal; Notable for the following:    Chloride 114 (*)    Glucose, Bld 151 (*)    Creatinine, Ser 1.42 (*)    Calcium 7.7 (*)    GFR calc non Af Amer 32 (*)    GFR calc Af Amer 37 (*)    Anion gap 3 (*)    All other components within normal limits  CBC - Abnormal; Notable for the following:    RBC 2.31 (*)    Hemoglobin 7.9 (*)    HCT 24.7 (*)    MCV 106.9 (*)    MCH 34.2 (*)    Platelets 134 (*)    All other components within normal limits  GLUCOSE, CAPILLARY - Abnormal; Notable for the following:    Glucose-Capillary 143 (*)    All other components within normal limits  GLUCOSE, CAPILLARY - Abnormal; Notable for the following:    Glucose-Capillary 142 (*)    All other components within normal limits  GLUCOSE, CAPILLARY - Abnormal; Notable for the following:    Glucose-Capillary 201 (*)    All other components within normal limits  GLUCOSE, CAPILLARY - Abnormal; Notable for the following:    Glucose-Capillary 179 (*)    All other components within normal limits  BASIC METABOLIC PANEL - Abnormal; Notable for the following:  Chloride 113 (*)    Glucose, Bld 155 (*)    Creatinine, Ser 1.58 (*)    Calcium 8.3 (*)    GFR calc non Af Amer 28 (*)    GFR calc Af Amer 33 (*)    All other components within normal limits  CBC WITH DIFFERENTIAL/PLATELET - Abnormal; Notable for the following:    RBC 2.09 (*)    Hemoglobin 7.1 (*)    HCT 22.1 (*)    MCV 105.7 (*)    Platelets 120 (*)    Eosinophils Relative 6 (*)    All other components within normal limits  URINE CULTURE  SURGICAL PCR SCREEN  URINE CULTURE   PROTIME-INR  TROPONIN I  FOLATE  FERRITIN  MAGNESIUM  HIV ANTIBODY (ROUTINE TESTING)  TECHNOLOGIST SMEAR REVIEW  HEMOGLOBIN A1C  TSH  VITAMIN D 25 HYDROXY  APTT  PREALBUMIN  SODIUM, URINE, RANDOM  CREATININE, URINE, RANDOM  MAGNESIUM  OCCULT BLOOD X 1 CARD TO LAB, STOOL  PROTEIN ELECTROPHORESIS, SERUM  CBC  BASIC METABOLIC PANEL  TYPE AND SCREEN  ABO/RH  PREPARE RBC (CROSSMATCH)    Imaging Review Ct Chest W Contrast  09/14/2014   CLINICAL DATA:  Left-sided chest pain. Right upper lobe opacity by plain film of the chest.  EXAM: CT CHEST WITH CONTRAST  TECHNIQUE: Multidetector CT imaging of the chest was performed during intravenous contrast administration.  CONTRAST:  75 mL OMNIPAQUE IOHEXOL 300 MG/ML  SOLN  COMPARISON:  Single view of the chest 09/11/2014. CT chest 06/23/2008.  FINDINGS: Trace bilateral pleural effusions are seen. There is no pericardial effusion. Heart size is upper normal. Small hiatal hernia is unchanged in appearance. Calcific aortic and coronary atherosclerosis is noted. There is no axillary, hilar or mediastinal lymphadenopathy.  No pulmonary nodule or mass is seen in the right upper lobe or elsewhere in the chest. Opacity seen on prior chest film is likely due to vascular structures. There is no evidence of neoplastic process. The lungs demonstrate only mild dependent atelectasis.  Incidentally imaged upper abdomen is unremarkable. Bones demonstrate a mild inferior endplate compression fracture of T9 with vertebral body height loss with approximately 20%. The fracture is age indeterminate but new since the prior PA and lateral chest.  IMPRESSION: Negative for evidence of neoplasm. Right apical opacity seen on prior chest film is due to vascular structures.  Trace bilateral pleural effusions.  Calcific aortic and coronary atherosclerosis.  Small hiatal hernia.  Mild T9 inferior endplate compression fracture is age indeterminate but new since 2014.   Electronically  Signed   By: Inge Rise M.D.   On: 09/14/2014 07:57     EKG Interpretation   Date/Time:  Sunday September 11 2014 11:17:29 EDT Ventricular Rate:  85 PR Interval:  204 QRS Duration: 109 QT Interval:  381 QTC Calculation: 453 R Axis:   -57 Text Interpretation:  Sinus rhythm Left anterior fascicular block Probable  left ventricular hypertrophy Anterior Q waves, possibly due to LVH  Nonspecific T abnormalities, lateral leads Confirmed by Alvino Chapel  MD,  Ovid Curd [phone]) on 09/11/2014 11:21:46 AM      MDM   Final diagnoses:  Fall  Renal insufficiency  Femur fracture, left, closed, initial encounter    Patient with fall. Proximal left femur fracture. Chest x-ray showed possible mass. Will admit to internal medicine. I discussed with the medicine doctors and orthopedic surgery.    Davonna Belling, MD 09/14/14 2227
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
## 2014-09-11 NOTE — ED Notes (Signed)
Rn did not place foley due to pain

## 2014-09-11 NOTE — ED Notes (Signed)
Per EMS pt fell at home. Pt home nurse reported she was down for 8 hours. Pt noted to have upper anterior L leg pain 6/10.

## 2014-09-11 NOTE — ED Notes (Signed)
Pt off unit with xray 

## 2014-09-12 ENCOUNTER — Inpatient Hospital Stay (HOSPITAL_COMMUNITY): Payer: Medicare (Managed Care) | Admitting: Anesthesiology

## 2014-09-12 ENCOUNTER — Encounter (HOSPITAL_COMMUNITY): Payer: Self-pay | Admitting: Anesthesiology

## 2014-09-12 ENCOUNTER — Encounter (HOSPITAL_COMMUNITY)
Admission: EM | Disposition: A | Discharge status: Skilled Nursing Facility | Payer: Self-pay | Source: Home / Self Care | Attending: Internal Medicine

## 2014-09-12 ENCOUNTER — Inpatient Hospital Stay (HOSPITAL_COMMUNITY): Payer: Medicare (Managed Care)

## 2014-09-12 DIAGNOSIS — E669 Obesity, unspecified: Secondary | ICD-10-CM | POA: Diagnosis present

## 2014-09-12 DIAGNOSIS — M80052A Age-related osteoporosis with current pathological fracture, left femur, initial encounter for fracture: Secondary | ICD-10-CM | POA: Diagnosis not present

## 2014-09-12 HISTORY — PX: FEMUR IM NAIL: SHX1597

## 2014-09-12 LAB — BASIC METABOLIC PANEL
ANION GAP: 5 (ref 5–15)
BUN: 25 mg/dL — ABNORMAL HIGH (ref 6–23)
CALCIUM: 8 mg/dL — AB (ref 8.4–10.5)
CO2: 25 mmol/L (ref 19–32)
Chloride: 111 mmol/L (ref 96–112)
Creatinine, Ser: 1.77 mg/dL — ABNORMAL HIGH (ref 0.50–1.10)
GFR calc non Af Amer: 24 mL/min — ABNORMAL LOW (ref >90)
GFR, EST AFRICAN AMERICAN: 28 mL/min — AB (ref >90)
Glucose, Bld: 166 mg/dL — ABNORMAL HIGH (ref 70–99)
Potassium: 4.3 mmol/L (ref 3.5–5.1)
SODIUM: 141 mmol/L (ref 135–145)

## 2014-09-12 LAB — CBC
HEMATOCRIT: 28.8 % — AB (ref 36.0–46.0)
Hemoglobin: 9.3 g/dL — ABNORMAL LOW (ref 12.0–15.0)
MCH: 34.1 pg — AB (ref 26.0–34.0)
MCHC: 32.3 g/dL (ref 30.0–36.0)
MCV: 105.5 fL — AB (ref 78.0–100.0)
PLATELETS: 171 10*3/uL (ref 150–400)
RBC: 2.73 MIL/uL — ABNORMAL LOW (ref 3.87–5.11)
RDW: 12 % (ref 11.5–15.5)
WBC: 7 10*3/uL (ref 4.0–10.5)

## 2014-09-12 LAB — VITAMIN D 25 HYDROXY (VIT D DEFICIENCY, FRACTURES): Vit D, 25-Hydroxy: 31 ng/mL (ref 30.0–100.0)

## 2014-09-12 LAB — HIV ANTIBODY (ROUTINE TESTING W REFLEX): HIV Screen 4th Generation wRfx: NONREACTIVE

## 2014-09-12 LAB — CREATININE, URINE, RANDOM: Creatinine, Urine: 116.54 mg/dL

## 2014-09-12 LAB — VITAMIN B12: Vitamin B-12: 1934 pg/mL — ABNORMAL HIGH (ref 211–911)

## 2014-09-12 LAB — GLUCOSE, CAPILLARY
GLUCOSE-CAPILLARY: 123 mg/dL — AB (ref 70–99)
GLUCOSE-CAPILLARY: 143 mg/dL — AB (ref 70–99)
Glucose-Capillary: 151 mg/dL — ABNORMAL HIGH (ref 70–99)
Glucose-Capillary: 126 mg/dL — ABNORMAL HIGH (ref 70–99)
Glucose-Capillary: 142 mg/dL — ABNORMAL HIGH (ref 70–99)

## 2014-09-12 LAB — FERRITIN: Ferritin: 90 ng/mL (ref 10–291)

## 2014-09-12 LAB — FOLATE: Folate: >20 ng/mL

## 2014-09-12 LAB — SURGICAL PCR SCREEN
MRSA, PCR: NEGATIVE
STAPHYLOCOCCUS AUREUS: NEGATIVE

## 2014-09-12 LAB — HEMOGLOBIN A1C
Hgb A1c MFr Bld: 5.6 % (ref 4.8–5.6)
Mean Plasma Glucose: 114 mg/dL

## 2014-09-12 LAB — SEDIMENTATION RATE: Sed Rate: 38 mm/hr — ABNORMAL HIGH (ref 0–22)

## 2014-09-12 LAB — SODIUM, URINE, RANDOM: SODIUM UR: 113 mmol/L

## 2014-09-12 LAB — CK: Total CK: 619 U/L — ABNORMAL HIGH (ref 7–177)

## 2014-09-12 SURGERY — INSERTION, INTRAMEDULLARY ROD, FEMUR
Anesthesia: General | Laterality: Left

## 2014-09-12 MED ORDER — ACETAMINOPHEN 650 MG RE SUPP: 650.0000 mg | Freq: Four times a day (QID) | RECTAL | Status: DC | PRN

## 2014-09-12 MED ORDER — ENOXAPARIN SODIUM 40 MG/0.4ML ~~LOC~~ SOLN
40.0000 mg | SUBCUTANEOUS | Status: DC
  Administered 2014-09-13 – 2014-09-14 (×2): 40 mg via SUBCUTANEOUS
  Filled 2014-09-12 (×2): qty 0.4

## 2014-09-12 MED ORDER — MENTHOL 3 MG MT LOZG: 1.0000 | LOZENGE | OROMUCOSAL | Status: DC | PRN

## 2014-09-12 MED ORDER — ONDANSETRON HCL 4 MG PO TABS: 4.0000 mg | ORAL_TABLET | Freq: Four times a day (QID) | ORAL | Status: DC | PRN

## 2014-09-12 MED ORDER — SODIUM CHLORIDE 0.9 % IV SOLN
INTRAVENOUS | Status: DC
  Administered 2014-09-12 – 2014-09-13 (×2): via INTRAVENOUS

## 2014-09-12 MED ORDER — FENTANYL CITRATE 0.05 MG/ML IJ SOLN
INTRAMUSCULAR | Status: AC
  Filled 2014-09-12: qty 5

## 2014-09-12 MED ORDER — METHOCARBAMOL 1000 MG/10ML IJ SOLN
500.0000 mg | Freq: Four times a day (QID) | INTRAVENOUS | Status: DC | PRN
  Filled 2014-09-12: qty 5

## 2014-09-12 MED ORDER — ACETAMINOPHEN 325 MG PO TABS: 650.0000 mg | ORAL_TABLET | Freq: Four times a day (QID) | ORAL | Status: DC | PRN

## 2014-09-12 MED ORDER — VANCOMYCIN HCL IN DEXTROSE 1-5 GM/200ML-% IV SOLN
1000.0000 mg | Freq: Two times a day (BID) | INTRAVENOUS | Status: AC
  Administered 2014-09-12: 1000 mg via INTRAVENOUS
  Filled 2014-09-12: qty 200

## 2014-09-12 MED ORDER — METHOCARBAMOL 500 MG PO TABS
500.0000 mg | ORAL_TABLET | Freq: Four times a day (QID) | ORAL | Status: DC | PRN
  Administered 2014-09-13 – 2014-09-19 (×4): 500 mg via ORAL
  Filled 2014-09-12 (×4): qty 1

## 2014-09-12 MED ORDER — ROCURONIUM BROMIDE 100 MG/10ML IV SOLN
INTRAVENOUS | Status: DC | PRN
  Administered 2014-09-12: 40 mg via INTRAVENOUS

## 2014-09-12 MED ORDER — LIDOCAINE HCL (CARDIAC) 20 MG/ML IV SOLN
INTRAVENOUS | Status: DC | PRN
  Administered 2014-09-12: 80 mg via INTRAVENOUS

## 2014-09-12 MED ORDER — ENSURE ENLIVE PO LIQD
237.0000 mL | Freq: Two times a day (BID) | ORAL | Status: DC
  Administered 2014-09-13 – 2014-09-19 (×12): 237 mL via ORAL

## 2014-09-12 MED ORDER — SODIUM CHLORIDE 0.9 % IV SOLN
INTRAVENOUS | Status: DC
  Administered 2014-09-12: 16:00:00 via INTRAVENOUS

## 2014-09-12 MED ORDER — FENTANYL CITRATE 0.05 MG/ML IJ SOLN
INTRAMUSCULAR | Status: DC | PRN
  Administered 2014-09-12: 50 ug via INTRAVENOUS
  Administered 2014-09-12: 25 ug via INTRAVENOUS
  Administered 2014-09-12 (×3): 50 ug via INTRAVENOUS
  Administered 2014-09-12: 25 ug via INTRAVENOUS

## 2014-09-12 MED ORDER — METOCLOPRAMIDE HCL 5 MG/ML IJ SOLN: 5.0000 mg | Freq: Three times a day (TID) | INTRAMUSCULAR | Status: DC | PRN

## 2014-09-12 MED ORDER — ALUM & MAG HYDROXIDE-SIMETH 200-200-20 MG/5ML PO SUSP
30.0000 mL | ORAL | Status: DC | PRN
Start: 2014-09-12 — End: 2014-09-19

## 2014-09-12 MED ORDER — SODIUM CHLORIDE 0.9 % IV SOLN: INTRAVENOUS | Status: DC

## 2014-09-12 MED ORDER — MIDAZOLAM HCL 2 MG/2ML IJ SOLN
INTRAMUSCULAR | Status: AC
  Filled 2014-09-12: qty 2

## 2014-09-12 MED ORDER — NEOSTIGMINE METHYLSULFATE 10 MG/10ML IV SOLN
INTRAVENOUS | Status: DC | PRN
  Administered 2014-09-12: 5 mg via INTRAVENOUS

## 2014-09-12 MED ORDER — HYDROCODONE-ACETAMINOPHEN 5-325 MG PO TABS: 1.0000 | ORAL_TABLET | Freq: Four times a day (QID) | ORAL | Status: DC | PRN

## 2014-09-12 MED ORDER — GLYCOPYRROLATE 0.2 MG/ML IJ SOLN
INTRAMUSCULAR | Status: DC | PRN
  Administered 2014-09-12: .8 mg via INTRAVENOUS

## 2014-09-12 MED ORDER — VANCOMYCIN HCL IN DEXTROSE 1-5 GM/200ML-% IV SOLN: 1000.0000 mg | INTRAVENOUS | Status: DC

## 2014-09-12 MED ORDER — PROPOFOL 10 MG/ML IV BOLUS
INTRAVENOUS | Status: DC | PRN
  Administered 2014-09-12: 80 mg via INTRAVENOUS

## 2014-09-12 MED ORDER — VANCOMYCIN HCL IN DEXTROSE 1-5 GM/200ML-% IV SOLN
INTRAVENOUS | Status: AC
  Administered 2014-09-12: 1000 mg via INTRAVENOUS
  Filled 2014-09-12: qty 200

## 2014-09-12 MED ORDER — PROMETHAZINE HCL 25 MG/ML IJ SOLN: 6.2500 mg | INTRAMUSCULAR | Status: DC | PRN

## 2014-09-12 MED ORDER — ONDANSETRON HCL 4 MG/2ML IJ SOLN: 4.0000 mg | Freq: Four times a day (QID) | INTRAMUSCULAR | Status: DC | PRN

## 2014-09-12 MED ORDER — ONDANSETRON HCL 4 MG/2ML IJ SOLN
INTRAMUSCULAR | Status: DC | PRN
  Administered 2014-09-12: 4 mg via INTRAVENOUS

## 2014-09-12 MED ORDER — PROPOFOL 10 MG/ML IV BOLUS
INTRAVENOUS | Status: AC
  Filled 2014-09-12: qty 20

## 2014-09-12 MED ORDER — HYDROCODONE-ACETAMINOPHEN 7.5-325 MG PO TABS: 1.0000 | ORAL_TABLET | Freq: Four times a day (QID) | ORAL | Status: DC | PRN

## 2014-09-12 MED ORDER — MORPHINE SULFATE 2 MG/ML IJ SOLN: 0.5000 mg | INTRAMUSCULAR | Status: DC | PRN

## 2014-09-12 MED ORDER — OXYCODONE HCL 5 MG PO TABS
5.0000 mg | ORAL_TABLET | ORAL | Status: DC | PRN
  Administered 2014-09-13 – 2014-09-19 (×21): 10 mg via ORAL
  Filled 2014-09-12 (×21): qty 2

## 2014-09-12 MED ORDER — METOCLOPRAMIDE HCL 5 MG PO TABS: 5.0000 mg | ORAL_TABLET | Freq: Three times a day (TID) | ORAL | Status: DC | PRN

## 2014-09-12 MED ORDER — 0.9 % SODIUM CHLORIDE (POUR BTL) OPTIME
TOPICAL | Status: DC | PRN
  Administered 2014-09-12: 200 mL

## 2014-09-12 MED ORDER — ONDANSETRON HCL 4 MG/2ML IJ SOLN
INTRAMUSCULAR | Status: AC
  Filled 2014-09-12: qty 2

## 2014-09-12 MED ORDER — FENTANYL CITRATE 0.05 MG/ML IJ SOLN: 25.0000 ug | INTRAMUSCULAR | Status: DC | PRN

## 2014-09-12 MED ORDER — PHENOL 1.4 % MT LIQD: 1.0000 | OROMUCOSAL | Status: DC | PRN

## 2014-09-12 MED ORDER — LACTATED RINGERS IV SOLN
INTRAVENOUS | Status: DC | PRN
  Administered 2014-09-12: 16:00:00 via INTRAVENOUS

## 2014-09-12 MED ORDER — CHLORHEXIDINE GLUCONATE 4 % EX LIQD
60.0000 mL | Freq: Once | CUTANEOUS | Status: DC
  Filled 2014-09-12: qty 60

## 2014-09-12 MED ORDER — ENOXAPARIN SODIUM 40 MG/0.4ML ~~LOC~~ SOLN: 40.0000 mg | Freq: Every day | SUBCUTANEOUS | Status: DC

## 2014-09-12 MED ORDER — PHENYLEPHRINE HCL 10 MG/ML IJ SOLN
INTRAMUSCULAR | Status: DC | PRN
  Administered 2014-09-12 (×2): 80 ug via INTRAVENOUS

## 2014-09-12 SURGICAL SUPPLY — 47 items
BIT DRILL LONG 4.0MM (BIT) ×2 IMPLANT
BIT DRILL SHORT 4.0 (BIT) ×2 IMPLANT
BNDG COHESIVE 4X5 TAN STRL (GAUZE/BANDAGES/DRESSINGS) ×3 IMPLANT
COVER PERINEAL POST (MISCELLANEOUS) ×3 IMPLANT
COVER SURGICAL LIGHT HANDLE (MISCELLANEOUS) ×3 IMPLANT
DRAPE C-ARM 42X72 X-RAY (DRAPES) ×3 IMPLANT
DRAPE C-ARMOR (DRAPES) ×3 IMPLANT
DRAPE INCISE IOBAN 66X45 STRL (DRAPES) ×3 IMPLANT
DRAPE ORTHO SPLIT 77X108 STRL (DRAPES)
DRAPE PROXIMA HALF (DRAPES) IMPLANT
DRAPE STERI IOBAN 125X83 (DRAPES) ×3 IMPLANT
DRAPE SURG ORHT 6 SPLT 77X108 (DRAPES) IMPLANT
DRAPE U-SHAPE 47X51 STRL (DRAPES) ×3 IMPLANT
DRILL BIT LONG 4.0MM (BIT) ×6
DRILL BIT SHORT 4.0 (BIT) ×4
DRSG EMULSION OIL 3X3 NADH (GAUZE/BANDAGES/DRESSINGS) ×3 IMPLANT
DRSG MEPILEX BORDER 4X4 (GAUZE/BANDAGES/DRESSINGS) ×6 IMPLANT
DRSG MEPILEX BORDER 4X8 (GAUZE/BANDAGES/DRESSINGS) ×3 IMPLANT
DURAPREP 26ML APPLICATOR (WOUND CARE) ×3 IMPLANT
ELECT CAUTERY BLADE 6.4 (BLADE) IMPLANT
ELECT REM PT RETURN 9FT ADLT (ELECTROSURGICAL) ×3
ELECTRODE REM PT RTRN 9FT ADLT (ELECTROSURGICAL) ×1 IMPLANT
FACESHIELD WRAPAROUND (MASK) ×3 IMPLANT
GLOVE NEODERM STRL 7.5 LF PF (GLOVE) ×1 IMPLANT
GLOVE SURG NEODERM 7.5  LF PF (GLOVE) ×2
GOWN STRL REIN XL XLG (GOWN DISPOSABLE) ×6 IMPLANT
GUIDE PIN 3.2MM (MISCELLANEOUS) ×2
GUIDE PIN ORTH 343X3.2XBRAD (MISCELLANEOUS) ×1 IMPLANT
KIT BASIN OR (CUSTOM PROCEDURE TRAY) ×3 IMPLANT
KIT ROOM TURNOVER OR (KITS) ×3 IMPLANT
LINER BOOT UNIVERSAL DISP (MISCELLANEOUS) ×3 IMPLANT
MANIFOLD NEPTUNE II (INSTRUMENTS) IMPLANT
NAIL FEMORAL 11.5X40 (Nail) ×3 IMPLANT
NS IRRIG 1000ML POUR BTL (IV SOLUTION) ×3 IMPLANT
PACK GENERAL/GYN (CUSTOM PROCEDURE TRAY) ×3 IMPLANT
PAD ARMBOARD 7.5X6 YLW CONV (MISCELLANEOUS) ×6 IMPLANT
SCREW 6.4X100 (Screw) ×3 IMPLANT
SCREW LOCKING 5.0X80 (Screw) ×3 IMPLANT
SCREW TRIGEN LOW PROF 5.0X42.5 (Screw) ×3 IMPLANT
SCREW TRIGEN LOW PROF 5.0X47.5 (Screw) ×3 IMPLANT
STAPLER VISISTAT 35W (STAPLE) IMPLANT
STOCKINETTE IMPERVIOUS 9X36 MD (GAUZE/BANDAGES/DRESSINGS) IMPLANT
SUT VIC AB 0 CT1 27 (SUTURE) ×2
SUT VIC AB 0 CT1 27XBRD ANBCTR (SUTURE) ×1 IMPLANT
SUT VIC AB 2-0 CT1 27 (SUTURE) ×2
SUT VIC AB 2-0 CT1 TAPERPNT 27 (SUTURE) ×1 IMPLANT
WATER STERILE IRR 1000ML POUR (IV SOLUTION) IMPLANT

## 2014-09-12 NOTE — Anesthesia Preprocedure Evaluation (Signed)
Anesthesia Evaluation  Patient identified by MRN, date of birth, ID band Patient awake    Reviewed: Allergy & Precautions, NPO status , Patient's Chart, lab work & pertinent test results  Airway Mallampati: II  TM Distance: >3 FB Neck ROM: Full    Dental no notable dental hx.    Pulmonary neg pulmonary ROS, former smoker,  breath sounds clear to auscultation  Pulmonary exam normal       Cardiovascular hypertension, Pt. on medications Rhythm:Regular Rate:Normal  a stress nuclear study in August 2014 was normal with no evidence of ischemia.   Neuro/Psych negative neurological ROS  negative psych ROS   GI/Hepatic negative GI ROS, Neg liver ROS,   Endo/Other  diabetesHypothyroidism   Renal/GU Renal InsufficiencyRenal disease  negative genitourinary   Musculoskeletal negative musculoskeletal ROS (+)   Abdominal   Peds negative pediatric ROS (+)  Hematology  (+) anemia ,   Anesthesia Other Findings   Reproductive/Obstetrics negative OB ROS                             Anesthesia Physical Anesthesia Plan  ASA: III  Anesthesia Plan: General   Post-op Pain Management:    Induction: Intravenous  Airway Management Planned: Oral ETT  Additional Equipment:   Intra-op Plan:   Post-operative Plan: Extubation in OR  Informed Consent: I have reviewed the patients History and Physical, chart, labs and discussed the procedure including the risks, benefits and alternatives for the proposed anesthesia with the patient or authorized representative who has indicated his/her understanding and acceptance.   Dental advisory given  Plan Discussed with: CRNA and Surgeon  Anesthesia Plan Comments:         Anesthesia Quick Evaluation
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
## 2014-09-12 NOTE — Op Note (Signed)
   Date of Surgery: 09/12/2014  INDICATIONS: Alexandra Booth is a 81 y.o.-year-old female who was involved in a ground level fall and sustained a left femur fracture. The risks and benefits of the procedure discussed with the patient prior to the procedure and all questions were answered; consent was obtained.  PREOPERATIVE DIAGNOSIS:  left femur fracture  POSTOPERATIVE DIAGNOSIS: Same  PROCEDURE:  left femur closed reduction and intramedullary nailing.  CPT [phone]  SURGEON: N. Eduard Roux, M.D.  ASSISTANT: Ky Barban, RNFA,   ANESTHESIA:  general  IV FLUIDS AND URINE: See anesthesia record.  ESTIMATED BLOOD LOSS: 200 mL.  IMPLANTS: [employer] 11.5 x 40 recon nail 100/95.   DRAINS: None.  COMPLICATIONS: None.  DESCRIPTION OF PROCEDURE: The patient was brought to the operating room and placed supine on the operating table.  The patient's leg had been signed prior to the procedure and this was documented.  The patient had the anesthesia placed by the anesthesiologist.  The prep verification and incision time-outs were performed to confirm that this was the correct patient, site, side and location. The patient had an SCD on the opposite lower extremity. The patient did receive antibiotics prior to the incision and was re-dosed during the procedure as needed at indicated intervals.  The patient had the lower extremity prepped and draped in the standard surgical fashion.  An incision 4 finger breaths superior to the greater trochanter  was made. A guidepin was placed on the tip of the greater trochanter and confirmed on fluoroscopy on both AP and lateral views. The guide pin was advanced down to the proximal femur. We then used an entry reamer to gain entry into the femoral canal. We then advanced a guidewire down the proximal femur. We then affected a reduction of the fracture by using a small incision just lateral to the fracture. We use a bone hook to pulled the proximal segment laterally  and a mallet to push the distal segment medially. With the fracture reduced we advanced the guidewire across the fracture and down into the distal femur to the level of the physes of scar. We then measured the length and nail to be 40 cm. We then sequentially reamed up to a 13 mm reamer the femoral canal until there was adequate chatter.  We then inserted the nail with the fracture reduced over the guidewire under fluoroscopic guidance. We then sunk the nail down to the appropriate depth. We then found the appropriate version of the recon screws on the lateral hip x-ray. We sequentially foot and the 2 recon screws up into the femoral neck and head. We then confirmed of the fracture stayed reduced. We then placed 2 distal interlocking screws using the perfect circle technique. Final x-rays were taken. The wounds were thoroughly irrigated. They were closed in layer fashion using 0 Vicryl for the subcutaneous fat, 2-0 Vicryl for the subcutaneous layer and staples for the skin. Sterile dressings were applied. Patient was experiencing transferred to the PACU in stable condition. All sponge counts were correct.  POSTOPERATIVE PLAN: [name] will be WBAT and will return 2 weeks for suture removal;  she will not need any x-rays at that time.  [name] will receive DVT prophylaxis based on other medications, activity level, and risk ratio of bleeding to thrombosis.

## 2014-09-12 NOTE — H&P (Signed)
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
## 2014-09-12 NOTE — Progress Notes (Signed)
INITIAL NUTRITION ASSESSMENT  DOCUMENTATION CODES Per approved criteria  -Obesity Unspecified   INTERVENTION: Once diet advances, provide Ensure Enlive po BID, each supplement provides 350 kcal and 20 grams of protein.  NUTRITION DIAGNOSIS: Increased nutrient needs related to surgery as evidenced by estimated nutrition needs.   Goal: Pt to meet >/= 90% of their estimated nutrition needs   Monitor:  Diet advancement, weight trends, labs, I/O's  Reason for Assessment: MD consult for assessment of nutrition requirements/status  81 y.o. female  Admitting Dx: Fracture, proximal femur  ASSESSMENT: Pt with past medical history of hypertension, anxiety/depression, chronic macrocytic anemia, non-insulin dependent Type 2 DM, hypothyroidism on replacement, CAD, hyperlipidemia, urinary incontinence, chronic low back pain, and GERD who presents with fall, left leg pain, and urinary symptoms.   Pt is currently NPO for surgery today. Pt reports she has been having a decreased appetite over the past 1 year. Pt reports she has been mostly eating 1 meal a day (frozen meal, such as Hungry man) with crackers as a snack. Pt reports she usually drinks Ensure 1-3 times weekly. Pt reports usual body weight of 200 lbs. Pt was weighed on bed scale during time of visit, 215 lbs. Pt is agreeable to Ensure once diet advanced. RD to order. Of note, pt was offered Glucerna, however declined.   Pt with no observed significant fat or muscle mass loss.  Labs: Low calcium and GFR. High BUN and creatinine.  Height: Ht Readings from Last 1 Encounters:  01/21/13 5' 9.5" (1.765 m)    Weight: Wt Readings from Last 1 Encounters:  01/21/13 194 lb (87.998 kg)  4/4 Bed scale 215 lbs (97.73 kg)  Ideal Body Weight: 148 lbs  % Ideal Body Weight: 145%  Wt Readings from Last 10 Encounters:  01/21/13 194 lb (87.998 kg)  10/17/08 206 lb 0.2 oz (93.447 kg)  09/07/08 210 lb 9.6 oz (95.528 kg)  07/21/08 211 lb 14.4  oz (96.117 kg)  05/17/08 207 lb 3.2 oz (93.985 kg)  02/05/08 206 lb 6.4 oz (93.622 kg)  01/13/08 209 lb 1.6 oz (94.847 kg)  09/21/07 202 lb 8 oz (91.853 kg)  07/28/07 200 lb 0.2 oz (90.725 kg)  06/24/07 202 lb 11.2 oz (91.944 kg)    Usual Body Weight: 200 lbs  % Usual Body Weight: 108%  BMI:  Body Mass Index: 31.42 kg/(m^2) Class I obesity  Estimated Nutritional Needs: Kcal: 1850-2050 Protein: 90-100 grams Fluid: 1.8 - 2 L/day  Skin: Intact  Diet Order: Diet NPO time specified  EDUCATION NEEDS: -No education needs identified at this time   Intake/Output Summary (Last 24 hours) at 09/12/14 1044 Last data filed at 09/12/14 0655  Gross per 24 hour  Intake   1500 ml  Output      0 ml  Net   1500 ml    Last BM: 4/1  Labs:   Recent Labs Lab 09/11/14 1130 09/11/14 1735 09/12/14 0511  NA 140  --  141  K 4.4  --  4.3  CL 109  --  111  CO2 19  --  25  BUN 29*  --  25*  CREATININE 1.93*  --  1.77*  CALCIUM 8.8  --  8.0*  MG  --  1.5  --   PHOS  --  1.7*  --   GLUCOSE 175*  --  166*    CBG (last 3)   Recent Labs  09/11/14 1712 09/11/14 2256 09/12/14 0712  GLUCAP 141* 185* 151*  Scheduled Meds: . aspirin  81 mg Oral Daily  . calcium carbonate  1 tablet Oral Q breakfast  . cefTRIAXone (ROCEPHIN)  IV  1 g Intravenous Q24H  . cholecalciferol  1,000 Units Oral Daily  . cyanocobalamin  500 mcg Oral Daily  . darifenacin  7.5 mg Oral Daily  . escitalopram  20 mg Oral Daily  . levothyroxine  25 mcg Oral QAC breakfast  . mirtazapine  15 mg Oral Daily  . multivitamin with minerals  1 tablet Oral Daily  . pantoprazole  40 mg Oral Daily  . pneumococcal 23 valent vaccine  0.5 mL Intramuscular Tomorrow-1000    Continuous Infusions: . sodium chloride 1,000 mL (09/12/14 0655)    History reviewed. No pertinent past medical history.  Past Surgical History  Procedure Laterality Date  . Laminectomy      Kallie Locks, MS, RD, LDN Pager # [phone] After  hours/ weekend pager # [phone]

## 2014-09-12 NOTE — Progress Notes (Signed)
Report from floor nurse that patient has DM, HTN as listed on SBAR. Noted that patient history was not updated in epic. Requested that history be updated in computer.

## 2014-09-12 NOTE — Anesthesia Postprocedure Evaluation (Signed)
  Anesthesia Post-op Note  Patient: Alexandra Booth  Procedure(s) Performed: Procedure(s): INTRAMEDULLARY (IM) NAIL FEMORAL (Left)  Patient Location: PACU  Anesthesia Type:General  Level of Consciousness: awake and alert   Airway and Oxygen Therapy: Patient Spontanous Breathing  Post-op Pain: mild  Post-op Assessment: Post-op Vital signs reviewed  Post-op Vital Signs: Reviewed  Last Vitals:  Filed Vitals:   09/12/14 2104  BP: 107/40  Pulse: 86  Temp: 36.7 C  Resp:     Complications: No apparent anesthesia complications

## 2014-09-12 NOTE — Progress Notes (Signed)
Yellow colored necklace with cross pendant removed from neck and placed on chart

## 2014-09-12 NOTE — Transfer of Care (Signed)
Immediate Anesthesia Transfer of Care Note  Patient: Alexandra Booth  Procedure(s) Performed: Procedure(s): INTRAMEDULLARY (IM) NAIL FEMORAL (Left)  Patient Location: PACU  Anesthesia Type:General  Level of Consciousness: awake, alert , oriented and patient cooperative  Airway & Oxygen Therapy: Patient Spontanous Breathing and Patient connected to nasal cannula oxygen  Post-op Assessment: Report given to RN and Post -op Vital signs reviewed and stable  Post vital signs: Reviewed and stable  Last Vitals:  Filed Vitals:   09/12/14 1843  BP:   Pulse: 96  Temp: 37.2 C  Resp: 18    Complications: No apparent anesthesia complications

## 2014-09-13 ENCOUNTER — Encounter (HOSPITAL_COMMUNITY): Payer: Self-pay | Admitting: Orthopaedic Surgery

## 2014-09-13 LAB — URINE CULTURE
Colony Count: NO GROWTH
Culture: NO GROWTH

## 2014-09-13 LAB — KAPPA/LAMBDA LIGHT CHAINS
KAPPA FREE LGHT CHN: 70.94 mg/L — AB (ref 3.30–19.40)
Kappa, lambda light chain ratio: 2.22 — ABNORMAL HIGH (ref 0.26–1.65)
Lambda free light chains: 31.95 mg/L — ABNORMAL HIGH (ref 5.71–26.30)

## 2014-09-13 LAB — PARATHYROID HORMONE, INTACT (NO CA): PTH: 87 pg/mL — AB (ref 15–65)

## 2014-09-13 LAB — IMMUNOFIXATION ELECTROPHORESIS
IGA: 522 mg/dL — AB (ref 64–422)
IGG (IMMUNOGLOBIN G), SERUM: 1173 mg/dL (ref 700–1600)
IgM, Serum: 99 mg/dL (ref 26–217)
Total Protein ELP: 5.7 g/dL — ABNORMAL LOW (ref 6.0–8.5)

## 2014-09-13 LAB — CBC
HCT: 24.7 % — ABNORMAL LOW (ref 36.0–46.0)
Hemoglobin: 7.9 g/dL — ABNORMAL LOW (ref 12.0–15.0)
MCH: 34.2 pg — AB (ref 26.0–34.0)
MCHC: 32 g/dL (ref 30.0–36.0)
MCV: 106.9 fL — AB (ref 78.0–100.0)
PLATELETS: 134 10*3/uL — AB (ref 150–400)
RBC: 2.31 MIL/uL — ABNORMAL LOW (ref 3.87–5.11)
RDW: 12.1 % (ref 11.5–15.5)
WBC: 7.6 10*3/uL (ref 4.0–10.5)

## 2014-09-13 LAB — BASIC METABOLIC PANEL
ANION GAP: 3 — AB (ref 5–15)
BUN: 17 mg/dL (ref 6–23)
CO2: 23 mmol/L (ref 19–32)
Calcium: 7.7 mg/dL — ABNORMAL LOW (ref 8.4–10.5)
Chloride: 114 mmol/L — ABNORMAL HIGH (ref 96–112)
Creatinine, Ser: 1.42 mg/dL — ABNORMAL HIGH (ref 0.50–1.10)
GFR calc Af Amer: 37 mL/min — ABNORMAL LOW (ref >90)
GFR calc non Af Amer: 32 mL/min — ABNORMAL LOW (ref >90)
Glucose, Bld: 151 mg/dL — ABNORMAL HIGH (ref 70–99)
Potassium: 4.4 mmol/L (ref 3.5–5.1)
Sodium: 140 mmol/L (ref 135–145)

## 2014-09-13 LAB — GLUCOSE, CAPILLARY
GLUCOSE-CAPILLARY: 201 mg/dL — AB (ref 70–99)
Glucose-Capillary: 179 mg/dL — ABNORMAL HIGH (ref 70–99)

## 2014-09-13 LAB — MAGNESIUM: MAGNESIUM: 2 mg/dL (ref 1.5–2.5)

## 2014-09-13 LAB — PREALBUMIN: Prealbumin: 16 mg/dL (ref 18.0–45.0)
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
## 2014-09-13 NOTE — Care Management Note (Signed)
CARE MANAGEMENT NOTE 09/13/2014  Patient:  Alexandra Booth, Alexandra Booth   Account Number:  [id number]  Date Initiated:  09/13/2014  Documentation initiated by:  Ricki Miller  Subjective/Objective Assessment:   81 yr old female admitted s/p fall with a left femur fracture. Patient underwent a closed reduction of left femur fracture and IM Nailing.     Action/Plan:   Patient will need shortterm rehab, will need SNF. Social worker is aware.   Anticipated DC Date:  09/14/2014   Anticipated DC Plan:  SKILLED NURSING FACILITY  In-house referral  Clinical Social Worker      DC Planning Services  CM consult      Choice offered to / List presented to:     DME arranged  NA        [place] arranged  NA      Status of service:  Completed, signed off Medicare Important Message given?   (If response is "NO", the following Medicare IM given date fields will be blank) Date Medicare IM given:   Medicare IM given by:   Date Additional Medicare IM given:   Additional Medicare IM given by:    Discharge Disposition:  [place]  Per UR Regulation:  Reviewed for med. necessity/level of care/duration of stay
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
## 2014-09-13 NOTE — Progress Notes (Signed)
   Subjective:  Patient reports pain as moderate.  No events.  Objective:   VITALS:   Filed Vitals:   09/12/14 2104 09/13/14 0000 09/13/14 0120 09/13/14 0400  BP: 107/40  120/45   Pulse: 86  86   Temp: 98 F (36.7 C)  98.2 F (36.8 C)   TempSrc: Axillary  Oral   Resp:  10  10  SpO2: 95% 98% 98% 98%    Neurologically intact Neurovascular intact Sensation intact distally Intact pulses distally Dorsiflexion/Plantar flexion intact Incision: dressing C/D/I and no drainage No cellulitis present Compartment soft   Lab Results  Component Value Date   WBC 7.0 09/12/2014   HGB 9.3* 09/12/2014   HCT 28.8* 09/12/2014   MCV 105.5* 09/12/2014   PLT 171 09/12/2014     Assessment/Plan:  1 Day Post-Op   - Expected postop acute blood loss anemia - will monitor for symptoms - Up with PT/OT - DVT ppx - SCDs, ambulation, lovenox - TDWB left lower extremity - Pain control - Discharge planning  Marianna Payment 09/13/2014, 7:44 AM [phone]

## 2014-09-13 NOTE — Care Management Utilization Note (Signed)
Utilization review completed.  

## 2014-09-13 NOTE — Evaluation (Signed)
Physical Therapy Evaluation Patient Details Name: Alexandra Booth MRN: [id number] DOB: Mar 14, 1924 Today's Date: 09/13/2014   History of Present Illness  81 y.o. female admitted to [facility] on 09/11/14 s/p fall with left femur fx.  She is now s/p IM nail and is TDWB.  She has significant PMHx of lumbar leminectomy.  No other history listed in chart.  Clinical Impression  Pt is total assist just to sit EOB and return to supine.  She is not read for attempts at stand and at this point she is not going to be strong enough to maintain TDWB status on left leg.  I am recommending SNF level rehab and lift for safe OOB mobility at this time.  PT will continue to follow acutely to work on bed mobility exercises and attempts at standing and pre-gait.     Follow Up Recommendations SNF    Equipment Recommendations  Hospital bed;Other (comment) (hoyer lift)    Recommendations for Other Services   NA    Precautions / Restrictions Precautions Precautions: Fall Restrictions LLE Weight Bearing: Touchdown weight bearing      Mobility  Bed Mobility Overal bed mobility: Needs Assistance Bed Mobility: Supine to Sit;Sit to Supine     Supine to sit: Total assist;HOB elevated Sit to supine: Total assist   General bed mobility comments: HOB maximally elevated and using bed pad to control movement at the hips.  Pt very guarded, manual facilitation for hand placment, but pt did not maintain, would let go and stopped helping at the trunk, turned into resisting motion forward.   Transfers                 General transfer comment: Not tested due to significant help needed to get EOB.  Safest at this point to use maxi move total lift for OOB mobility.          Balance Overall balance assessment: Needs assistance Sitting-balance support: Feet supported;Bilateral upper extremity supported Sitting balance-Leahy Scale: Poor Sitting balance - Comments: min assist, pt weight shifted right off of painful  leg.  Postural control: Right lateral lean                                   Pertinent Vitals/Pain Pain Assessment: 0-10 Pain Score: 10-Worst pain ever Pain Location: left leg and hip Pain Descriptors / Indicators: Aching;Burning Pain Intervention(s): Limited activity within patient's tolerance;Monitored during session;Repositioned    Home Living Family/patient expects to be discharged to:: Private residence Living Arrangements: Children (daughter "upstairs") Available Help at Discharge: Family;Available PRN/intermittently Type of Home: House Home Access: Ramped entrance     Home Layout: Two level;Other (Comment) (daughter lives upstairs) Home Equipment: Environmental consultant - 2 wheels;Bedside commode;Shower seat;Wheelchair - manual Additional Comments: has an aide 5 days per week x 2 hours per day.     Prior Function Level of Independence: Needs assistance   Gait / Transfers Assistance Needed: used RW full time for gait.      Comments: active with [facility] program on Tuesdays and Thursdays.     Hand Dominance   Dominant Hand: Right    Extremity/Trunk Assessment   Upper Extremity Assessment: Generalized weakness           Lower Extremity Assessment: RLE deficits/detail;LLE deficits/detail RLE Deficits / Details: right leg generally weak, will likely be unable to support most of her body weight on just this leg given TDWB status of left leg  post operatively due to both strength and her weight.  LLE Deficits / Details: left leg with normal post op pain and weakness, assessment limited by pain and guarding.  Ankle 2+/5, knee 2+/5, hip 2-/5  Cervical / Trunk Assessment: Other exceptions  Communication   Communication: No difficulties  Cognition Arousal/Alertness: Awake/alert Behavior During Therapy: WFL for tasks assessed/performed Overall Cognitive Status: No family/caregiver present to determine baseline cognitive functioning                      General  Comments General comments (skin integrity, edema, etc.): O2 sats decreased to 86% on RA during mobility. Pt reports she does not use O2 at baseline.     Exercises Total Joint Exercises Ankle Circles/Pumps: AROM;AAROM;Right;Left;10 reps;Supine;Seated Heel Slides: AAROM;Left;10 reps;Supine Hip ABduction/ADduction: AAROM;Left;10 reps;Supine Long Arc Quad: AROM;Both;10 reps;Seated      Assessment/Plan    PT Assessment Patient needs continued PT services  PT Diagnosis Difficulty walking;Abnormality of gait;Generalized weakness;Acute pain   PT Problem List Decreased strength;Decreased range of motion;Decreased activity tolerance;Decreased balance;Decreased mobility;Decreased knowledge of use of DME;Pain  PT Treatment Interventions DME instruction;Gait training;Functional mobility training;Therapeutic activities;Therapeutic exercise;Balance training;Neuromuscular re-education;Patient/family education;Manual techniques;Modalities   PT Goals (Current goals can be found in the Care Plan section) Acute Rehab PT Goals Patient Stated Goal: to get back to her baseline PT Goal Formulation: With patient Time For Goal Achievement: 09/20/14 Potential to Achieve Goals: Fair    Frequency Min 3X/week   Barriers to discharge Decreased caregiver support pt does not have 24/7 assist at discharge       End of Session Equipment Utilized During Treatment: Oxygen (applied before and after session.) Activity Tolerance: Patient limited by pain Patient left: in bed;with call bell/phone within reach;with family/visitor present Nurse Communication: Mobility status         Time: 1459-1531 PT Time Calculation (min) (ACUTE ONLY): 32 min   Charges:   PT Evaluation $Initial PT Evaluation Tier I: 1 Procedure PT Treatments $Therapeutic Activity: 8-22 mins        Gian Ybarra B. [place], [place], DPT [phone]   09/13/2014, 3:43 PM

## 2014-09-14 ENCOUNTER — Inpatient Hospital Stay (HOSPITAL_COMMUNITY): Payer: Medicare (Managed Care)

## 2014-09-14 DIAGNOSIS — M810 Age-related osteoporosis without current pathological fracture: Secondary | ICD-10-CM | POA: Diagnosis present

## 2014-09-14 DIAGNOSIS — N189 Chronic kidney disease, unspecified: Secondary | ICD-10-CM

## 2014-09-14 DIAGNOSIS — S22000A Wedge compression fracture of unspecified thoracic vertebra, initial encounter for closed fracture: Secondary | ICD-10-CM | POA: Diagnosis present

## 2014-09-14 DIAGNOSIS — I499 Cardiac arrhythmia, unspecified: Secondary | ICD-10-CM

## 2014-09-14 DIAGNOSIS — R509 Fever, unspecified: Secondary | ICD-10-CM

## 2014-09-14 DIAGNOSIS — S72002A Fracture of unspecified part of neck of left femur, initial encounter for closed fracture: Secondary | ICD-10-CM

## 2014-09-14 DIAGNOSIS — D62 Acute posthemorrhagic anemia: Secondary | ICD-10-CM | POA: Insufficient documentation

## 2014-09-14 DIAGNOSIS — D649 Anemia, unspecified: Secondary | ICD-10-CM

## 2014-09-14 DIAGNOSIS — R9389 Abnormal findings on diagnostic imaging of other specified body structures: Secondary | ICD-10-CM | POA: Insufficient documentation

## 2014-09-14 DIAGNOSIS — D539 Nutritional anemia, unspecified: Secondary | ICD-10-CM | POA: Insufficient documentation

## 2014-09-14 LAB — BASIC METABOLIC PANEL
ANION GAP: 5 (ref 5–15)
BUN: 21 mg/dL (ref 6–23)
CO2: 21 mmol/L (ref 19–32)
CREATININE: 1.58 mg/dL — AB (ref 0.50–1.10)
Calcium: 8.3 mg/dL — ABNORMAL LOW (ref 8.4–10.5)
Chloride: 113 mmol/L — ABNORMAL HIGH (ref 96–112)
GFR calc non Af Amer: 28 mL/min — ABNORMAL LOW (ref >90)
GFR, EST AFRICAN AMERICAN: 33 mL/min — AB (ref >90)
Glucose, Bld: 155 mg/dL — ABNORMAL HIGH (ref 70–99)
POTASSIUM: 4.8 mmol/L (ref 3.5–5.1)
Sodium: 139 mmol/L (ref 135–145)

## 2014-09-14 LAB — CBC WITH DIFFERENTIAL/PLATELET
BASOS ABS: 0 10*3/uL (ref 0.0–0.1)
Basophils Relative: 0 % (ref 0–1)
Eosinophils Absolute: 0.5 10*3/uL (ref 0.0–0.7)
Eosinophils Relative: 6 % — ABNORMAL HIGH (ref 0–5)
HCT: 22.1 % — ABNORMAL LOW (ref 36.0–46.0)
Hemoglobin: 7.1 g/dL — ABNORMAL LOW (ref 12.0–15.0)
LYMPHS ABS: 1.7 10*3/uL (ref 0.7–4.0)
LYMPHS PCT: 20 % (ref 12–46)
MCH: 34 pg (ref 26.0–34.0)
MCHC: 32.1 g/dL (ref 30.0–36.0)
MCV: 105.7 fL — AB (ref 78.0–100.0)
MONO ABS: 0.9 10*3/uL (ref 0.1–1.0)
Monocytes Relative: 11 % (ref 3–12)
NEUTROS ABS: 5.3 10*3/uL (ref 1.7–7.7)
Neutrophils Relative %: 63 % (ref 43–77)
Platelets: 120 10*3/uL — ABNORMAL LOW (ref 150–400)
RBC: 2.09 MIL/uL — AB (ref 3.87–5.11)
RDW: 11.8 % (ref 11.5–15.5)
WBC: 8.5 10*3/uL (ref 4.0–10.5)

## 2014-09-14 LAB — PREPARE RBC (CROSSMATCH)

## 2014-09-14 LAB — UIFE/LIGHT CHAINS/TP QN, 24-HR UR
Albumin, U: DETECTED
Alpha 1, Urine: DETECTED — AB
Alpha 2, Urine: DETECTED — AB
Beta, Urine: DETECTED — AB
GAMMA UR: DETECTED — AB
Total Protein, Urine: 139 mg/dL — ABNORMAL HIGH (ref 5–24)

## 2014-09-14 LAB — FOLATE RBC
Folate, Hemolysate: 490 ng/mL
Folate, RBC: 2076 ng/mL (ref >498)
HEMATOCRIT: 23.6 % — AB (ref 34.0–46.6)

## 2014-09-14 MED ORDER — IOHEXOL 300 MG/ML  SOLN
80.0000 mL | Freq: Once | INTRAMUSCULAR | Status: AC | PRN
  Administered 2014-09-14: 75 mL via INTRAVENOUS

## 2014-09-14 MED ORDER — LEVOFLOXACIN 500 MG PO TABS
250.0000 mg | ORAL_TABLET | Freq: Every day | ORAL | Status: DC
  Administered 2014-09-14 – 2014-09-15 (×2): 250 mg via ORAL
  Filled 2014-09-14 (×2): qty 1

## 2014-09-14 MED ORDER — SODIUM CHLORIDE 0.9 % IV SOLN
INTRAVENOUS | Status: AC
  Administered 2014-09-14: 09:00:00 via INTRAVENOUS

## 2014-09-14 MED ORDER — SODIUM CHLORIDE 0.9 % IV SOLN
Freq: Once | INTRAVENOUS | Status: DC
Start: 2014-09-14 — End: 2014-09-16

## 2014-09-14 NOTE — Clinical Social Work Placement (Addendum)
Clinical Social Work Department CLINICAL SOCIAL WORK PLACEMENT NOTE 09/14/2014  Patient:  Alexandra Booth, Alexandra Booth  Account Number:  [id number] Admit date:  09/11/2014  Clinical Social Worker:  Delrae Sawyers  Date/time:  09/14/2014 02:53 PM  Clinical Social Work is seeking post-discharge placement for this patient at the following level of care:   [place]   (*CSW will update this form in Epic as items are completed)   09/14/2014  Patient/family provided with [place] Department of Clinical Social Work's list of facilities offering this level of care within the geographic area requested by the patient (or if unable, by the patient's family).  09/14/2014  Patient/family informed of their freedom to choose among providers that offer the needed level of care, that participate in Medicare, Medicaid or managed care program needed by the patient, have an available bed and are willing to accept the patient.  09/14/2014  Patient/family informed of [facility]' ownership interest in [facility], as well as of the fact that they are under no obligation to receive care at this facility.  PASARR submitted to EDS on 09/14/2014 PASARR number received on 09/14/2014  FL2 transmitted to all facilities in geographic area requested by pt/family on  09/14/2014 FL2 transmitted to all facilities within larger geographic area on   Patient informed that his/her managed care company has contracts with or will negotiate with  certain facilities, including the following:     Patient/family informed of bed offers received:  09/14/2014 Patient chooses bed at [facility] Physician recommends and patient chooses bed at    Patient to be transferred to [facility] on  09/19/2014 Patient to be transferred to facility by [facility] Patient and family notified of transfer on 09/19/2014 Name of family member notified:  Patient notified.  The following  physician request were entered in Epic:   Additional Comments: [facility] patient.  Lubertha Sayres, [place] Cell: [phone]       Fax: [phone] Clinical Social Work: Orthopedics [phone]) and Surgical [phone])

## 2014-09-14 NOTE — Progress Notes (Signed)
Received a call back from [place], [place] granddaughter and have now received consent to admin the ordered unit of blood.

## 2014-09-14 NOTE — Progress Notes (Signed)
OT NOTE  DEFER SNF AT THIS TIME TOTAL (A) and Lift required  Pt is current D/C plan is SNF. No apparent immediate acute care OT needs, therefore will defer OT to SNF. If OT eval is needed please call Acute Rehab Dept. at [phone] or text page OT at [phone].   Jeri Modena   OTR/L Pager: [phone] Office: [phone] .

## 2014-09-14 NOTE — Progress Notes (Signed)
Acute blood loss anemia - would recommend transfusion of 1 unit of RBCs.  Will need 2 weeks total of lovenox.  Will need SNF placement.  Stable from ortho standpoint.  Azucena Cecil, MD [place] 11:01 AM

## 2014-09-14 NOTE — Clinical Social Work Psychosocial (Signed)
Clinical Social Work Department BRIEF PSYCHOSOCIAL ASSESSMENT 09/14/2014  Patient:  Alexandra Booth, Alexandra Booth     Account Number:  [id number]     Admit date:  09/11/2014  Clinical Social Worker:  Delrae Sawyers  Date/Time:  09/14/2014 02:40 PM  Referred by:  Physician  Date Referred:  09/14/2014 Referred for  SNF Placement   Other Referral:   none.   Interview type:  Patient Other interview type:   Raquel Sarna, [facility] social worker.    PSYCHOSOCIAL DATA Living Status:  FAMILY Admitted from facility:   Level of care:   Primary support name:  [place] Nurse Primary support relationship to patient:  FAMILY Degree of support available:   Patient's granddaughter. Per RN report, patient lives with patient's daughter.    CURRENT CONCERNS Current Concerns  Post-Acute Placement   Other Concerns:   none.    SOCIAL WORK ASSESSMENT / PLAN CSW received referral for possible SNF placement at time of discharge. CSW attempted to meet with patient at bedside regarding discharge disposition. Patient presented drowsy at bedside and "in and out" throughout conversation with CSW. Patient acknowledged PT recommendation for SNF placement and agreed to [facility] discharge plan of [facility].    CSW received call from [place], [place] worker, stating patient will discharge to [facility] once medically stable for discharge.    CSW to continue to follow and assist with discharge planning needs.   Assessment/plan status:  Psychosocial Support/Ongoing Assessment of Needs Other assessment/ plan:   none.   Information/referral to community resources:   [facility].  FL2 on patient's chart for MD signature.    PATIENT'S/FAMILY'S RESPONSE TO PLAN OF CARE: Patient expressed understanding and agreeable to CSW plan of care. Patient expressed no further questions or concerns at this time.       Lubertha Sayres, [place] Cell: [phone]       Fax: [phone] Clinical Social Work: Orthopedics  [phone]) and Surgical [phone])

## 2014-09-14 NOTE — Progress Notes (Signed)
Subjective:  [name] was seen and examined at bedside.  POD #2 s/p L femur reduction and nailing.  Febrile overnight, Tmax 101.68F, down to 99.40F this morning. Continues to have dysuria and reports chronic diffuse body pain, worse on left leg. She otherwise denies SOB or chest pain. Was drinking coffee. Reports feeling cold at night but better this morning.   Objective: Vital signs in last 24 hours: Filed Vitals:   09/13/14 2000 09/13/14 2052 09/14/14 0000 09/14/14 0529  BP:  114/45  109/72  Pulse:  97  84  Temp:  101.1 F (38.4 C)  99.9 F (37.7 C)  TempSrc:  Oral  Oral  Resp: _0 SpO2: 96% 97% 97% 96%   Weight change:   Intake/Output Summary (Last 24 hours) at 09/14/14 0802 Last data filed at 09/13/14 1100  Gross per 24 hour  Intake      0 ml  Output      0 ml  Net      0 ml   Physical Exam: General: lying in bed, NAD, warm Heart: Irregular, ?additonal beat, +SEM, soft, loudest RUSB Lungs: Anterior fields clear to auscultation  Abd: Soft, +bs, mild TTP left lower quadrant Extremities: tenderness to palpation of LLE knees and below and near hip, moving all extremities but limited ROM of left LE, +edema from thigh up to hip region Neuro: A & O x 3, following commands  Lab Results: Basic Metabolic Panel:  Recent Labs Lab 09/11/14 1735 09/12/14 0511 09/13/14 0715  NA  --  141 140  K  --  4.3 4.4  CL  --  111 114*  CO2  --  25 23  GLUCOSE  --  166* 151*  BUN  --  25* 17  CREATININE  --  1.77* 1.42*  CALCIUM  --  8.0* 7.7*  MG 1.5  --  2.0  PHOS 1.7*  --   --    Liver Function Tests:  Recent Labs Lab 09/11/14 1735  AST 26  ALT 22  ALKPHOS 51  BILITOT 0.5  PROT 5.2*  ALBUMIN 2.2*   CBC:  Recent Labs Lab 09/11/14 1130 09/12/14 0511 09/13/14 0715  WBC 9.3 7.0 7.6  NEUTROABS 8.1*  --   --   HGB 11.2* 9.3* 7.9*  HCT 34.2* 28.8* 24.7*  MCV 104.0* 105.5* 106.9*  PLT 187 171 134*   Cardiac Enzymes:  Recent Labs Lab  09/11/14 1130 09/12/14 0511  CKTOTAL 214* 619*  TROPONINI <0.03  --    CBG:  Recent Labs Lab 09/12/14 1242 09/12/14 1616 09/12/14 1849 09/12/14 2141 09/13/14 1208 09/13/14 1648  GLUCAP 126* 123* 143* 142* 201* 179*   Hemoglobin A1C:  Recent Labs Lab 09/11/14 1735  HGBA1C 5.6   Thyroid Function Tests:  Recent Labs Lab 09/11/14 1735  TSH 0.406   Coagulation:  Recent Labs Lab 09/11/14 1130  LABPROT 14.9  INR 1.16   Anemia Panel:  Recent Labs Lab 09/11/14 1735  VITAMINB12 1934*  FOLATE >20.0  FERRITIN 90  TIBC 285  IRON 33*  RETICCTPCT 2.3   Urinalysis:  Recent Labs Lab 09/11/14 1147  COLORURINE ORANGE*  LABSPEC 1.020  PHURINE 5.0  GLUCOSEU NEGATIVE  HGBUR MODERATE*  BILIRUBINUR NEGATIVE  KETONESUR NEGATIVE  PROTEINUR 100*  UROBILINOGEN 1.0  NITRITE POSITIVE*  LEUKOCYTESUR LARGE*   Micro Results: Recent Results (from the past 240 hour(s))  Surgical pcr screen     Status: None   Collection Time: 09/12/14  8:26 AM  Result Value Ref Range Status   MRSA, PCR NEGATIVE NEGATIVE Final   Staphylococcus aureus NEGATIVE NEGATIVE Final    Comment:        The Xpert SA Assay (FDA approved for NASAL specimens in patients over 99 years of age), is one component of a comprehensive surveillance program.  Test performance has been validated by [facility] for patients greater than or equal to 40 year old. It is not intended to diagnose infection nor to guide or monitor treatment.   Urine culture     Status: None   Collection Time: 09/12/14  1:40 PM  Result Value Ref Range Status   Specimen Description URINE, RANDOM  Final   Special Requests NONE  Final   Colony Count NO GROWTH Performed at [facility]   Final   Culture NO GROWTH Performed at [facility]   Final   Report Status 09/13/2014 FINAL  Final   Studies/Results: Ct Chest W Contrast  09/14/2014   CLINICAL DATA:  Left-sided chest pain. Right upper lobe opacity by  plain film of the chest.  EXAM: CT CHEST WITH CONTRAST  TECHNIQUE: Multidetector CT imaging of the chest was performed during intravenous contrast administration.  CONTRAST:  75 mL OMNIPAQUE IOHEXOL 300 MG/ML  SOLN  COMPARISON:  Single view of the chest 09/11/2014. CT chest 06/23/2008.  FINDINGS: Trace bilateral pleural effusions are seen. There is no pericardial effusion. Heart size is upper normal. Small hiatal hernia is unchanged in appearance. Calcific aortic and coronary atherosclerosis is noted. There is no axillary, hilar or mediastinal lymphadenopathy.  No pulmonary nodule or mass is seen in the right upper lobe or elsewhere in the chest. Opacity seen on prior chest film is likely due to vascular structures. There is no evidence of neoplastic process. The lungs demonstrate only mild dependent atelectasis.  Incidentally imaged upper abdomen is unremarkable. Bones demonstrate a mild inferior endplate compression fracture of T9 with vertebral body height loss with approximately 20%. The fracture is age indeterminate but new since the prior PA and lateral chest.  IMPRESSION: Negative for evidence of neoplasm. Right apical opacity seen on prior chest film is due to vascular structures.  Trace bilateral pleural effusions.  Calcific aortic and coronary atherosclerosis.  Small hiatal hernia.  Mild T9 inferior endplate compression fracture is age indeterminate but new since 2014.   Electronically Signed   By: Inge Rise M.D.   On: 09/14/2014 07:57   Dg C-arm 1-60 Min  09/12/2014   CLINICAL DATA:  Femur fracture fixation.  EXAM: LEFT FEMUR 2 VIEWS; DG C-ARM 61-120 MIN  COMPARISON:  09/11/2014  FINDINGS: Multiple fluoroscopic spot images demonstrate placement of an intramedullary rod in the left femur traversing the subtrochanteric fracture with 2 proximal and 2 distal locking screws.  IMPRESSION: Internal fixation of subtrochanteric femur fracture.   Electronically Signed   By: Marijo Sanes M.D.   On:  09/12/2014 18:39   Dg Femur Min 2 Views Left  09/12/2014   CLINICAL DATA:  Femur fracture fixation.  EXAM: LEFT FEMUR 2 VIEWS; DG C-ARM 61-120 MIN  COMPARISON:  09/11/2014  FINDINGS: Multiple fluoroscopic spot images demonstrate placement of an intramedullary rod in the left femur traversing the subtrochanteric fracture with 2 proximal and 2 distal locking screws.  IMPRESSION: Internal fixation of subtrochanteric femur fracture.   Electronically Signed   By: Marijo Sanes M.D.   On: 09/12/2014 18:39   Medications: I have reviewed the patient's current medications. Scheduled  Meds: . calcium carbonate  1 tablet Oral Q breakfast  . cefTRIAXone (ROCEPHIN)  IV  1 g Intravenous Q24H  . cholecalciferol  1,000 Units Oral Daily  . darifenacin  7.5 mg Oral Daily  . enoxaparin (LOVENOX) injection  40 mg Subcutaneous Q24H  . escitalopram  20 mg Oral Daily  . feeding supplement (ENSURE ENLIVE)  237 mL Oral BID BM  . levothyroxine  25 mcg Oral QAC breakfast  . mirtazapine  15 mg Oral Daily  . multivitamin with minerals  1 tablet Oral Daily  . pantoprazole  40 mg Oral Daily   Continuous Infusions:   PRN Meds:.acetaminophen **OR** acetaminophen, albuterol, ALPRAZolam, alum & mag hydroxide-simeth, docusate sodium, menthol-cetylpyridinium **OR** phenol, methocarbamol **OR** methocarbamol (ROBAXIN)  IV, metoprolol, morphine injection, ondansetron **OR** ondansetron (ZOFRAN) IV, oxyCODONE, zolpidem Assessment/Plan: Principal Problem:   Fracture, proximal femur Active Problems:   Hypothyroidism   T2DM (type 2 diabetes mellitus)   Anemia   HYPERTENSION   Fall   UTI (lower urinary tract infection)   Chronic kidney disease (CKD), stage III (moderate)   Femur fracture, left   Opacity of lung on imaging study   Obesity   Fever  Acute Nondisplaced Spiral Left Femoral Diaphysis Fracture s/p closed reduction and intramedullary nailing on 09/12/14.  No prior hx of osteoporosis but noted to have new T9 inferior  endplate compression fracture since 2014, age indeterminate on CT chest.  -Appreciate ortho following -Touch down weight bearing LLE and PT recommended SNF -Pain control with IV Morphine prn and PO oxycodone prn, transition to PO pain medication as tolerated -Robaxin PRN muscle spasms  -Work-up of secondary osteoporosis pending: SPEP with IFE, light chains, and UPEP with IFE--Urine Total protein 139  -Continue oscal and vitamin D -Given compression fracture on imaging, more suggestive of possible underlying osteoporosis. Could consider bisphosphonate therapy and/or DEXA scan as outpatient--will defer to PCP -SW consult for falls at home and SNF placement -Prealbumin 16 -Lovenox for DVT prophylaxis, will need to clarify for duration with orthopedics   Fever--Tmax 101.24F overnight, improved to 99.66F this morning. No evidence of PNA seen on CT chest today. She does have UTI, on abx x4 days with no growth on urine culture.  Continues to report dysuria and has baseline chronic incontinence. She is s/p surgery that could be possible etiology, however, has been on lovenox since surgery for DVT Prophylaxis. -will continue to monitor temp, if spikes temp again, would re-culture including blood.   -repeat urine cx  Complicated UTI--continues to have dysuria, however febrile overnight. -On IV ceftriaxone 1 g Day 4/7--given persistent symptoms and fever overnight, may change to FQ today (renal dosing) -Urine cx no growth, will try to repeat today -Continue home darifenacin 7.5 mg daily for chronic incontinence  -Hold home pyridium 200 mg PRN pain -Bladder scan  Irregular HR--no PMH listed in chart. Sinus rhythm on initial EKG. ?Afib vs. PACs -repeat EKG  Non-oliguric AKI on CKD --Trending down, presumed baseline per primary team likely ~Cr 1.5. Thought to be secondary to prerenal azotemia in setting of volume depletion with concomitant ARB use in setting of probable CKD, however, calculated FeNa  1.2% suggesting intrinsic renal etiology on admission.  -IVF's decreased yesterday -Daily weights and I/O's--?accuracy in setting of incontinence, noted to have 740m output yesterday with net +2.7L since admission  -BMET pending today -Avoid nephrotoxins, holding home losartan 75 mg daily--may be able to restart if renal function improved and remains at baseline -She did get contrast again this  morning, will restart IVF this morning  Hypothyroidism - Pt at home on synthroid 25 mcg daily. TSH level on 09/11/14 normal. Pt is s/p thyroidectomy.  -Continue home synthroid 25 mcg daily   Hypertension - Currently normotensive. Pt at home on losartan.  -Hold home losartan 75 mg daily in setting of AKI -IV lopressor 2.5 mg PRN SBP>180  Chronic Macrocytic Anemia - Hb down to 7.9 4/5 below baseline 11 in setting of acute blood loss from surgery. 75cc noted on output.Home b12 supplements discontinued this admission. Anemia panel with elevated B12 (1934).  Possible in setting of hypothyroidism (however controlled) vs multiple myeloma vs myelodysplasia (however other counts stable). -Folate pending -Holding home aspirin 81 mg daily for now in setting of lovenox therapy  -FOBT pending  Medial Right Upper lobe Opacity--seen initially on chest xray on admission, but noted to be likely secondary to vascular structure on CT chest with contrast done this morning.  Negative for pulmonary nodule or mass or evidence of neoplasm, trace b/l effusions with mild dependent atelectasis.  Pt is non-smoker but has had recent cough, dyspnea, and wheezing.  -Albuterol nebulizer Q 6 hr PRN wheezing/dyspnea   Depression and Anxiety - Currently with stable mood.  -Continue ambien, lexapro, remeron -Xanax 0.49m tid prn  Non-insulin dependent Type 2 DM - A1c 5.6 on diet control.  -Continue to monitor blood sugars -Consider SSI if continues to be elevated  Mild CAD - Pt with intermittent chest pain recently worked up  in January thought to be non-cardiac in nature. Pt at home on aspirin and losartan. Last echo 2006--EF 55-65%, mildly calcified aortic valve, trivial anterior pericardial effusion vs. epicardial fat. Noted to have calcific aortic and coronary atherosclerosis on CT chest.  -Holding home ASA s/p surgery and in setting of lovenox therapy for now -Holding ARB in setting of AKI   -Unclear why pt is not on BB and statin at home   GERD - Currently with no acid reflux symptoms -Continue home protonix 40 mg daily  Diet: Regular DVT Ppx: Lovenox  Code: Full  Dispo: Disposition is deferred at this time, awaiting improvement of current medical problems.  Anticipated discharge in approximately 2-4 day(s).   The patient does have a current PCP (Janifer Adie MD) and [facility].  The patient does have transportation limitations that hinder transportation to clinic appointments.  Services Needed at time of discharge: Y = Yes, Blank = No PT: SNF  OT:   RN:   Equipment:   Other:     LOS: 3 days   SWilber Oliphant MD 09/14/2014, 8:02 AM

## 2014-09-14 NOTE — Progress Notes (Signed)
Subjective: Patient reports improved pain control this morning, endorsing non-specific chest pain and diffuse abdominal pain similar to her baseline. Denies left-sided leg pain. Reports she had experienced burning with urination yesterday, but denies current urinary symptoms.  Appears less confused than on previous exam.   Objective: Vital signs in last 24 hours: Filed Vitals:   09/13/14 2052 09/14/14 0000 09/14/14 0529 09/14/14 0941  BP: 114/45  109/72   Pulse: 97  84   Temp: 101.1 F (38.4 C)  99.9 F (37.7 C) 99.7 F (37.6 C)  TempSrc: Oral  Oral Oral  Resp: '17 17 16   ' SpO2: 97% 97% 96%    Weight change:   Intake/Output Summary (Last 24 hours) at 09/14/14 1026 Last data filed at 09/14/14 0944  Gross per 24 hour  Intake     60 ml  Output      0 ml  Net     60 ml   General: resting in bed, no acute distress HEENT: PERRL, EOMI, no scleral icterus Cardiac: irregular rhythm, no rubs, murmurs or gallops Pulm: clear to auscultation bilaterally, moving normal volumes of air Abd: soft, nontender, nondistended, BS present Ext: warm and well perfused, trace pedal edema Neuro: alert and oriented X3, cranial nerves II-XII grossly intact  Lab Results: Basic Metabolic Panel:  Recent Labs  09/11/14 1735 09/12/14 0511 09/13/14 0715  NA  --  141 140  K  --  4.3 4.4  CL  --  111 114*  CO2  --  25 23  GLUCOSE  --  166* 151*  BUN  --  25* 17  CREATININE  --  1.77* 1.42*  CALCIUM  --  8.0* 7.7*  MG 1.5  --  2.0  PHOS 1.7*  --   --    Liver Function Tests:  Recent Labs  09/11/14 1735  AST 26  ALT 22  ALKPHOS 51  BILITOT 0.5  PROT 5.2*  ALBUMIN 2.2*   CBC:  Recent Labs  09/11/14 1130 09/12/14 0511 09/13/14 0715  WBC 9.3 7.0 7.6  NEUTROABS 8.1*  --   --   HGB 11.2* 9.3* 7.9*  HCT 34.2* 28.8* 24.7*  MCV 104.0* 105.5* 106.9*  PLT 187 171 134*   Cardiac Enzymes:  Recent Labs  09/11/14 1130 09/12/14 0511  CKTOTAL 214* 619*  TROPONINI <0.03  --     CBG:  Recent Labs  09/12/14 1242 09/12/14 1616 09/12/14 1849 09/12/14 2141 09/13/14 1208 09/13/14 1648  GLUCAP 126* 123* 143* 142* 201* 179*   Hemoglobin A1C:  Recent Labs  09/11/14 1735  HGBA1C 5.6   Thyroid Function Tests:  Recent Labs  09/11/14 1735  TSH 0.406   Anemia Panel:  Recent Labs  09/11/14 1735  VITAMINB12 1934*  FOLATE >20.0  FERRITIN 90  TIBC 285  IRON 33*  RETICCTPCT 2.3   Coagulation:  Recent Labs  09/11/14 1130  LABPROT 14.9  INR 1.16   Urinalysis:  Recent Labs  09/11/14 1147  COLORURINE ORANGE*  LABSPEC 1.020  PHURINE 5.0  GLUCOSEU NEGATIVE  HGBUR MODERATE*  BILIRUBINUR NEGATIVE  KETONESUR NEGATIVE  PROTEINUR 100*  UROBILINOGEN 1.0  NITRITE POSITIVE*  LEUKOCYTESUR LARGE*    Micro Results: Recent Results (from the past 240 hour(s))  Surgical pcr screen     Status: None   Collection Time: 09/12/14  8:26 AM  Result Value Ref Range Status   MRSA, PCR NEGATIVE NEGATIVE Final   Staphylococcus aureus NEGATIVE NEGATIVE Final    Comment:  The Xpert SA Assay (FDA approved for NASAL specimens in patients over 70 years of age), is one component of a comprehensive surveillance program.  Test performance has been validated by [facility] for patients greater than or equal to 47 year old. It is not intended to diagnose infection nor to guide or monitor treatment.   Urine culture     Status: None   Collection Time: 09/12/14  1:40 PM  Result Value Ref Range Status   Specimen Description URINE, RANDOM  Final   Special Requests NONE  Final   Colony Count NO GROWTH Performed at [facility]   Final   Culture NO GROWTH Performed at [facility]   Final   Report Status 09/13/2014 FINAL  Final   Studies/Results: Ct Chest W Contrast  09/14/2014   CLINICAL DATA:  Left-sided chest pain. Right upper lobe opacity by plain film of the chest.  EXAM: CT CHEST WITH CONTRAST  TECHNIQUE: Multidetector CT  imaging of the chest was performed during intravenous contrast administration.  CONTRAST:  75 mL OMNIPAQUE IOHEXOL 300 MG/ML  SOLN  COMPARISON:  Single view of the chest 09/11/2014. CT chest 06/23/2008.  FINDINGS: Trace bilateral pleural effusions are seen. There is no pericardial effusion. Heart size is upper normal. Small hiatal hernia is unchanged in appearance. Calcific aortic and coronary atherosclerosis is noted. There is no axillary, hilar or mediastinal lymphadenopathy.  No pulmonary nodule or mass is seen in the right upper lobe or elsewhere in the chest. Opacity seen on prior chest film is likely due to vascular structures. There is no evidence of neoplastic process. The lungs demonstrate only mild dependent atelectasis.  Incidentally imaged upper abdomen is unremarkable. Bones demonstrate a mild inferior endplate compression fracture of T9 with vertebral body height loss with approximately 20%. The fracture is age indeterminate but new since the prior PA and lateral chest.  IMPRESSION: Negative for evidence of neoplasm. Right apical opacity seen on prior chest film is due to vascular structures.  Trace bilateral pleural effusions.  Calcific aortic and coronary atherosclerosis.  Small hiatal hernia.  Mild T9 inferior endplate compression fracture is age indeterminate but new since 2014.   Electronically Signed   By: Inge Rise M.D.   On: 09/14/2014 07:57   Dg C-arm 1-60 Min  09/12/2014   CLINICAL DATA:  Femur fracture fixation.  EXAM: LEFT FEMUR 2 VIEWS; DG C-ARM 61-120 MIN  COMPARISON:  09/11/2014  FINDINGS: Multiple fluoroscopic spot images demonstrate placement of an intramedullary rod in the left femur traversing the subtrochanteric fracture with 2 proximal and 2 distal locking screws.  IMPRESSION: Internal fixation of subtrochanteric femur fracture.   Electronically Signed   By: Marijo Sanes M.D.   On: 09/12/2014 18:39   Dg Femur Min 2 Views Left  09/12/2014   CLINICAL DATA:  Femur fracture  fixation.  EXAM: LEFT FEMUR 2 VIEWS; DG C-ARM 61-120 MIN  COMPARISON:  09/11/2014  FINDINGS: Multiple fluoroscopic spot images demonstrate placement of an intramedullary rod in the left femur traversing the subtrochanteric fracture with 2 proximal and 2 distal locking screws.  IMPRESSION: Internal fixation of subtrochanteric femur fracture.   Electronically Signed   By: Marijo Sanes M.D.   On: 09/12/2014 18:39   Medications: I have reviewed the patient's current medications. Scheduled Meds: . calcium carbonate  1 tablet Oral Q breakfast  . cefTRIAXone (ROCEPHIN)  IV  1 g Intravenous Q24H  . cholecalciferol  1,000 Units Oral Daily  . darifenacin  7.5 mg Oral Daily  . enoxaparin (LOVENOX) injection  40 mg Subcutaneous Q24H  . escitalopram  20 mg Oral Daily  . feeding supplement (ENSURE ENLIVE)  237 mL Oral BID BM  . levothyroxine  25 mcg Oral QAC breakfast  . mirtazapine  15 mg Oral Daily  . multivitamin with minerals  1 tablet Oral Daily  . pantoprazole  40 mg Oral Daily   Continuous Infusions: . sodium chloride     PRN Meds:.acetaminophen **OR** acetaminophen, albuterol, ALPRAZolam, alum & mag hydroxide-simeth, docusate sodium, menthol-cetylpyridinium **OR** phenol, methocarbamol **OR** methocarbamol (ROBAXIN)  IV, metoprolol, morphine injection, ondansetron **OR** ondansetron (ZOFRAN) IV, oxyCODONE, zolpidem Assessment/Plan: Principal Problem:   Fracture, proximal femur Active Problems:   Hypothyroidism   T2DM (type 2 diabetes mellitus)   Anemia   HYPERTENSION   Fall   UTI (lower urinary tract infection)   Chronic kidney disease (CKD), stage III (moderate)   Femur fracture, left   Opacity of lung on imaging study   Obesity   Fever   Compression fracture of body of thoracic vertebra   Abnormal chest x-ray   Acute blood loss anemia   Macrocytic anemia   Osteoporosis  Proximal femur fracture s/p closed reduction and IM nailing on 09/12/14 2/2 osteoporosis: Patient reports  improved pain, now controlled with IV morphine and oxycodone IR. Mild medial compartment osteoarthritis on knee Xray on 4/3. CT chest on 4/6 demonstrate a mild inferior endplate compression fracture of T9 with vertebral body height loss with approximately 20%, not appreciated on imaging from 08/2012. PT eval on 4/5 reports patient unable to tolerate OOB activity and recommended SNF placement for further rehabilitation. OT deferred therapy to SNF. Will require 2 weeks lovenox post-op.   Orthopedics requested laboratory tests as below for work-up of fragility fracture:  CBC - abnl, continued anemia (as noted below),  ESR - 38 (high) serum calcium - 8.0, corrected to 9.4 with albumin 2.2 Creatinine - 1.77 (from 1.93) Albumin - 2.2 (low) Phosphate - 1.7 (low) alkaline phosphatase - nl  liver transaminases - nl protein electrophoresis - pending Urinalysis - +nitrite/LE 25-hydroxyvitamin D - 31 (nl) - morphine 30m/ml IV for severe pain, q4 PRN, will try and transition to PO pain medications. - oxycodone IR [id number] PRN - consider bisphosphonate tx by PCP post-d/c   Fall: Patient reports falling after standing on 09/10/2014 PM. Cannot recall if she lost consciousness. No acute compression fracture or wedge-shaped deformity of lumbar spine. No acute intracranial finding on head CT. Bones are subjectively osteopenic. Reports frequent falls at home, always after standing from seated position. Says she uses a wheelchair at home. Fall is likely 2/2 orthostatic hypotension vs weakness 2/2 deconditioning. Cannot assess orthostatic VS at this time given sustained injuries. Patient was down for at least 8 hours before being found by home aide. Added calcium supplementation. Vitamin D low normal (31) on 4/3. - social work c/s for possible SNF placement - Adding calcium  supplementation  UTI: Patient febrile to 101.1 on 4/5. Complains of dysuria on 4/6 as well as LLQ tenderness concerning for ongoing UTI vs less likely diverticulitis in setting of febrile episode. Denies chills. WBC wnl. UA from 4/3 significant for nitrite/LE+ with moderate blood, WBC too numerous to count and many bacteria. Patient endorses some back pain but is likely 2/2 to fall rather pyelonephritis. Symptoms had been resolving on 4/5 but complained of dysuria on 4/6. Urine cx frm 4/3 showed no growth. Day 4 of abx, continue for total of  7 day course - d/c ceftiaxone 1g IV q24hrs - levofloxacin 211m qday x 4days - in-and-out cath for UA and repeat culture  AKI: Creatinine 1.93, above baseline of 0.93, resolving 1.42 on 4/5. Likely prerenal etiology in setting of dehydration. Less likely rhabdomyolysis given elevated but <1000 CK (214 --> 619 on 4/4). No symptoms of fluid overload, will continue to monitor.  - NS '@75cc' /hr x 6hours - avoid NSAIDs  Acute blood loss superimposed on chronic macrocytic anemia: Hgb 11.2 (at baseline) down to 7.9 (from 9.3 on 4/4) and again down on 4/6 to 7.1 likely 2/2 acute blood loss intraoperatively (EBL 2072m. Patient reports one episode of dizziness overnight. MCV 104, possibly 2/2 malnutrition. Anemia panel demonstrated low iron (33), low saturation (12%), normal TIBC, ferritin normal (90). Elevated B12 (1900) and normal folate (>20). In setting of normal B12, folate, may consider nonmegaloblastic macrocytosis. Smear demonstrated unremarkable morphology. Macrocytosis can be seen with chronic alcohol use in absence of folate deficiency, though patient denies alcohol use. Furthermore, no evidence of liver disease. Mild macrocytosis, as observed in this patient, can occur in aplastic anemia, although unlikely in setting of normal values in other cell lines. Alternative diagnoses include myelodysplasia, which would require a bone marrow biopsy. - f/u RBC folate  level - AM CBC - transfuse 1u RBC for hgb 7.1   Opacity of lung on imaging: New asymmetric opacity in medial right upper lobe. Differential diagnosis includes pulmonary neoplasm, contusion, or infiltrate. Contusion is less likely given that patient fell on left side and is unlikely to have sustained serious injury to right chest field. Pulmonary neoplasm may be higher on differential in setting of h/o tobacco use. Patient not complaining of infectious symptoms of cough, is afebrile, so infectious infiltrate is less likely. Contrast CT on 4/6 Negative for evidence of neoplasm. Right apical opacity seen on prior chest film is due to vascular structures.   - no further w/u required  Irregular Heart rhythm: irregular beats with missed beats appreciated on exam on 4/6. EKG significant for left axis deviation and t-wave abnormality. Endorses chest pain but present at baseline and PCP-reported pain as likely non-cardiac in nature on evaluation in Jan 2016. - telemetry.   T2DM: Glucose elevated to 175 on admission. Has history of diet-controlled blood glucose. Patient not currently on any oral medications for glucose control. A1C 5.6% - continue diet-controlled management  HTN: managed on losartan. - hold home losartan in setting of AKI - metoprolol [id number] for SBP>180  Hypothyroidism: History of thyroidectomy. Patient receives synthroid at home. TSH 0.406 (wnl) - restart home synthroid, 12.5 mcg IV (receives 227m58mO)  DVT Ppx: Lovenox Code: Full Diet: full Dispo: Disposition is deferred at this time, awaiting improvement of current medical problems. Likely will require SNF placement. Anticipated discharge in approximately 2 day(s).   The patient does have a current PCP (RobJanifer Adie) and [facility].  The patient does not have transportation limitations that hinder transportation to clinic appointments.  .Services Needed at time  of discharge: Y = Yes, Blank = No PT:   OT:   RN:   Equipment:   Other:     LOS: 3 days   SaraCarolan Shiverd Student 09/14/2014, 10:26 AM

## 2014-09-14 NOTE — Progress Notes (Signed)
Attempted to obtain blood consent from Pt but due to Pts confusion and narcotic admin, I do not feel comfortable having her sign a consent at this time. I left a message for Marcella, Pts grandaughter and emergency contact. Will attempt again to reach [facility] prior to shift change.

## 2014-09-15 ENCOUNTER — Inpatient Hospital Stay (HOSPITAL_COMMUNITY): Payer: Medicare (Managed Care)

## 2014-09-15 DIAGNOSIS — I129 Hypertensive chronic kidney disease with stage 1 through stage 4 chronic kidney disease, or unspecified chronic kidney disease: Secondary | ICD-10-CM

## 2014-09-15 DIAGNOSIS — N183 Chronic kidney disease, stage 3 (moderate): Secondary | ICD-10-CM

## 2014-09-15 LAB — BASIC METABOLIC PANEL
Anion gap: 4 — ABNORMAL LOW (ref 5–15)
BUN: 25 mg/dL — ABNORMAL HIGH (ref 6–23)
CO2: 24 mmol/L (ref 19–32)
CREATININE: 1.66 mg/dL — AB (ref 0.50–1.10)
Calcium: 8.4 mg/dL (ref 8.4–10.5)
Chloride: 111 mmol/L (ref 96–112)
GFR, EST AFRICAN AMERICAN: 31 mL/min — AB (ref >90)
GFR, EST NON AFRICAN AMERICAN: 26 mL/min — AB (ref >90)
Glucose, Bld: 132 mg/dL — ABNORMAL HIGH (ref 70–99)
Potassium: 4.7 mmol/L (ref 3.5–5.1)
Sodium: 139 mmol/L (ref 135–145)

## 2014-09-15 LAB — PROTEIN ELECTROPHORESIS, SERUM
A/G Ratio: 0.9 (ref 0.7–2.0)
ALBUMIN ELP: 2.3 g/dL — AB (ref 3.2–5.6)
Alpha-1-Globulin: 0.3 g/dL (ref 0.1–0.4)
Alpha-2-Globulin: 0.5 g/dL (ref 0.4–1.2)
Beta Globulin: 0.9 g/dL (ref 0.6–1.3)
GAMMA GLOBULIN: 0.8 g/dL (ref 0.5–1.6)
Globulin, Total: 2.6 g/dL (ref 2.0–4.5)
TOTAL PROTEIN ELP: 4.9 g/dL — AB (ref 6.0–8.5)

## 2014-09-15 LAB — URINE CULTURE
CULTURE: NO GROWTH
Colony Count: NO GROWTH

## 2014-09-15 LAB — TYPE AND SCREEN
ABO/RH(D): O POS
Antibody Screen: NEGATIVE
Unit division: 0

## 2014-09-15 LAB — CBC
HCT: 26 % — ABNORMAL LOW (ref 36.0–46.0)
Hemoglobin: 8.6 g/dL — ABNORMAL LOW (ref 12.0–15.0)
MCH: 33.5 pg (ref 26.0–34.0)
MCHC: 33.1 g/dL (ref 30.0–36.0)
MCV: 101.2 fL — ABNORMAL HIGH (ref 78.0–100.0)
PLATELETS: 123 10*3/uL — AB (ref 150–400)
RBC: 2.57 MIL/uL — AB (ref 3.87–5.11)
RDW: 15.3 % (ref 11.5–15.5)
WBC: 10.7 10*3/uL — ABNORMAL HIGH (ref 4.0–10.5)

## 2014-09-15 MED ORDER — ASPIRIN EC 325 MG PO TBEC
325.0000 mg | DELAYED_RELEASE_TABLET | Freq: Two times a day (BID) | ORAL | Status: DC
  Administered 2014-09-15 – 2014-09-19 (×9): 325 mg via ORAL
  Filled 2014-09-15 (×9): qty 1

## 2014-09-15 MED ORDER — SODIUM CHLORIDE 0.9 % IV SOLN
INTRAVENOUS | Status: AC
  Administered 2014-09-15: 14:00:00 via INTRAVENOUS

## 2014-09-15 MED ORDER — POLYETHYLENE GLYCOL 3350 17 G PO PACK
17.0000 g | PACK | Freq: Every day | ORAL | Status: DC
  Administered 2014-09-16 – 2014-09-19 (×4): 17 g via ORAL
  Filled 2014-09-15 (×4): qty 1
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
## 2014-09-15 NOTE — Progress Notes (Signed)
Patient's thigh is soft, dressings are c/d/i.  Stable from ortho standpoint.  If she responds appropriately to the transfusion, may dc to SNF.  Will switch to aspirin and dc lovenox for continued decrease in hgb.  Azucena Cecil, MD [place] 8:00 AM

## 2014-09-15 NOTE — Progress Notes (Signed)
Subjective:  Pt seen and examined in AM. Pt with low grade fever overnight 100.8. She reports diffuse abdominal pain but denies nausea or vomiting. She continues to have dysuria.          Objective: Vital signs in last 24 hours: Filed Vitals:   09/15/14 0132 09/15/14 0400 09/15/14 0500 09/15/14 0539  BP: 126/41   135/41  Pulse: 79   82  Temp: 100.8 F (38.2 C)   97.5 F (36.4 C)  TempSrc: Oral   Axillary  Resp: '16 12  14  ' Height:      Weight:   215 lb (97.523 kg)   SpO2: 97% 98%  100%   Weight change:   Intake/Output Summary (Last 24 hours) at 09/15/14 0726 Last data filed at 09/15/14 0132  Gross per 24 hour  Intake  897.5 ml  Output      0 ml  Net  897.5 ml    Physical Exam: General: NAD Heart: Normal rate and rhythm  Lungs: Anterior fields clear to auscultation  Abdomen: Diffuse abdominal tenderness with normal BS.  Extremities: Left femoral tenderness with palpation. Limited ROM of left LE. Neuro: A & O x 3  Lab Results: Basic Metabolic Panel:  Recent Labs Lab 09/11/14 1735  09/13/14 0715 09/14/14 0950  NA  --   < > 140 139  K  --   < > 4.4 4.8  CL  --   < > 114* 113*  CO2  --   < > 23 21  GLUCOSE  --   < > 151* 155*  BUN  --   < > 17 21  CREATININE  --   < > 1.42* 1.58*  CALCIUM  --   < > 7.7* 8.3*  MG 1.5  --  2.0  --   PHOS 1.7*  --   --   --   < > = values in this interval not displayed. Liver Function Tests:  Recent Labs Lab 09/11/14 1735  AST 26  ALT 22  ALKPHOS 51  BILITOT 0.5  PROT 5.2*  ALBUMIN 2.2*   No results for input(s): LIPASE, AMYLASE in the last 168 hours. No results for input(s): AMMONIA in the last 168 hours. CBC:  Recent Labs Lab 09/11/14 1130  09/13/14 0715 09/14/14 0950  WBC 9.3  < > 7.6 8.5  NEUTROABS 8.1*  --   --  5.3  HGB 11.2*  < > 7.9* 7.1*  HCT 34.2*  < > 24.7*  23.6* 22.1*  MCV 104.0*  < > 106.9* 105.7*  PLT 187  < > 134* 120*  < > = values in this interval not displayed. Cardiac  Enzymes:  Recent Labs Lab 09/11/14 1130 09/12/14 0511  CKTOTAL 214* 619*  TROPONINI <0.03  --    BNP: No results for input(s): PROBNP in the last 168 hours. D-Dimer: No results for input(s): DDIMER in the last 168 hours. CBG:  Recent Labs Lab 09/12/14 1242 09/12/14 1616 09/12/14 1849 09/12/14 2141 09/13/14 1208 09/13/14 1648  GLUCAP 126* 123* 143* 142* 201* 179*   Hemoglobin A1C:  Recent Labs Lab 09/11/14 1735  HGBA1C 5.6   Fasting Lipid Panel: No results for input(s): CHOL, HDL, LDLCALC, TRIG, CHOLHDL, LDLDIRECT in the last 168 hours. Thyroid Function Tests:  Recent Labs Lab 09/11/14 1735  TSH 0.406   Coagulation:  Recent Labs Lab 09/11/14 1130  LABPROT 14.9  INR 1.16   Anemia Panel:  Recent Labs Lab 09/11/14 1735  VITAMINB12 1934*  FOLATE >20.0  FERRITIN 90  TIBC 285  IRON 33*  RETICCTPCT 2.3   Urine Drug Screen: Drugs of Abuse  No results found for: LABOPIA, COCAINSCRNUR, LABBENZ, AMPHETMU, THCU, LABBARB  Alcohol Level: No results for input(s): ETH in the last 168 hours. Urinalysis:  Recent Labs Lab 09/11/14 1147  COLORURINE ORANGE*  LABSPEC 1.020  PHURINE 5.0  GLUCOSEU NEGATIVE  HGBUR MODERATE*  BILIRUBINUR NEGATIVE  KETONESUR NEGATIVE  PROTEINUR 100*  UROBILINOGEN 1.0  NITRITE POSITIVE*  LEUKOCYTESUR LARGE*    Micro Results: Recent Results (from the past 240 hour(s))  Surgical pcr screen     Status: None   Collection Time: 09/12/14  8:26 AM  Result Value Ref Range Status   MRSA, PCR NEGATIVE NEGATIVE Final   Staphylococcus aureus NEGATIVE NEGATIVE Final    Comment:        The Xpert SA Assay (FDA approved for NASAL specimens in patients over 17 years of age), is one component of a comprehensive surveillance program.  Test performance has been validated by [facility] for patients greater than or equal to 86 year old. It is not intended to diagnose infection nor to guide or monitor treatment.   Urine culture      Status: None   Collection Time: 09/12/14  1:40 PM  Result Value Ref Range Status   Specimen Description URINE, RANDOM  Final   Special Requests NONE  Final   Colony Count NO GROWTH Performed at [facility]   Final   Culture NO GROWTH Performed at [facility]   Final   Report Status 09/13/2014 FINAL  Final   Studies/Results: Ct Chest W Contrast  09/14/2014   CLINICAL DATA:  Left-sided chest pain. Right upper lobe opacity by plain film of the chest.  EXAM: CT CHEST WITH CONTRAST  TECHNIQUE: Multidetector CT imaging of the chest was performed during intravenous contrast administration.  CONTRAST:  75 mL OMNIPAQUE IOHEXOL 300 MG/ML  SOLN  COMPARISON:  Single view of the chest 09/11/2014. CT chest 06/23/2008.  FINDINGS: Trace bilateral pleural effusions are seen. There is no pericardial effusion. Heart size is upper normal. Small hiatal hernia is unchanged in appearance. Calcific aortic and coronary atherosclerosis is noted. There is no axillary, hilar or mediastinal lymphadenopathy.  No pulmonary nodule or mass is seen in the right upper lobe or elsewhere in the chest. Opacity seen on prior chest film is likely due to vascular structures. There is no evidence of neoplastic process. The lungs demonstrate only mild dependent atelectasis.  Incidentally imaged upper abdomen is unremarkable. Bones demonstrate a mild inferior endplate compression fracture of T9 with vertebral body height loss with approximately 20%. The fracture is age indeterminate but new since the prior PA and lateral chest.  IMPRESSION: Negative for evidence of neoplasm. Right apical opacity seen on prior chest film is due to vascular structures.  Trace bilateral pleural effusions.  Calcific aortic and coronary atherosclerosis.  Small hiatal hernia.  Mild T9 inferior endplate compression fracture is age indeterminate but new since 2014.   Electronically Signed   By: Inge Rise M.D.   On: 09/14/2014 07:57    Medications: I have reviewed the patient's current medications. Scheduled Meds: . sodium chloride   Intravenous Once  . calcium carbonate  1 tablet Oral Q breakfast  . cholecalciferol  1,000 Units Oral Daily  . darifenacin  7.5 mg Oral Daily  . enoxaparin (LOVENOX) injection  40 mg Subcutaneous Q24H  . escitalopram  20  mg Oral Daily  . feeding supplement (ENSURE ENLIVE)  237 mL Oral BID BM  . levofloxacin  250 mg Oral Daily  . levothyroxine  25 mcg Oral QAC breakfast  . mirtazapine  15 mg Oral Daily  . multivitamin with minerals  1 tablet Oral Daily  . pantoprazole  40 mg Oral Daily   Continuous Infusions:   PRN Meds:.acetaminophen **OR** acetaminophen, albuterol, ALPRAZolam, alum & mag hydroxide-simeth, docusate sodium, menthol-cetylpyridinium **OR** phenol, methocarbamol **OR** methocarbamol (ROBAXIN)  IV, metoprolol, morphine injection, ondansetron **OR** ondansetron (ZOFRAN) IV, oxyCODONE Assessment/Plan: Principal Problem:   Fracture, proximal femur Active Problems:   Hypothyroidism   T2DM (type 2 diabetes mellitus)   Anemia   HYPERTENSION   Fall   UTI (lower urinary tract infection)   Chronic kidney disease (CKD), stage III (moderate)   Femur fracture, left   Opacity of lung on imaging study   Obesity   Fever   Compression fracture of body of thoracic vertebra   Abnormal chest x-ray   Acute blood loss anemia   Macrocytic anemia   Osteoporosis   Irregular heart beats   Acute Nondisplaced Spiral Left Femoral Diaphysis Fracture s/p closed reduction and intramedullary nailing on 09/12/14 in setting of osteoporosis - Pt with well-controlled pain.  -Appreciate orthopedics recommendations  -Touch down weight bearing  -PT recommends SNF -IV morphin 2 mg Q 4 hr PRN pain and oxycodone IR  5-10 Q 6hr PRN pain -Robaxin 500 mg Q 6 hr PRN muscle spasms  -Continue newly started oscal 1250 mg daily and home vitamin D 1000 U daily  -To consider bisphosphate therapy and DEXA  scan as outpatient  -Appreciate dietician consult, continue ensure BID in setting of prealbumin 16 -Continue aspirin 325 BID for 4 weeks for DVT prophylaxis per orthopedics   Acute on Chronic Macrocytic Anemia - Hg 8.6 s/p 1 pRBC below baseline 11 in setting of acute blood loss from surgery(200 mL) with no active bleeding.Pt at home on PO B12 supplements. Anemia panel with elevated B12 (1934). Smear review unremarkable. Possible in setting of hypothyroidism (however controlled) vs multiple myeloma (labs pending) vs myelodysplasia (however other counts stable). -Discontinue home B12 500 mcg daily due to elevated B12  -Pt needs outpatient hematology in setting of polyclonal gammopathy  -Follow-up SPEP -Obtain FOBT -Continue to monitor CBC  Complicated UTI -  Pt reports dysuria. Pt with chronic urinary incontinence. -Continue levaquin Day 5/10   -Follow-up repeat urine cultures -Continue home darifenacin 7.5 mg daily for chronic incontinence  -Discontinue home pyridium 200 mg PRN pain -Obtain US abdomen in setting of diffuse abdominal pain   Non-oliguric AKI on CKD Stage 3 - Cr improved to 1.66 from Cr 1.93 on admission with 1.5 per PCP. Pt  likely with prerenal azotemia in setting of volume depletion with concomitant ARB use in setting of probable CKD.FeNa 1.2% suggesting intrinsic renal etiology.  -Administer NS 75 mL/hr for 6 hrs  -Daily weights and strict I/O's  -Monitor BMP  -Avoid nephrotoxins, hold home losartan 75 mg daily -If does not improve, consider renal US   Hypothyroidism - Pt at home on synthroid 25 mcg daily. TSH level on 09/11/14 normal. Pt is s/p thyroidectomy.  -Continue home synthroid 25 mcg daily   Hypertension - Currently normotensive. Pt at home on losartan.  -Hold home losartan 75 mg daily in setting of AKI -IV lopressor 2.5 mg PRN SBP>180  Depression and Anxiety - Currently with stable mood.  -Continue home ambien 5 mg PRN  -Continue  home  mirtazapine 15 mg daily  -Continue home xanax 0.25 mg TID PRN -Continue home lexapro 20 mg daily   Non-insulin dependent Type 2 DM - A1c 5.6 on diet control.  -Continue to monitor blood sugars -Consider SSI if continues to be elevated  Mild CAD - Pt with intermittent chest pain recently worked up in January thought to be non-cardiac in nature. Pt at home on aspirin and losartan. Cardiac catheterization in 2003 showed mild CAD and normal systolic EF. Recent stress nuclear study in August 2014 was normal. -Continue aspirin 325 BID for 4 week for DVT prophylaxis then 81 mg daily    -Hold home losartan 75 mg daily in setting of AKI -Unclear why pt is not on BB and statin at home   GERD - Currently with no acid reflux symptoms -Continue home protonix 40 mg daily  HIV screening - Negative   Diet: Regular DVT Ppx: ASA 325 mg BID Code: Full    Dispo: SNF Disposition is deferred at this time, awaiting improvement of current medical problems.  Anticipated discharge in approximately 1 day(s).   The patient does have a current PCP Janifer Adie, MD) and does need an [facility] follow-up appointment after discharge.  The patient does have transportation limitations that hinder transportation to clinic appointments.  .Services Needed at time of discharge: Y = Yes, Blank = No PT:   OT:   RN:   Equipment:   Other:     LOS: 4 days   Juluis Mire, MD 09/15/2014, 7:26 AM

## 2014-09-15 NOTE — Clinical Social Work Note (Signed)
CSW spoke with [facility] Social Worker, Raquel Sarna, regarding patient's discharge disposition. Plan is for patient to discharge to [facility] once medically stable for discharge.  CSW to continue to follow and assist with discharge planning needs.  Lubertha Sayres, [place] Cell: [phone]       Fax: [phone] Clinical Social Work: Orthopedics [phone]) and Surgical [phone])

## 2014-09-15 NOTE — Progress Notes (Signed)
Subjective: Patient awake in bed, eating breakfast. Says she feels better. Pain well controlled on current regimen. Denies chest pain, cough, difficulty breathing. Endorses ongoing dysuria.   Objective: Vital signs in last 24 hours: Filed Vitals:   09/15/14 0132 09/15/14 0400 09/15/14 0500 09/15/14 0539  BP: 126/41   135/41  Pulse: 79   82  Temp: 100.8 F (38.2 C)   97.5 F (36.4 C)  TempSrc: Oral   Axillary  Resp: _0 Height:      Weight:   97.523 kg (215 lb)   SpO2: 97% 98%  100%   Weight change:   Intake/Output Summary (Last 24 hours) at 09/15/14 0752 Last data filed at 09/15/14 0132  Gross per 24 hour  Intake  897.5 ml  Output      0 ml  Net  897.5 ml   General: resting in bed HEENT: PERRL, EOMI, no scleral icterus Cardiac: irregular rhythm, no rubs, murmurs or gallops Pulm: clear to auscultation bilaterally, moving normal volumes of air Abd: soft, mildly TTP RUQ, nondistended, BS present Ext: warm and well perfused, trace pedal edema Neuro: alert and oriented X3, cranial nerves II-XII grossly intact  Lab Results: Basic Metabolic Panel:  Recent Labs  09/13/14 0715 09/14/14 0950 09/15/14 0645  NA 140 139 139  K 4.4 4.8 4.7  CL 114* 113* 111  CO2 _1 GLUCOSE 151* 155* 132*  BUN 17 21 25*  CREATININE 1.42* 1.58* 1.66*  CALCIUM 7.7* 8.3* 8.4  MG 2.0  --   --    CBC:  Recent Labs  09/14/14 0950 09/15/14 0645  WBC 8.5 10.7*  NEUTROABS 5.3  --   HGB 7.1* 8.6*  HCT 22.1* 26.0*  MCV 105.7* 101.2*  PLT 120* 123*   CBG:  Recent Labs  09/12/14 1616 09/12/14 1849 09/12/14 2141 09/13/14 1208 09/13/14 1648  GLUCAP 123* 143* 142* 201* 179*     Micro Results: Recent Results (from the past 240 hour(s))  Surgical pcr screen     Status: None   Collection Time: 09/12/14  8:26 AM  Result Value Ref Range Status   MRSA, PCR NEGATIVE NEGATIVE Final   Staphylococcus aureus NEGATIVE NEGATIVE Final    Comment:        The Xpert SA Assay  (FDA approved for NASAL specimens in patients over 66 years of age), is one component of a comprehensive surveillance program.  Test performance has been validated by [facility] for patients greater than or equal to 42 year old. It is not intended to diagnose infection nor to guide or monitor treatment.   Urine culture     Status: None   Collection Time: 09/12/14  1:40 PM  Result Value Ref Range Status   Specimen Description URINE, RANDOM  Final   Special Requests NONE  Final   Colony Count NO GROWTH Performed at [facility]   Final   Culture NO GROWTH Performed at [facility]   Final   Report Status 09/13/2014 FINAL  Final   Studies/Results: Ct Chest W Contrast  09/14/2014   CLINICAL DATA:  Left-sided chest pain. Right upper lobe opacity by plain film of the chest.  EXAM: CT CHEST WITH CONTRAST  TECHNIQUE: Multidetector CT imaging of the chest was performed during intravenous contrast administration.  CONTRAST:  75 mL OMNIPAQUE IOHEXOL 300 MG/ML  SOLN  COMPARISON:  Single view of the chest 09/11/2014. CT chest 06/23/2008.  FINDINGS: Trace bilateral pleural effusions  are seen. There is no pericardial effusion. Heart size is upper normal. Small hiatal hernia is unchanged in appearance. Calcific aortic and coronary atherosclerosis is noted. There is no axillary, hilar or mediastinal lymphadenopathy.  No pulmonary nodule or mass is seen in the right upper lobe or elsewhere in the chest. Opacity seen on prior chest film is likely due to vascular structures. There is no evidence of neoplastic process. The lungs demonstrate only mild dependent atelectasis.  Incidentally imaged upper abdomen is unremarkable. Bones demonstrate a mild inferior endplate compression fracture of T9 with vertebral body height loss with approximately 20%. The fracture is age indeterminate but new since the prior PA and lateral chest.  IMPRESSION: Negative for evidence of neoplasm. Right apical opacity  seen on prior chest film is due to vascular structures.  Trace bilateral pleural effusions.  Calcific aortic and coronary atherosclerosis.  Small hiatal hernia.  Mild T9 inferior endplate compression fracture is age indeterminate but new since 2014.   Electronically Signed   By: Inge Rise M.D.   On: 09/14/2014 07:57   Medications: I have reviewed the patient's current medications. Scheduled Meds: . sodium chloride   Intravenous Once  . calcium carbonate  1 tablet Oral Q breakfast  . cholecalciferol  1,000 Units Oral Daily  . darifenacin  7.5 mg Oral Daily  . enoxaparin (LOVENOX) injection  40 mg Subcutaneous Q24H  . escitalopram  20 mg Oral Daily  . feeding supplement (ENSURE ENLIVE)  237 mL Oral BID BM  . levofloxacin  250 mg Oral Daily  . levothyroxine  25 mcg Oral QAC breakfast  . mirtazapine  15 mg Oral Daily  . multivitamin with minerals  1 tablet Oral Daily  . pantoprazole  40 mg Oral Daily   Continuous Infusions:  PRN Meds:.acetaminophen **OR** acetaminophen, albuterol, ALPRAZolam, alum & mag hydroxide-simeth, docusate sodium, menthol-cetylpyridinium **OR** phenol, methocarbamol **OR** methocarbamol (ROBAXIN)  IV, metoprolol, morphine injection, ondansetron **OR** ondansetron (ZOFRAN) IV, oxyCODONE Assessment/Plan: Principal Problem:   Fracture, proximal femur Active Problems:   Hypothyroidism   T2DM (type 2 diabetes mellitus)   Anemia   HYPERTENSION   Fall   UTI (lower urinary tract infection)   Chronic kidney disease (CKD), stage III (moderate)   Femur fracture, left   Opacity of lung on imaging study   Obesity   Fever   Compression fracture of body of thoracic vertebra   Abnormal chest x-ray   Acute blood loss anemia   Macrocytic anemia   Osteoporosis   Irregular heart beats  Nondisplaced spiral left femoral diaphysis fracture proximal femur s/p closed reduction and IM nailing on 09/12/14 2/2 osteoporosis:CT chest on 4/6 c/w new T9 inferior endplate  compression fracture, age indeterminate on CT chest.   Orthopedics requested laboratory tests as below for work-up of fragility fracture:  CBC - abnl, continued anemia (as noted below),  ESR - 38 (high) serum calcium - 8.0, corrected to 9.4 with albumin 2.2 Creatinine - 1.77 (from 1.93) Albumin - 2.2 (low) Phosphate - 1.7 (low) alkaline phosphatase - nl  liver transaminases - nl protein electrophoresis - see below Urinalysis - +nitrite/LE 25-hydroxyvitamin D - 31 (nl) - PT to follow today for possible TDWB - morphine 40m/ml IV for severe pain, q4 PRN, will try and transition to PO pain medications. - oxycodone IR [id number] PRN - consider bisphosphonate or DEXA on d/c will defer to PCP  - d/c lovenox per othropedics recs; 4 weeks ASA [id number] - follow-up for suture removal with ortho  in 2 weeks - SNF on d/c  Complicated UTI: Febrile to 100.8 on 4/7 @ 130am. Dysuria reported on 4/7. Fever may be post-operative vs 2/2 UTI. WBC elevated to 10.7, again possibly 2/2 post-op demargination. Urine cx frm 4/3 showed no growth. Day 5 abx. Per nurse, no evidence of fungal infection or skin irritation in genitoanal region that could be contributing to dysuric symptoms.  - follow-up urine culture from 4/6 - levofloxacin 214m qday x 4days (total of 10 days) - RUQ U/S  AKI: Creatinine 1.93 on admission. Decreased to 1.5 (4/6) and increased to 1.66 on 4/7, possibly 2/2 to contrast received on 4/6 for chest CT. No symptoms of fluid overload. - NS _0 /hr x 6hours - avoid NSAIDs  Acute blood loss superimposed on chronic macrocytic anemia w/ polyclonal gammopathy: Hbg now 8.6 from 7.1 after 1u RBC transfusion. Wrt chronic macrocytic anemia, folate RBC 2076, wnl. Macrocytosis resolving (101.2 on 4/7). Normal B12, folate. Work-up for possible multiple myeloma  demonstrated: elevated IgA (522) consistent with polyclonal gammopathy. Kappa and lambda typing also increased. Total protein (urine) increased (139), indicative of polyclonal increase in free kappa and/or free lambda light chains. No monoclonal free light chains (Bence Jones Proteins) detected, reducing likelihood of multiple myeloma. Multiple etiologies potentially associated polyclonal gammopathy; in retrospective study of patients with polyclonal gammopathy, Liver disease was the most common single disease association in 79 (61%) of 130 patients, followed by connective tissue diseases in 28 (22%), chronic infections in 8 (6%), hematologic disorders in 6 (5%), and nonhematologic malignancies in 4 (3%). Have r/o liver ds. Connective tissue ds possible, chronic infection less likely. Per orthopedics, if hemoglobin stable post transfusion, can d/c to SNF. - AM CBC - hematology f/u post d/c  Irregular Heart rhythm: irregular beats with missed beats appreciated on exam on 4/7.  - continue telemetry.   T2DM: Glucose elevated to 175 on admission 132 on 4/7. Has history of diet-controlled ds. Patient not currently on any oral medications for glucose control. A1C 5.6% - continue diet-controlled management  HTN: managed on losartan. - hold home losartan in setting of AKI - metoprolol 2.514mprn for SBP>180  Hypothyroidism: History of thyroidectomy. Patient receives synthroid at home. TSH 0.406 (wnl) - continue home synthroid, PO 2522m DVT Ppx: ASA Code: Full Diet: full Dispo: Disposition is deferred at this time, awaiting improvement of current medical problems. Likely will require SNF placement. Anticipated discharge in approximately 2 day(s).   The patient does have a current PCP (RoJanifer AdieD) and [facility].  The patient does not have transportation limitations that hinder transportation to clinic appointments. .Services Needed at  time of discharge: Y = Yes, Blank = No PT:   OT:   RN:   Equipment:   Other: SNF    LOS: 4 days   SarCarolan Shivered Student 09/15/2014, 7:52 AM

## 2014-09-15 NOTE — Progress Notes (Signed)
Physical Therapy Treatment Patient Details Name: Alexandra Booth MRN: [id number] DOB: 24-Dec-1923 Today's Date: 09/15/2014    History of Present Illness 81 y.o. female admitted to [facility] on 09/11/14 s/p fall with left femur fx.  She is now s/p IM nail and is TDWB.  She has significant PMHx of lumbar leminectomy.  No other history listed in chart.    PT Comments    Pt was able to initiate more and help just a bit more with her mobility today compared to evaluation.  She could sit EOB unsupported in midline today with supervision and seemed to tolerate LE exercises better.  She is still not strong enough, even with two person assist, to attempt standing and maintain TDWB on her left leg.  PT will continue to follow acutely and she is still appropriate for SNF level rehab at discharge.   Follow Up Recommendations  SNF     Equipment Recommendations  Hospital bed;Other (comment) (hoyer lift)    Recommendations for Other Services   NA     Precautions / Restrictions Precautions Precautions: Fall Restrictions LLE Weight Bearing: Touchdown weight bearing    Mobility  Bed Mobility Overal bed mobility: Needs Assistance;+2 for physical assistance Bed Mobility: Supine to Sit;Sit to Supine     Supine to sit: +2 for physical assistance;Max assist;HOB elevated Sit to supine: +2 for physical assistance;Max assist;HOB elevated   General bed mobility comments: Two person max assist to support leg and trunk during transition to sitting. HOB maximally raised and pt initiating, but not helping much with transitions.  She did help more today compared to last session.   Transfers                 General transfer comment: Pt unable to physically stand and maintain TDWB on her left foot.  She will need to be lifted OOB to chair, but is planning on going to a test anytime now and RN would like for her to say in bed.       Balance Overall balance assessment: Needs assistance Sitting-balance  support: Feet supported;Bilateral upper extremity supported Sitting balance-Leahy Scale: Fair Sitting balance - Comments: Better sitting balance today.  She is able to sit in midline with bil upper extremity support and supervision.                             Cognition Arousal/Alertness: Lethargic Behavior During Therapy: Flat affect Overall Cognitive Status: No family/caregiver present to determine baseline cognitive functioning       Memory: Decreased short-term memory (pt doesn't remeber working with this PT before)              Exercises Total Joint Exercises Ankle Circles/Pumps: AAROM;Both;20 reps;Supine;Seated Heel Slides: AAROM;Both;10 reps;Supine Hip ABduction/ADduction: AAROM;Left;10 reps;Supine Long Arc Quad: AROM;Both;10 reps;Seated General Exercises - Upper Extremity Shoulder Flexion: AAROM;Both;10 reps;Seated Elbow Flexion: AAROM;Both;10 reps;Seated Elbow Extension: AAROM;Both;10 reps;Seated Other Exercises Other Exercises: seated hip flexion AAROM x 10 reps right.     General Comments General comments (skin integrity, edema, etc.): O2 sats and HR stable on O2 via [place] despite 3/4 DOE with mobility and seated TE.       Pertinent Vitals/Pain Pain Assessment: Faces Faces Pain Scale: Hurts whole lot Pain Location: left leg with mobility and ROM with PT.  Per RN she has had nothing for pain today because when asked at rest she is not reporting any pain.  Pain Descriptors / Indicators: Aching;Burning  Pain Intervention(s): Limited activity within patient's tolerance;Monitored during session;Repositioned;Patient requesting pain meds-RN notified           PT Goals (current goals can now be found in the care plan section) Acute Rehab PT Goals Patient Stated Goal: to get back to her baseline Progress towards PT goals: Progressing toward goals    Frequency  Min 3X/week    PT Plan Current plan remains appropriate       End of Session Equipment  Utilized During Treatment: Oxygen (throughout session today) Activity Tolerance: Patient limited by pain;Patient limited by fatigue Patient left: in bed;with call bell/phone within reach;with bed alarm set;with SCD's reapplied     Time: [id number] PT Time Calculation (min) (ACUTE ONLY): 22 min  Charges:  $Therapeutic Activity: 8-22 mins                      Yahshua Thibault B. [place], [place], DPT [phone]   09/15/2014, 4:57 PM

## 2014-09-16 LAB — BASIC METABOLIC PANEL
ANION GAP: 4 — AB (ref 5–15)
BUN: 31 mg/dL — ABNORMAL HIGH (ref 6–23)
CO2: 23 mmol/L (ref 19–32)
Calcium: 8.5 mg/dL (ref 8.4–10.5)
Chloride: 112 mmol/L (ref 96–112)
Creatinine, Ser: 1.97 mg/dL — ABNORMAL HIGH (ref 0.50–1.10)
GFR calc Af Amer: 25 mL/min — ABNORMAL LOW (ref >90)
GFR calc non Af Amer: 22 mL/min — ABNORMAL LOW (ref >90)
Glucose, Bld: 148 mg/dL — ABNORMAL HIGH (ref 70–99)
Potassium: 4.5 mmol/L (ref 3.5–5.1)
Sodium: 139 mmol/L (ref 135–145)

## 2014-09-16 LAB — CBC
HCT: 23.1 % — ABNORMAL LOW (ref 36.0–46.0)
HEMATOCRIT: 26 % — AB (ref 36.0–46.0)
HEMOGLOBIN: 8.4 g/dL — AB (ref 12.0–15.0)
Hemoglobin: 7.6 g/dL — ABNORMAL LOW (ref 12.0–15.0)
MCH: 32.6 pg (ref 26.0–34.0)
MCH: 32.7 pg (ref 26.0–34.0)
MCHC: 32.9 g/dL (ref 30.0–36.0)
MCHC: 32.3 g/dL (ref 30.0–36.0)
MCV: 99.1 fL (ref 78.0–100.0)
MCV: 101.2 fL — ABNORMAL HIGH (ref 78.0–100.0)
PLATELETS: 156 10*3/uL (ref 150–400)
Platelets: 174 10*3/uL (ref 150–400)
RBC: 2.33 MIL/uL — ABNORMAL LOW (ref 3.87–5.11)
RBC: 2.57 MIL/uL — ABNORMAL LOW (ref 3.87–5.11)
RDW: 14.7 % (ref 11.5–15.5)
RDW: 14.5 % (ref 11.5–15.5)
WBC: 9.5 10*3/uL (ref 4.0–10.5)
WBC: 7.8 10*3/uL (ref 4.0–10.5)

## 2014-09-16 LAB — GLUCOSE, CAPILLARY: Glucose-Capillary: 134 mg/dL — ABNORMAL HIGH (ref 70–99)

## 2014-09-16 MED ORDER — SODIUM CHLORIDE 0.9 % IV SOLN
INTRAVENOUS | Status: DC
  Administered 2014-09-16: 11:00:00 via INTRAVENOUS

## 2014-09-16 MED ORDER — DOCUSATE SODIUM 100 MG PO CAPS
100.0000 mg | ORAL_CAPSULE | Freq: Two times a day (BID) | ORAL | Status: DC
  Administered 2014-09-16 – 2014-09-19 (×7): 100 mg via ORAL
  Filled 2014-09-16 (×7): qty 1

## 2014-09-16 NOTE — Progress Notes (Signed)
Subjective: Patient denies any dysuria. Reports periodic shooting pain in right leg, denies pain in left leg. Denies abdominal pain. Denies having had any bowel movements since admission. Endorses sensation of room spinning when being moved from lying to seated in bed - says she has had this sensation previously and believes it contributed to her fall at home.   Objective: Vital signs in last 24 hours: Filed Vitals:   09/16/14 0336 09/16/14 0500 09/16/14 0800 09/16/14 1200  BP:  142/51    Pulse:  81    Temp:  97.8 F (36.6 C)    TempSrc:  Axillary    Resp: 14 17 18 18   Height:      Weight:      SpO2: 97% 96%     Weight change:   Intake/Output Summary (Last 24 hours) at 09/16/14 1247 Last data filed at 09/16/14 0900  Gross per 24 hour  Intake    480 ml  Output      0 ml  Net    480 ml   General: resting in bed HEENT: PERRL, EOMI, no scleral icterus Cardiac: RRR, no rubs, murmurs or gallops Pulm: clear to auscultation bilaterally, moving normal volumes of air Abd: soft, nontender, nondistended, BS present Ext: warm and well perfused, no pedal edema Neuro: alert and oriented X3, cranial nerves II-XII grossly intact  Lab Results: Basic Metabolic Panel:  Recent Labs  09/15/14 0645 09/16/14 0420  NA 139 139  K 4.7 4.5  CL 111 112  CO2 24 23  GLUCOSE 132* 148*  BUN 25* 31*  CREATININE 1.66* 1.97*  CALCIUM 8.4 8.5   CBC:  Recent Labs  09/14/14 0950 09/15/14 0645 09/16/14 0420  WBC 8.5 10.7* 9.5  NEUTROABS 5.3  --   --   HGB 7.1* 8.6* 7.6*  HCT 22.1* 26.0* 23.1*  MCV 105.7* 101.2* 99.1  PLT 120* 123* 156   CBG:  Recent Labs  09/13/14 1648  GLUCAP 179*   Micro Results: Recent Results (from the past 240 hour(s))  Surgical pcr screen     Status: None   Collection Time: 09/12/14  8:26 AM  Result Value Ref Range Status   MRSA, PCR NEGATIVE NEGATIVE Final   Staphylococcus aureus NEGATIVE NEGATIVE Final    Comment:        The Xpert SA Assay  (FDA approved for NASAL specimens in patients over 82 years of age), is one component of a comprehensive surveillance program.  Test performance has been validated by [facility] for patients greater than or equal to 91 year old. It is not intended to diagnose infection nor to guide or monitor treatment.   Urine culture     Status: None   Collection Time: 09/12/14  1:40 PM  Result Value Ref Range Status   Specimen Description URINE, RANDOM  Final   Special Requests NONE  Final   Colony Count NO GROWTH Performed at [facility]   Final   Culture NO GROWTH Performed at [facility]   Final   Report Status 09/13/2014 FINAL  Final  Urine culture     Status: None   Collection Time: 09/14/14 12:49 PM  Result Value Ref Range Status   Specimen Description URINE, RANDOM  Final   Special Requests NONE  Final   Colony Count NO GROWTH Performed at [facility]   Final   Culture NO GROWTH Performed at [facility]   Final   Report Status 09/15/2014 FINAL  Final   Studies/Results: US Abdomen Complete  09/15/2014   CLINICAL DATA:  Generalized abdominal pain. Prior cholecystectomy. Chronic kidney disease stage 3.  EXAM: ULTRASOUND ABDOMEN COMPLETE  COMPARISON:  None.  FINDINGS: Gallbladder: Surgically absent.  Common bile duct: Diameter: 6 mm, within normal limits.  Liver: No focal lesion identified. Within normal limits in parenchymal echogenicity.  IVC: No abnormality visualized.  Pancreas: Visualized portion unremarkable.  Spleen: Size and appearance within normal limits.  Right Kidney: Length: 10.7 cm. Echogenicity within normal limits. Simple appearing cyst in the lower pole measuring 4.6 cm in maximum diameter. No mass or hydronephrosis visualized.  Left Kidney: Length: 11.1 cm. Echogenicity within normal limits. No mass or hydronephrosis visualized.  Abdominal aorta: No aneurysm visualized.  Other findings: None.  IMPRESSION: Prior cholecystectomy. No  evidence of biliary ductal dilatation or other acute findings.  4.6 cm right renal cyst incidentally noted. No evidence hydronephrosis.   Electronically Signed   By: Earle Gell M.D.   On: 09/15/2014 19:30   Medications: I have reviewed the patient's current medications. Scheduled Meds: . aspirin EC  325 mg Oral BID  . calcium carbonate  1 tablet Oral Q breakfast  . cholecalciferol  1,000 Units Oral Daily  . darifenacin  7.5 mg Oral Daily  . docusate sodium  100 mg Oral BID  . escitalopram  20 mg Oral Daily  . feeding supplement (ENSURE ENLIVE)  237 mL Oral BID BM  . levothyroxine  25 mcg Oral QAC breakfast  . mirtazapine  15 mg Oral Daily  . multivitamin with minerals  1 tablet Oral Daily  . pantoprazole  40 mg Oral Daily  . polyethylene glycol  17 g Oral Daily   Continuous Infusions: . sodium chloride     PRN Meds:.acetaminophen **OR** acetaminophen, albuterol, ALPRAZolam, alum & mag hydroxide-simeth, menthol-cetylpyridinium **OR** phenol, methocarbamol **OR** methocarbamol (ROBAXIN)  IV, metoprolol, ondansetron **OR** ondansetron (ZOFRAN) IV, oxyCODONE Assessment/Plan: Principal Problem:   Fracture, proximal femur Active Problems:   Hypothyroidism   T2DM (type 2 diabetes mellitus)   Anemia   HYPERTENSION   Fall   UTI (lower urinary tract infection)   Chronic kidney disease (CKD), stage III (moderate)   Femur fracture, left   Opacity of lung on imaging study   Obesity   Fever   Compression fracture of body of thoracic vertebra   Abnormal chest x-ray   Acute blood loss anemia   Macrocytic anemia   Osteoporosis   Irregular heart beats  Nondisplaced spiral left femoral diaphysis fracture proximal femur s/p closed reduction and IM nailing on 09/12/14 2/2 osteoporosis:PT reevaluated 4/7- patient improved in terms of mobility, still not strong enough to attempt standing. Pain well controlled on PO pain medications - PT following - TDWB per ortho recs - oxycodone IR 5-10mg  q6  PRN - robaxin 500mg  Q6hr for muscle spasms - prealbumin 16, continue nutrition recs of Ensure Enlive po BID - consider bisphosphonate or DEXA on d/c will defer to PCP  - continue 4 weeks ASA 325mg  BID - follow-up for suture removal with ortho in 2 weeks - hospital bed for d/c  Complicated UTI: Afebrile last 24 hours. Denies dysuria on 4/8. Follow-up urine culture negative (urine cx frm 4/3 showed no growth). Completed 5-days abx.  - d/c levofloxacin  AKI on CKD Stage 3: Creatinine 1.93 on admission. Baseline per PCP 02/2014 was 1.51. While inpatient, Cr decreased to 1.5 (4/6) and now increased to 1.93 on 4/8, possibly 2/2 to contrast received on 4/6 for  chest CT. Received 471ml NS on 4/7. No symptoms of fluid overload. - NS @75cc /hr x 24 hours - avoid NSAIDs  Acute blood loss superimposed on chronic macrocytic anemia w/ polyclonal gammopathy: Hbg decreased from 8.6 to 7.6 on 4/8. Received 1uRBC transfusion on 4/6 2/2 Hgb 7.1. Patient is now symptomatic with dizziness on seating, though complains of similar symptoms at baseline. Cannot assess orthostatic vital signs in setting of immobility. Have not been able to assess possible GI source of bleed in that patient has not had BM. Wrt chronic macrocytic anemia, extensive work-up not demonstrating clear etiology.  - follow-up FOBT - follow-up afternoon CBC - hematology f/u post d/c for workup of polyclonal gammopathy - d/c home B12 in setting of elevated B12  Constipation: no response to prn miralax and colace.  - scheduled colace 100mg  BID and miralax 17g qdaily  T2DM: Glucose elevated to 175 on admission 132 on 4/7. Has history of diet-controlled ds. Patient not currently on any oral medications for glucose control. A1C 5.6% - continue diet-controlled management  HTN: managed on losartan. - hold home losartan in setting of AKI - metoprolol 2.5mg  prn for SBP>180  Hypothyroidism: History of thyroidectomy. Patient receives synthroid at  home. TSH 0.406 (wnl) - continue home synthroid, PO 33mcg  DVT Ppx: ASA & SCDs Code: Full Diet: full Dispo: Disposition is deferred at this time, awaiting improvement of current medical problems. Likely will require SNF placement. Anticipated discharge in approximately 1 day(s).   The patient does have a current PCP Janifer Adie, MD) and does need an [facility] hospital follow-up appointment after discharge.  The patient does not have transportation limitations that hinder transportation to clinic appointments. .Services Needed at time of discharge: Y = Yes, Blank = No PT:   OT:   RN:   Equipment:   Other:     LOS: 5 days   Carolan Shiver, Med Student 09/16/2014, 12:47 PM

## 2014-09-16 NOTE — Progress Notes (Signed)
Subjective:  Pt seen and examined in AM. No acute events overnight. US abdomen yesterday with no acute abnormality. Her left leg pain is well controlled. She denies abdominal pain, nausea, or vomiting.  Her dysuria has improved.           Objective: Vital signs in last 24 hours: Filed Vitals:   09/16/14 0336 09/16/14 0500 09/16/14 0800 09/16/14 1200  BP:  142/51    Pulse:  81    Temp:  97.8 F (36.6 C)    TempSrc:  Axillary    Resp: 14 17 18 18   Height:      Weight:      SpO2: 97% 96%     Weight change:   Intake/Output Summary (Last 24 hours) at 09/16/14 1304 Last data filed at 09/16/14 0900  Gross per 24 hour  Intake    480 ml  Output      0 ml  Net    480 ml    Physical Exam: General: NAD Heart: Normal rate and rhythm  Lungs: Anterior fields clear to auscultation  Abdomen: Soft, non-tender, non-distended, normal BS.  Extremities: No left femoral tenderness.  Neuro: A & O x 3  Lab Results: Basic Metabolic Panel:  Recent Labs Lab 09/11/14 1735  09/13/14 0715  09/15/14 0645 09/16/14 0420  NA  --   < > 140  < > 139 139  K  --   < > 4.4  < > 4.7 4.5  CL  --   < > 114*  < > 111 112  CO2  --   < > 23  < > 24 23  GLUCOSE  --   < > 151*  < > 132* 148*  BUN  --   < > 17  < > 25* 31*  CREATININE  --   < > 1.42*  < > 1.66* 1.97*  CALCIUM  --   < > 7.7*  < > 8.4 8.5  MG 1.5  --  2.0  --   --   --   PHOS 1.7*  --   --   --   --   --   < > = values in this interval not displayed. Liver Function Tests:  Recent Labs Lab 09/11/14 1735  AST 26  ALT 22  ALKPHOS 51  BILITOT 0.5  PROT 5.2*  ALBUMIN 2.2*   No results for input(s): LIPASE, AMYLASE in the last 168 hours. No results for input(s): AMMONIA in the last 168 hours. CBC:  Recent Labs Lab 09/11/14 1130  09/14/14 0950 09/15/14 0645 09/16/14 0420  WBC 9.3  < > 8.5 10.7* 9.5  NEUTROABS 8.1*  --  5.3  --   --   HGB 11.2*  < > 7.1* 8.6* 7.6*  HCT 34.2*  < > 22.1* 26.0* 23.1*  MCV 104.0*  < >  105.7* 101.2* 99.1  PLT 187  < > 120* 123* 156  < > = values in this interval not displayed. Cardiac Enzymes:  Recent Labs Lab 09/11/14 1130 09/12/14 0511  CKTOTAL 214* 619*  TROPONINI <0.03  --    BNP: No results for input(s): PROBNP in the last 168 hours. D-Dimer: No results for input(s): DDIMER in the last 168 hours. CBG:  Recent Labs Lab 09/12/14 1242 09/12/14 1616 09/12/14 1849 09/12/14 2141 09/13/14 1208 09/13/14 1648  GLUCAP 126* 123* 143* 142* 201* 179*   Hemoglobin A1C:  Recent Labs Lab 09/11/14 1735  HGBA1C 5.6  Fasting Lipid Panel: No results for input(s): CHOL, HDL, LDLCALC, TRIG, CHOLHDL, LDLDIRECT in the last 168 hours. Thyroid Function Tests:  Recent Labs Lab 09/11/14 1735  TSH 0.406   Coagulation:  Recent Labs Lab 09/11/14 1130  LABPROT 14.9  INR 1.16   Anemia Panel:  Recent Labs Lab 09/11/14 1735  VITAMINB12 1934*  FOLATE >20.0  FERRITIN 90  TIBC 285  IRON 33*  RETICCTPCT 2.3   Urine Drug Screen: Drugs of Abuse  No results found for: LABOPIA, COCAINSCRNUR, LABBENZ, AMPHETMU, THCU, LABBARB  Alcohol Level: No results for input(s): ETH in the last 168 hours. Urinalysis:  Recent Labs Lab 09/11/14 1147  COLORURINE ORANGE*  LABSPEC 1.020  PHURINE 5.0  GLUCOSEU NEGATIVE  HGBUR MODERATE*  BILIRUBINUR NEGATIVE  KETONESUR NEGATIVE  PROTEINUR 100*  UROBILINOGEN 1.0  NITRITE POSITIVE*  LEUKOCYTESUR LARGE*    Micro Results: Recent Results (from the past 240 hour(s))  Surgical pcr screen     Status: None   Collection Time: 09/12/14  8:26 AM  Result Value Ref Range Status   MRSA, PCR NEGATIVE NEGATIVE Final   Staphylococcus aureus NEGATIVE NEGATIVE Final    Comment:        The Xpert SA Assay (FDA approved for NASAL specimens in patients over 13 years of age), is one component of a comprehensive surveillance program.  Test performance has been validated by [facility] for patients greater than or equal to 26 year  old. It is not intended to diagnose infection nor to guide or monitor treatment.   Urine culture     Status: None   Collection Time: 09/12/14  1:40 PM  Result Value Ref Range Status   Specimen Description URINE, RANDOM  Final   Special Requests NONE  Final   Colony Count NO GROWTH Performed at [facility]   Final   Culture NO GROWTH Performed at [facility]   Final   Report Status 09/13/2014 FINAL  Final  Urine culture     Status: None   Collection Time: 09/14/14 12:49 PM  Result Value Ref Range Status   Specimen Description URINE, RANDOM  Final   Special Requests NONE  Final   Colony Count NO GROWTH Performed at [facility]   Final   Culture NO GROWTH Performed at [facility]   Final   Report Status 09/15/2014 FINAL  Final   Studies/Results: US Abdomen Complete  09/15/2014   CLINICAL DATA:  Generalized abdominal pain. Prior cholecystectomy. Chronic kidney disease stage 3.  EXAM: ULTRASOUND ABDOMEN COMPLETE  COMPARISON:  None.  FINDINGS: Gallbladder: Surgically absent.  Common bile duct: Diameter: 6 mm, within normal limits.  Liver: No focal lesion identified. Within normal limits in parenchymal echogenicity.  IVC: No abnormality visualized.  Pancreas: Visualized portion unremarkable.  Spleen: Size and appearance within normal limits.  Right Kidney: Length: 10.7 cm. Echogenicity within normal limits. Simple appearing cyst in the lower pole measuring 4.6 cm in maximum diameter. No mass or hydronephrosis visualized.  Left Kidney: Length: 11.1 cm. Echogenicity within normal limits. No mass or hydronephrosis visualized.  Abdominal aorta: No aneurysm visualized.  Other findings: None.  IMPRESSION: Prior cholecystectomy. No evidence of biliary ductal dilatation or other acute findings.  4.6 cm right renal cyst incidentally noted. No evidence hydronephrosis.   Electronically Signed   By: Earle Gell M.D.   On: 09/15/2014 19:30   Medications: I have  reviewed the patient's current medications. Scheduled Meds: . aspirin EC  325 mg Oral  BID  . calcium carbonate  1 tablet Oral Q breakfast  . cholecalciferol  1,000 Units Oral Daily  . darifenacin  7.5 mg Oral Daily  . docusate sodium  100 mg Oral BID  . escitalopram  20 mg Oral Daily  . feeding supplement (ENSURE ENLIVE)  237 mL Oral BID BM  . levothyroxine  25 mcg Oral QAC breakfast  . mirtazapine  15 mg Oral Daily  . multivitamin with minerals  1 tablet Oral Daily  . pantoprazole  40 mg Oral Daily  . polyethylene glycol  17 g Oral Daily   Continuous Infusions: . sodium chloride     PRN Meds:.acetaminophen **OR** acetaminophen, albuterol, ALPRAZolam, alum & mag hydroxide-simeth, menthol-cetylpyridinium **OR** phenol, methocarbamol **OR** methocarbamol (ROBAXIN)  IV, metoprolol, ondansetron **OR** ondansetron (ZOFRAN) IV, oxyCODONE Assessment/Plan: Principal Problem:   Fracture, proximal femur Active Problems:   Hypothyroidism   T2DM (type 2 diabetes mellitus)   Anemia   HYPERTENSION   Fall   UTI (lower urinary tract infection)   Chronic kidney disease (CKD), stage III (moderate)   Femur fracture, left   Opacity of lung on imaging study   Obesity   Fever   Compression fracture of body of thoracic vertebra   Abnormal chest x-ray   Acute blood loss anemia   Macrocytic anemia   Osteoporosis   Irregular heart beats   Acute Nondisplaced Spiral Left Femoral Diaphysis Fracture s/p closed reduction and intramedullary nailing on 09/12/14 in setting of osteoporosis - Pt with well-controlled pain.  -Appreciate orthopedics recommendations  -Touch down weight bearing  -PT recommends SNF -Oxycodone IR  5-10 Q 6hr PRN pain -Robaxin 500 mg Q 6 hr PRN muscle spasms  -Start colace 100 mg BID and miralax daily due to constipation  -Continue newly started oscal 1250 mg daily and home vitamin D 1000 U daily  -To consider bisphosphate therapy and DEXA scan as outpatient  -Appreciate  dietician consult, continue ensure BID in setting of prealbumin 16 -Continue aspirin 325 BID for 4 weeks for DVT prophylaxis per orthopedics. Pt also on SCD's.   Acute on Chronic Macrocytic Anemia - Hg 7.6 s/p 1 pRBC on 4/6/ below baseline 11 in setting of acute blood loss from surgery (200 mL) with no active bleeding.Pt at home on PO B12 supplements. Anemia panel with elevated B12 (1934). Smear review unremarkable. Possible in setting of hypothyroidism (however controlled) vs multiple myeloma (labs pending) vs myelodysplasia (however other counts stable). -Discontinue home B12 500 mcg daily due to elevated B12  -Pt needs outpatient hematology in setting of polyclonal gammopathy  -Obtain FOBT -Continue to monitor CBC and transfuse if Hg <7  Non-oliguric AKI on CKD Stage 3 - Cr 1.97 from 1.66 yesterday in setting of recent IV contrast below baselne 1.5 per PCP. Prior calculated FeNa was 1.2% suggesting intrinsic renal etiology. US abdomen with no hydronephrosis. -Administer NS 75 mL/hr for 24 hrs  -Daily weights and strict I/O's  -Monitor BMP  -Avoid nephrotoxins, hold home losartan 75 mg daily  Complicated UTI -  Pt reports resolved dysuria. Urinary cultures with no growth. Pt with chronic urinary incontinence. -Discontinue levaquin due to negative urine cultures (pt received 5 days)   -Continue home darifenacin 7.5 mg daily for chronic incontinence  -Discontinue home pyridium 200 mg PRN pain  Hypothyroidism - Pt at home on synthroid 25 mcg daily. TSH level on 09/11/14 normal. Pt is s/p thyroidectomy.  -Continue home synthroid 25 mcg daily   Hypertension - Currently normotensive. Pt at home  on losartan.  -Hold home losartan 75 mg daily in setting of AKI -IV lopressor 2.5 mg PRN SBP>180  Depression and Anxiety - Currently with stable mood.  -Continue home ambien 5 mg PRN  -Continue home mirtazapine 15 mg daily  -Continue home xanax 0.25 mg TID PRN -Continue home lexapro 20  mg daily   Non-insulin dependent Type 2 DM - Blood gluocse 148. A1c 5.6 on diet control.  -Continue to monitor blood sugars -Consider SSI if continues to be elevated  Mild CAD - Pt with intermittent chest pain recently worked up in January thought to be non-cardiac in nature. Pt at home on aspirin and losartan. Cardiac catheterization in 2003 showed mild CAD and normal systolic EF. Recent stress nuclear study in August 2014 was normal. -Continue aspirin 325 BID for 4 week for DVT prophylaxis then 81 mg daily    -Hold home losartan 75 mg daily in setting of AKI -Unclear why pt is not on BB and statin at home   GERD - Currently with no acid reflux symptoms -Continue home protonix 40 mg daily  HIV screening - Negative   Diet: Regular DVT Ppx: ASA 325 mg BID, SCD's Code: Full    Dispo: SNF Disposition is deferred at this time, awaiting improvement of current medical problems.  Anticipated discharge in approximately 1 day(s).   The patient does have a current PCP Janifer Adie, MD) and does need an [facility] hospital follow-up appointment after discharge.  The patient does have transportation limitations that hinder transportation to clinic appointments.  .Services Needed at time of discharge: Y = Yes, Blank = No PT:  Yes at SNF  OT:  Yes at SNF  RN:  No  Equipment:  Hospital bed   Other:     LOS: 5 days   Juluis Mire, MD 09/16/2014, 1:04 PM

## 2014-09-17 DIAGNOSIS — K5900 Constipation, unspecified: Secondary | ICD-10-CM

## 2014-09-17 LAB — CBC
HEMATOCRIT: 24.8 % — AB (ref 36.0–46.0)
HEMOGLOBIN: 8.2 g/dL — AB (ref 12.0–15.0)
MCH: 33.6 pg (ref 26.0–34.0)
MCHC: 33.1 g/dL (ref 30.0–36.0)
MCV: 101.6 fL — AB (ref 78.0–100.0)
Platelets: 188 10*3/uL (ref 150–400)
RBC: 2.44 MIL/uL — AB (ref 3.87–5.11)
RDW: 14.3 % (ref 11.5–15.5)
WBC: 8.9 10*3/uL (ref 4.0–10.5)

## 2014-09-17 LAB — BASIC METABOLIC PANEL
Anion gap: 8 (ref 5–15)
BUN: 35 mg/dL — ABNORMAL HIGH (ref 6–23)
CALCIUM: 8.7 mg/dL (ref 8.4–10.5)
CO2: 22 mmol/L (ref 19–32)
Chloride: 110 mmol/L (ref 96–112)
Creatinine, Ser: 2.05 mg/dL — ABNORMAL HIGH (ref 0.50–1.10)
GFR, EST AFRICAN AMERICAN: 24 mL/min — AB (ref >90)
GFR, EST NON AFRICAN AMERICAN: 20 mL/min — AB (ref >90)
Glucose, Bld: 129 mg/dL — ABNORMAL HIGH (ref 70–99)
POTASSIUM: 4.7 mmol/L (ref 3.5–5.1)
Sodium: 140 mmol/L (ref 135–145)

## 2014-09-17 LAB — GLUCOSE, CAPILLARY: Glucose-Capillary: 118 mg/dL — ABNORMAL HIGH (ref 70–99)

## 2014-09-17 LAB — CK: Total CK: 58 U/L (ref 7–177)

## 2014-09-17 MED ORDER — SODIUM CHLORIDE 0.9 % IV SOLN: INTRAVENOUS | Status: AC

## 2014-09-17 NOTE — Progress Notes (Signed)
Subjective:  Pt seen and examined in AM. No acute events overnight. She reports she is eating and drinking. She has mild left leg pain that is well controlled. She was able to tolerate more with PT yesterday. She continues to have mild abdominal pain and dysuria.         Objective: Vital signs in last 24 hours: Filed Vitals:   09/17/14 0000 09/17/14 0400 09/17/14 0448 09/17/14 0457  BP:   117/42   Pulse:   80   Temp:   98.3 F (36.8 C)   TempSrc:   Oral   Resp: 14 12    Height:      Weight:    242 lb 8 oz (109.997 kg)  SpO2: 98% 99% 97%    Weight change:   Intake/Output Summary (Last 24 hours) at 09/17/14 1053 Last data filed at 09/17/14 0800  Gross per 24 hour  Intake    600 ml  Output      0 ml  Net    600 ml    Physical Exam: General: NAD Heart: Normal rate and rhythm  Lungs: Anterior fields clear to auscultation  Abdomen: Soft, mild diffuse tenderness, non-distended, normal BS.  Extremities: No left femoral tenderness. Able to lift left leg about 20 deg Neuro: A & O x 3  Lab Results: Basic Metabolic Panel:  Recent Labs Lab 09/11/14 1735  09/13/14 0715  09/16/14 0420 09/17/14 0641  NA  --   < > 140  < > 139 140  K  --   < > 4.4  < > 4.5 4.7  CL  --   < > 114*  < > 112 110  CO2  --   < > 23  < > 23 22  GLUCOSE  --   < > 151*  < > 148* 129*  BUN  --   < > 17  < > 31* 35*  CREATININE  --   < > 1.42*  < > 1.97* 2.05*  CALCIUM  --   < > 7.7*  < > 8.5 8.7  MG 1.5  --  2.0  --   --   --   PHOS 1.7*  --   --   --   --   --   < > = values in this interval not displayed. Liver Function Tests:  Recent Labs Lab 09/11/14 1735  AST 26  ALT 22  ALKPHOS 51  BILITOT 0.5  PROT 5.2*  ALBUMIN 2.2*   CBC:  Recent Labs Lab 09/11/14 1130  09/14/14 0950  09/16/14 1352 09/17/14 0641  WBC 9.3  < > 8.5  < > 7.8 8.9  NEUTROABS 8.1*  --  5.3  --   --   --   HGB 11.2*  < > 7.1*  < > 8.4* 8.2*  HCT 34.2*  < > 22.1*  < > 26.0* 24.8*  MCV 104.0*  < >  105.7*  < > 101.2* 101.6*  PLT 187  < > 120*  < > 174 188  < > = values in this interval not displayed. Cardiac Enzymes:  Recent Labs Lab 09/11/14 1130 09/12/14 0511 09/17/14 0641  CKTOTAL 214* 619* 58  TROPONINI <0.03  --   --    CBG:  Recent Labs Lab 09/12/14 1849 09/12/14 2141 09/13/14 1208 09/13/14 1648 09/16/14 2144 09/17/14 0629  GLUCAP 143* 142* 201* 179* 134* 118*   Hemoglobin A1C:  Recent Labs Lab 09/11/14 1735  HGBA1C  5.6   Thyroid Function Tests:  Recent Labs Lab 09/11/14 1735  TSH 0.406   Coagulation:  Recent Labs Lab 09/11/14 1130  LABPROT 14.9  INR 1.16   Anemia Panel:  Recent Labs Lab 09/11/14 1735  VITAMINB12 1934*  FOLATE >20.0  FERRITIN 90  TIBC 285  IRON 33*  RETICCTPCT 2.3   Urinalysis:  Recent Labs Lab 09/11/14 1147  COLORURINE ORANGE*  LABSPEC 1.020  PHURINE 5.0  GLUCOSEU NEGATIVE  HGBUR MODERATE*  BILIRUBINUR NEGATIVE  KETONESUR NEGATIVE  PROTEINUR 100*  UROBILINOGEN 1.0  NITRITE POSITIVE*  LEUKOCYTESUR LARGE*    Micro Results: Recent Results (from the past 240 hour(s))  Surgical pcr screen     Status: None   Collection Time: 09/12/14  8:26 AM  Result Value Ref Range Status   MRSA, PCR NEGATIVE NEGATIVE Final   Staphylococcus aureus NEGATIVE NEGATIVE Final    Comment:        The Xpert SA Assay (FDA approved for NASAL specimens in patients over 15 years of age), is one component of a comprehensive surveillance program.  Test performance has been validated by [facility] for patients greater than or equal to 77 year old. It is not intended to diagnose infection nor to guide or monitor treatment.   Urine culture     Status: None   Collection Time: 09/12/14  1:40 PM  Result Value Ref Range Status   Specimen Description URINE, RANDOM  Final   Special Requests NONE  Final   Colony Count NO GROWTH Performed at [facility]   Final   Culture NO GROWTH Performed at [facility]    Final   Report Status 09/13/2014 FINAL  Final  Urine culture     Status: None   Collection Time: 09/14/14 12:49 PM  Result Value Ref Range Status   Specimen Description URINE, RANDOM  Final   Special Requests NONE  Final   Colony Count NO GROWTH Performed at [facility]   Final   Culture NO GROWTH Performed at [facility]   Final   Report Status 09/15/2014 FINAL  Final   Studies/Results: US Abdomen Complete  09/15/2014   CLINICAL DATA:  Generalized abdominal pain. Prior cholecystectomy. Chronic kidney disease stage 3.  EXAM: ULTRASOUND ABDOMEN COMPLETE  COMPARISON:  None.  FINDINGS: Gallbladder: Surgically absent.  Common bile duct: Diameter: 6 mm, within normal limits.  Liver: No focal lesion identified. Within normal limits in parenchymal echogenicity.  IVC: No abnormality visualized.  Pancreas: Visualized portion unremarkable.  Spleen: Size and appearance within normal limits.  Right Kidney: Length: 10.7 cm. Echogenicity within normal limits. Simple appearing cyst in the lower pole measuring 4.6 cm in maximum diameter. No mass or hydronephrosis visualized.  Left Kidney: Length: 11.1 cm. Echogenicity within normal limits. No mass or hydronephrosis visualized.  Abdominal aorta: No aneurysm visualized.  Other findings: None.  IMPRESSION: Prior cholecystectomy. No evidence of biliary ductal dilatation or other acute findings.  4.6 cm right renal cyst incidentally noted. No evidence hydronephrosis.   Electronically Signed   By: Earle Gell M.D.   On: 09/15/2014 19:30   Medications: I have reviewed the patient's current medications. Scheduled Meds: . aspirin EC  325 mg Oral BID  . calcium carbonate  1 tablet Oral Q breakfast  . cholecalciferol  1,000 Units Oral Daily  . darifenacin  7.5 mg Oral Daily  . docusate sodium  100 mg Oral BID  . escitalopram  20 mg Oral Daily  .  feeding supplement (ENSURE ENLIVE)  237 mL Oral BID BM  . levothyroxine  25 mcg Oral QAC breakfast  .  mirtazapine  15 mg Oral Daily  . multivitamin with minerals  1 tablet Oral Daily  . pantoprazole  40 mg Oral Daily  . polyethylene glycol  17 g Oral Daily   Continuous Infusions: . sodium chloride     PRN Meds:.acetaminophen **OR** acetaminophen, albuterol, ALPRAZolam, alum & mag hydroxide-simeth, menthol-cetylpyridinium **OR** phenol, methocarbamol **OR** methocarbamol (ROBAXIN)  IV, metoprolol, ondansetron **OR** ondansetron (ZOFRAN) IV, oxyCODONE Assessment/Plan: Principal Problem:   Fracture, proximal femur Active Problems:   Hypothyroidism   T2DM (type 2 diabetes mellitus)   Anemia   HYPERTENSION   Fall   UTI (lower urinary tract infection)   Chronic kidney disease (CKD), stage III (moderate)   Femur fracture, left   Opacity of lung on imaging study   Obesity   Fever   Compression fracture of body of thoracic vertebra   Abnormal chest x-ray   Acute blood loss anemia   Macrocytic anemia   Osteoporosis   Irregular heart beats   Non-oliguric AKI on CKD Stage 3 - Cr 2.05 from 1.97 yesterday in setting of recent IV contrast on 4/6 below baselne 1.5 per PCP. Prior calculated FeNa was 1.2% suggesting intrinsic renal etiology. US abdomen with no hydronephrosis. -Continue NS 75 mL/hr for 24 hrs  -Monitor daily weights and strict I/O's  -Continue to monitor BMP  -Avoid nephrotoxins, hold home losartan 75 mg daily -Recheck CK level ----> normalized   Acute on Chronic Constipation - Pt reports no recent BM. She is s/p surgery and on narcotic therapy. Pt at home colace BID.  -Administer water-based enema -Continue colace 100 mg BID and miralax daily due to constipation   Acute Nondisplaced Spiral Left Femoral Diaphysis Fracture s/p closed reduction and intramedullary nailing on 09/12/14 in setting of osteoporosis - Pt with well-controlled pain.  -Appreciate orthopedics recommendations  -Touch down weight bearing  -Awaiting SNF placement, not currently medical stable    -Oxycodone IR  5-10 Q 6hr PRN pain and robaxin 500 mg Q 6 hr PRN muscle spasms  -Continue newly started oscal 1250 mg daily and home vitamin D 1000 U daily  -To consider bisphosphate therapy and DEXA scan as outpatient  -Appreciate dietician consult, continue ensure BID in setting of prealbumin 16 -Continue aspirin 325 BID for 4 weeks for DVT prophylaxis per orthopedics. Pt also on SCD's.   Acute on Chronic Macrocytic Anemia - Hg 8.2  s/p 1 pRBC on 4/6/ below baseline 11 in setting of acute blood loss from surgery (200 mL) with no active bleeding.Pt at home on PO B12 supplements. Anemia panel with elevated B12 (1934). Smear review unremarkable. Possible in setting of hypothyroidism (however controlled) vs multiple myeloma (labs pending) vs myelodysplasia (however other counts stable). -Discontinue home B12 500 mcg daily due to elevated B12  -Pt needs outpatient hematology in setting of polyclonal gammopathy  -Follow-up FOBT -Continue to monitor CBC and transfuse if Hg <7  Complicated UTI -  Resolved s/p 5 days of antibiotic therapy (ceftriaxone and levaquin). Urinary cultures with no growth. Pt with chronic urinary incontinence. -Continue home darifenacin 7.5 mg daily for chronic incontinence  -Discontinue home pyridium 200 mg PRN pain  Hypothyroidism - Pt at home on synthroid 25 mcg daily. TSH level on 09/11/14 normal. Pt is s/p thyroidectomy.  -Continue home synthroid 25 mcg daily   Hypertension - Currently normotensive. Pt at home on losartan.  -Hold home  losartan 75 mg daily in setting of AKI -IV lopressor 2.5 mg PRN SBP>180  Depression and Anxiety - Currently with stable mood.  -Continue home ambien 5 mg PRN  -Continue home mirtazapine 15 mg daily  -Continue home xanax 0.25 mg TID PRN -Continue home lexapro 20 mg daily   Non-insulin dependent Type 2 DM - Blood gluocse 129 this AM. A1c 5.6 on diet control.  -Continue to monitor blood sugars -Consider SSI if continues to  be elevated  Mild CAD - Pt with intermittent chest pain recently worked up in January thought to be non-cardiac in nature. Pt at home on aspirin and losartan. Cardiac catheterization in 2003 showed mild CAD and normal systolic EF. Recent stress nuclear study in August 2014 was normal. -Continue aspirin 325 BID for 4 week for DVT prophylaxis then 81 mg daily    -Hold home losartan 75 mg daily in setting of AKI -Unclear why pt is not on BB and statin at home   GERD - Currently with no acid reflux symptoms -Continue home protonix 40 mg daily  HIV screening - Negative   Diet: Regular DVT Ppx: ASA 325 mg BID, SCD's Code: Full    Dispo: SNF Disposition is deferred at this time, awaiting improvement of current medical problems.  Anticipated discharge in approximately 1-3 day(s).   The patient does have a current PCP Janifer Adie, MD) and does need an [facility] hospital follow-up appointment after discharge.  The patient does have transportation limitations that hinder transportation to clinic appointments.  .Services Needed at time of discharge: Y = Yes, Blank = No PT:  Yes at SNF  OT:  Yes at SNF  RN:  No  Equipment:  Hospital bed   Other:     LOS: 6 days   Juluis Mire, MD 09/17/2014, 10:53 AM

## 2014-09-17 NOTE — Discharge Summary (Deleted)
  Discharge to home in stable condition. Closed treatment tibial plateau fracture. Follow-up with Dr. Erlinda Hong in 2 weeks.

## 2014-09-17 NOTE — Progress Notes (Addendum)
CSW spoke with MD and per MD Pt not ready for d/c until Monday.   CSW will give information to weekday CSW for follow-up and d/c planning.   CSW updated facility of Pt d/c status.    [facility]  [phone]

## 2014-09-17 NOTE — Progress Notes (Signed)
CSW spoke with Suanne Marker at [facility] and they are able to receive the Pt today.   CSW to follow for d/c planning.     [facility]  [phone] .

## 2014-09-18 LAB — RENAL FUNCTION PANEL
Albumin: 2.2 g/dL — ABNORMAL LOW (ref 3.5–5.2)
Anion gap: 10 (ref 5–15)
BUN: 34 mg/dL — AB (ref 6–23)
CO2: 18 mmol/L — ABNORMAL LOW (ref 19–32)
Calcium: 8.7 mg/dL (ref 8.4–10.5)
Chloride: 110 mmol/L (ref 96–112)
Creatinine, Ser: 1.75 mg/dL — ABNORMAL HIGH (ref 0.50–1.10)
GFR calc Af Amer: 29 mL/min — ABNORMAL LOW (ref >90)
GFR calc non Af Amer: 25 mL/min — ABNORMAL LOW (ref >90)
Glucose, Bld: 131 mg/dL — ABNORMAL HIGH (ref 70–99)
PHOSPHORUS: 3.3 mg/dL (ref 2.3–4.6)
POTASSIUM: 5 mmol/L (ref 3.5–5.1)
Sodium: 138 mmol/L (ref 135–145)

## 2014-09-18 LAB — CBC
HEMATOCRIT: 27.3 % — AB (ref 36.0–46.0)
HEMOGLOBIN: 8.9 g/dL — AB (ref 12.0–15.0)
MCH: 32.6 pg (ref 26.0–34.0)
MCHC: 32.6 g/dL (ref 30.0–36.0)
MCV: 100 fL (ref 78.0–100.0)
Platelets: 216 10*3/uL (ref 150–400)
RBC: 2.73 MIL/uL — AB (ref 3.87–5.11)
RDW: 14 % (ref 11.5–15.5)
WBC: 9.1 10*3/uL (ref 4.0–10.5)

## 2014-09-18 MED ORDER — SODIUM CHLORIDE 0.9 % IV SOLN
INTRAVENOUS | Status: AC
Start: 2014-09-18 — End: 2014-09-19
  Administered 2014-09-18: 16:00:00 via INTRAVENOUS

## 2014-09-18 MED ORDER — SODIUM CHLORIDE 0.9 % IV SOLN: INTRAVENOUS | Status: DC

## 2014-09-18 NOTE — Progress Notes (Signed)
Patient refusing soap suds enema. States she does not want another enema.

## 2014-09-18 NOTE — Progress Notes (Signed)
Subjective:  Pt seen and examined in AM. No acute events overnight. She reports she is doing well and has no complaints. She still has not had a BM despite water-based enema. She was able to tolerate more with PT yesterday.          Objective: Vital signs in last 24 hours: Filed Vitals:   09/18/14 0000 09/18/14 0400 09/18/14 0554 09/18/14 1420  BP:   148/52 127/41  Pulse:   81 82  Temp:   98.7 F (37.1 C) 99.1 F (37.3 C)  TempSrc:   Oral Oral  Resp: '16 16 16 18  ' Height:      Weight:   238 lb 3.2 oz (108.047 kg)   SpO2: 98% 94% 91% 100%   Weight change: -4 lb 4.8 oz (-1.95 kg)  Intake/Output Summary (Last 24 hours) at 09/18/14 1516 Last data filed at 09/18/14 1300  Gross per 24 hour  Intake    480 ml  Output      0 ml  Net    480 ml    Physical Exam: General: NAD, sitting up in chair eating breakfast Heart: Normal rate and rhythm  Lungs: Anterior fields clear to auscultation  Abdomen: Soft, mild diffuse tenderness, non-distended, normal BS.  Extremities: No left femoral tenderness. Neuro: A & O x 3  Lab Results: Basic Metabolic Panel:  Recent Labs Lab 09/11/14 1735  09/13/14 0715  09/17/14 0641 09/18/14 0808  NA  --   < > 140  < > 140 138  K  --   < > 4.4  < > 4.7 5.0  CL  --   < > 114*  < > 110 110  CO2  --   < > 23  < > 22 18*  GLUCOSE  --   < > 151*  < > 129* 131*  BUN  --   < > 17  < > 35* 34*  CREATININE  --   < > 1.42*  < > 2.05* 1.75*  CALCIUM  --   < > 7.7*  < > 8.7 8.7  MG 1.5  --  2.0  --   --   --   PHOS 1.7*  --   --   --   --  3.3  < > = values in this interval not displayed. Liver Function Tests:  Recent Labs Lab 09/11/14 1735 09/18/14 0808  AST 26  --   ALT 22  --   ALKPHOS 51  --   BILITOT 0.5  --   PROT 5.2*  --   ALBUMIN 2.2* 2.2*   CBC:  Recent Labs Lab 09/14/14 0950  09/17/14 0641 09/18/14 0808  WBC 8.5  < > 8.9 9.1  NEUTROABS 5.3  --   --   --   HGB 7.1*  < > 8.2* 8.9*  HCT 22.1*  < > 24.8* 27.3*  MCV  105.7*  < > 101.6* 100.0  PLT 120*  < > 188 216  < > = values in this interval not displayed. Cardiac Enzymes:  Recent Labs Lab 09/12/14 0511 09/17/14 0641  CKTOTAL 619* 58   CBG:  Recent Labs Lab 09/12/14 1849 09/12/14 2141 09/13/14 1208 09/13/14 1648 09/16/14 2144 09/17/14 0629  GLUCAP 143* 142* 201* 179* 134* 118*   Hemoglobin A1C:  Recent Labs Lab 09/11/14 1735  HGBA1C 5.6   Thyroid Function Tests:  Recent Labs Lab 09/11/14 1735  TSH 0.406   Coagulation: No results for input(s):  LABPROT, INR in the last 168 hours. Anemia Panel:  Recent Labs Lab 09/11/14 1735  VITAMINB12 1934*  FOLATE >20.0  FERRITIN 90  TIBC 285  IRON 33*  RETICCTPCT 2.3   Urinalysis: No results for input(s): COLORURINE, LABSPEC, PHURINE, GLUCOSEU, HGBUR, BILIRUBINUR, KETONESUR, PROTEINUR, UROBILINOGEN, NITRITE, LEUKOCYTESUR in the last 168 hours.  Invalid input(s): APPERANCEUR  Micro Results: Recent Results (from the past 240 hour(s))  Surgical pcr screen     Status: None   Collection Time: 09/12/14  8:26 AM  Result Value Ref Range Status   MRSA, PCR NEGATIVE NEGATIVE Final   Staphylococcus aureus NEGATIVE NEGATIVE Final    Comment:        The Xpert SA Assay (FDA approved for NASAL specimens in patients over 40 years of age), is one component of a comprehensive surveillance program.  Test performance has been validated by [facility] for patients greater than or equal to 44 year old. It is not intended to diagnose infection nor to guide or monitor treatment.   Urine culture     Status: None   Collection Time: 09/12/14  1:40 PM  Result Value Ref Range Status   Specimen Description URINE, RANDOM  Final   Special Requests NONE  Final   Colony Count NO GROWTH Performed at [facility]   Final   Culture NO GROWTH Performed at [facility]   Final   Report Status 09/13/2014 FINAL  Final  Urine culture     Status: None   Collection Time: 09/14/14  12:49 PM  Result Value Ref Range Status   Specimen Description URINE, RANDOM  Final   Special Requests NONE  Final   Colony Count NO GROWTH Performed at [facility]   Final   Culture NO GROWTH Performed at [facility]   Final   Report Status 09/15/2014 FINAL  Final   Studies/Results: No results found. Medications: I have reviewed the patient's current medications. Scheduled Meds: . aspirin EC  325 mg Oral BID  . calcium carbonate  1 tablet Oral Q breakfast  . cholecalciferol  1,000 Units Oral Daily  . darifenacin  7.5 mg Oral Daily  . docusate sodium  100 mg Oral BID  . escitalopram  20 mg Oral Daily  . feeding supplement (ENSURE ENLIVE)  237 mL Oral BID BM  . levothyroxine  25 mcg Oral QAC breakfast  . mirtazapine  15 mg Oral Daily  . multivitamin with minerals  1 tablet Oral Daily  . pantoprazole  40 mg Oral Daily  . polyethylene glycol  17 g Oral Daily   Continuous Infusions: . sodium chloride     PRN Meds:.acetaminophen **OR** acetaminophen, albuterol, ALPRAZolam, alum & mag hydroxide-simeth, menthol-cetylpyridinium **OR** phenol, methocarbamol **OR** methocarbamol (ROBAXIN)  IV, metoprolol, ondansetron **OR** ondansetron (ZOFRAN) IV, oxyCODONE Assessment/Plan: Principal Problem:   Fracture, proximal femur Active Problems:   Hypothyroidism   T2DM (type 2 diabetes mellitus)   Anemia   HYPERTENSION   Fall   UTI (lower urinary tract infection)   Chronic kidney disease (CKD), stage III (moderate)   Femur fracture, left   Opacity of lung on imaging study   Obesity   Fever   Compression fracture of body of thoracic vertebra   Abnormal chest x-ray   Acute blood loss anemia   Macrocytic anemia   Osteoporosis   Irregular heart beats   Non-oliguric AKI on CKD Stage 3 - Cr 1.75 from 2.05  yesterday in setting of recent IV contrast on  4/6 above baseline 1.5 per PCP. Prior calculated FeNa was 1.2% suggesting intrinsic renal etiology. US abdomen with no  hydronephrosis. -Continue NS 75 mL/hr today  -Monitor daily weights and strict I/O's  -Continue to monitor BMP  -Avoid nephrotoxins, hold home losartan 75 mg daily   Acute on Chronic Constipation - Pt reports no recent BM despite water based enema. She is s/p surgery and on narcotic therapy. Pt at home colace BID.  -Administer soap suds enema if agreeable -Continue colace 100 mg BID and miralax daily due to constipation   Acute Nondisplaced Spiral Left Femoral Diaphysis Fracture s/p closed reduction and intramedullary nailing on 09/12/14 in setting of osteoporosis - Pt with well-controlled pain.  -Appreciate orthopedics recommendations  -Touch down weight bearing  -Pt stable for SNF tomorrow  -Oxycodone IR  5-10 Q 6hr PRN pain and robaxin 500 mg Q 6 hr PRN muscle spasms  -Continue newly started oscal 1250 mg daily and home vitamin D 1000 U daily  -To consider bisphosphate therapy, intermittent recombinant human parathyroid hormone administration, and DEXA scan as outpatient  -Appreciate dietician consult, continue ensure BID in setting of prealbumin 16 -Continue aspirin 325 BID for 4 weeks for DVT prophylaxis per orthopedics. Pt also on SCD's.   Acute on Chronic Macrocytic Anemia - Hg 8.9  s/p 1 pRBC on 4/6 below baseline 11 in setting of acute blood loss from surgery (200 mL) with no active bleeding.Pt at home on PO B12 supplements. Anemia panel with elevated B12 (1934). Smear review unremarkable. Possible in setting of hypothyroidism (however controlled) vs multiple myeloma (labs pending) vs myelodysplasia (however other counts stable). -Discontinue home B12 500 mcg daily due to elevated B12  -Pt needs outpatient hematology in setting of polyclonal gammopathy  -Follow-up FOBT -Continue to monitor CBC and transfuse if Hg <7  Complicated UTI -  Resolved s/p 5 days of antibiotic therapy (ceftriaxone and levaquin). Urinary cultures with no growth. Pt with chronic urinary  incontinence. -Continue home darifenacin 7.5 mg daily for chronic incontinence  -Discontinue home pyridium 200 mg PRN pain  Hypothyroidism - Pt at home on synthroid 25 mcg daily. TSH level on 09/11/14 normal. Pt is s/p thyroidectomy.  -Continue home synthroid 25 mcg daily   Hypertension - Currently normotensive. Pt at home on losartan.  -Hold home losartan 75 mg daily in setting of AKI -IV lopressor 2.5 mg PRN SBP>180  Depression and Anxiety - Currently with stable mood.  -Continue home ambien 5 mg PRN  -Continue home mirtazapine 15 mg daily  -Continue home xanax 0.25 mg TID PRN -Continue home lexapro 20 mg daily   Non-insulin dependent Type 2 DM - Blood gluocse 131 this AM. A1c 5.6 on diet control.  -Continue to monitor blood sugars -Consider SSI if continues to be elevated  Mild CAD - Pt with intermittent chest pain recently worked up in January thought to be non-cardiac in nature. Pt at home on aspirin and losartan. Cardiac catheterization in 2003 showed mild CAD and normal systolic EF. Recent stress nuclear study in August 2014 was normal. -Continue aspirin 325 BID for 4 week for DVT prophylaxis then 81 mg daily    -Hold home losartan 75 mg daily in setting of AKI -Unclear why pt is not on BB and statin at home   GERD - Currently with no acid reflux symptoms -Continue home protonix 40 mg daily  HIV screening - Negative   Diet: Regular DVT Ppx: ASA 325 mg BID, SCD's Code: Full    Dispo:  SNF  Anticipated discharge in approximately 1 day(s).   The patient does have a current PCP Janifer Adie, MD) and does need an [facility] hospital follow-up appointment after discharge.  The patient does have transportation limitations that hinder transportation to clinic appointments.  .Services Needed at time of discharge: Y = Yes, Blank = No PT:  Yes at SNF  OT:  Yes at SNF  RN:  No  Equipment:  Hospital bed   Other:     LOS: 7 days   Juluis Mire, MD 09/18/2014, 3:16  PM

## 2014-09-19 LAB — BASIC METABOLIC PANEL
Anion gap: 4 — ABNORMAL LOW (ref 5–15)
BUN: 27 mg/dL — AB (ref 6–23)
CHLORIDE: 111 mmol/L (ref 96–112)
CO2: 25 mmol/L (ref 19–32)
CREATININE: 1.53 mg/dL — AB (ref 0.50–1.10)
Calcium: 8.4 mg/dL (ref 8.4–10.5)
GFR calc Af Amer: 34 mL/min — ABNORMAL LOW (ref >90)
GFR calc non Af Amer: 29 mL/min — ABNORMAL LOW (ref >90)
Glucose, Bld: 125 mg/dL — ABNORMAL HIGH (ref 70–99)
Potassium: 4.5 mmol/L (ref 3.5–5.1)
Sodium: 140 mmol/L (ref 135–145)

## 2014-09-19 LAB — CBC
HCT: 25.2 % — ABNORMAL LOW (ref 36.0–46.0)
Hemoglobin: 7.9 g/dL — ABNORMAL LOW (ref 12.0–15.0)
MCH: 31.9 pg (ref 26.0–34.0)
MCHC: 31.3 g/dL (ref 30.0–36.0)
MCV: 101.6 fL — ABNORMAL HIGH (ref 78.0–100.0)
PLATELETS: 239 10*3/uL (ref 150–400)
RBC: 2.48 MIL/uL — AB (ref 3.87–5.11)
RDW: 14 % (ref 11.5–15.5)
WBC: 8.7 10*3/uL (ref 4.0–10.5)

## 2014-09-19 MED ORDER — SENNOSIDES-DOCUSATE SODIUM 8.6-50 MG PO TABS: 1.0000 | ORAL_TABLET | Freq: Every day | ORAL | Status: DC

## 2014-09-19 MED ORDER — OXYCODONE HCL 5 MG PO TABS: 5.0000 mg | ORAL_TABLET | Freq: Two times a day (BID) | ORAL | Status: DC | PRN

## 2014-09-19 MED ORDER — ACETAMINOPHEN 325 MG PO TABS: 650.0000 mg | ORAL_TABLET | Freq: Four times a day (QID) | ORAL | Status: DC | PRN

## 2014-09-19 MED ORDER — CALCIUM CARBONATE 1250 (500 CA) MG PO TABS: 1.0000 | ORAL_TABLET | Freq: Every day | ORAL | Status: AC

## 2014-09-19 MED ORDER — ASPIRIN 325 MG PO TBEC
325.0000 mg | DELAYED_RELEASE_TABLET | Freq: Two times a day (BID) | ORAL | Status: AC
Start: 2014-09-19 — End: 2014-10-12

## 2014-09-19 MED ORDER — POLYETHYLENE GLYCOL 3350 17 G PO PACK: 17.0000 g | PACK | Freq: Every day | ORAL | Status: DC | PRN

## 2014-09-19 MED ORDER — ASPIRIN 81 MG PO CHEW: 81.0000 mg | CHEWABLE_TABLET | Freq: Every day | ORAL | Status: DC

## 2014-09-19 MED ORDER — ENSURE ENLIVE PO LIQD: 237.0000 mL | Freq: Two times a day (BID) | ORAL | Status: AC

## 2014-09-19 NOTE — Progress Notes (Signed)
Subjective:  Pt seen and examined in AM. No acute events overnight. She reports feeling well and ready to go to SNF today.    Objective: Vital signs in last 24 hours: Filed Vitals:   09/18/14 0554 09/18/14 1420 09/18/14 2020 09/19/14 0513  BP: 148/52 127/41 138/39 147/60  Pulse: 81 82 83 82  Temp: 98.7 F (37.1 C) 99.1 F (37.3 C) 99.1 F (37.3 C) 98.9 F (37.2 C)  TempSrc: Oral Oral Oral Oral  Resp: '16 18 18 18  ' Height:      Weight: 238 lb 3.2 oz (108.047 kg)   237 lb 9.6 oz (107.775 kg)  SpO2: 91% 100% 100% 97%   Weight change: -9.6 oz (-0.272 kg)  Intake/Output Summary (Last 24 hours) at 09/19/14 0726 Last data filed at 09/19/14 0214  Gross per 24 hour  Intake   1050 ml  Output      0 ml  Net   1050 ml    Physical Exam: General: NAD, sitting up in bed Heart: Normal rate and rhythm  Lungs: Anterior fields clear to auscultation  Abdomen: Soft, mild diffuse tenderness, non-distended, normal BS.  Extremities: No left femoral tenderness. Neuro: A & O x 3  Lab Results: Basic Metabolic Panel:  Recent Labs Lab 09/13/14 0715  09/17/14 0641 09/18/14 0808  NA 140  < > 140 138  K 4.4  < > 4.7 5.0  CL 114*  < > 110 110  CO2 23  < > 22 18*  GLUCOSE 151*  < > 129* 131*  BUN 17  < > 35* 34*  CREATININE 1.42*  < > 2.05* 1.75*  CALCIUM 7.7*  < > 8.7 8.7  MG 2.0  --   --   --   PHOS  --   --   --  3.3  < > = values in this interval not displayed. Liver Function Tests:  Recent Labs Lab 09/18/14 0808  ALBUMIN 2.2*   CBC:  Recent Labs Lab 09/14/14 0950  09/17/14 0641 09/18/14 0808  WBC 8.5  < > 8.9 9.1  NEUTROABS 5.3  --   --   --   HGB 7.1*  < > 8.2* 8.9*  HCT 22.1*  < > 24.8* 27.3*  MCV 105.7*  < > 101.6* 100.0  PLT 120*  < > 188 216  < > = values in this interval not displayed. Cardiac Enzymes:  Recent Labs Lab 09/17/14 0641  CKTOTAL 58   CBG:  Recent Labs Lab 09/12/14 1849 09/12/14 2141 09/13/14 1208 09/13/14 1648  09/16/14 2144 09/17/14 0629  GLUCAP 143* 142* 201* 179* 134* 118*    Micro Results: Recent Results (from the past 240 hour(s))  Surgical pcr screen     Status: None   Collection Time: 09/12/14  8:26 AM  Result Value Ref Range Status   MRSA, PCR NEGATIVE NEGATIVE Final   Staphylococcus aureus NEGATIVE NEGATIVE Final    Comment:        The Xpert SA Assay (FDA approved for NASAL specimens in patients over 107 years of age), is one component of a comprehensive surveillance program.  Test performance has been validated by [facility] for patients greater than or equal to 33 year old. It is not intended to diagnose infection nor to guide or monitor treatment.   Urine culture     Status: None   Collection Time: 09/12/14  1:40 PM  Result Value Ref Range Status   Specimen Description URINE, RANDOM  Final   Special Requests NONE  Final   Colony Count NO GROWTH Performed at [facility]   Final   Culture NO GROWTH Performed at [facility]   Final   Report Status 09/13/2014 FINAL  Final  Urine culture     Status: None   Collection Time: 09/14/14 12:49 PM  Result Value Ref Range Status   Specimen Description URINE, RANDOM  Final   Special Requests NONE  Final   Colony Count NO GROWTH Performed at [facility]   Final   Culture NO GROWTH Performed at [facility]   Final   Report Status 09/15/2014 FINAL  Final   Studies/Results: No results found. Medications: I have reviewed the patient's current medications. Scheduled Meds: . aspirin EC  325 mg Oral BID  . calcium carbonate  1 tablet Oral Q breakfast  . cholecalciferol  1,000 Units Oral Daily  . darifenacin  7.5 mg Oral Daily  . docusate sodium  100 mg Oral BID  . escitalopram  20 mg Oral Daily  . feeding supplement (ENSURE ENLIVE)  237 mL Oral BID BM  . levothyroxine  25 mcg Oral QAC breakfast  . mirtazapine  15 mg Oral Daily  . multivitamin with minerals  1 tablet Oral Daily  .  pantoprazole  40 mg Oral Daily  . polyethylene glycol  17 g Oral Daily   Continuous Infusions:   PRN Meds:.acetaminophen **OR** acetaminophen, albuterol, ALPRAZolam, alum & mag hydroxide-simeth, menthol-cetylpyridinium **OR** phenol, methocarbamol **OR** methocarbamol (ROBAXIN)  IV, metoprolol, ondansetron **OR** ondansetron (ZOFRAN) IV, oxyCODONE Assessment/Plan: Principal Problem:   Fracture, proximal femur Active Problems:   Hypothyroidism   T2DM (type 2 diabetes mellitus)   Anemia   HYPERTENSION   Fall   UTI (lower urinary tract infection)   Chronic kidney disease (CKD), stage III (moderate)   Femur fracture, left   Opacity of lung on imaging study   Obesity   Fever   Compression fracture of body of thoracic vertebra   Abnormal chest x-ray   Acute blood loss anemia   Macrocytic anemia   Osteoporosis   Irregular heart beats   Non-oliguric AKI on CKD Stage 3 -  Resolved. Cr improved to 1.53 (baseline) from 1.75 yesterday with IVF's . Pt had  recent IV contrast on 4/6. Prior calculated FeNa was 1.2% suggesting intrinsic renal etiology. US abdomen with no hydronephrosis. -Monitor daily weights and strict I/O's  -Avoid nephrotoxins, hold home losartan 75 mg daily   Acute on Chronic Constipation - Last BM on 4/9 s/p  water based enema. She is s/p surgery and on narcotic therapy. Pt at home colace BID.  -Continue colace 100 mg daily PRN and miralax daily PRN due to constipation   Acute Nondisplaced Spiral Left Femoral Diaphysis Fracture s/p closed reduction and intramedullary nailing on 09/12/14 in setting of osteoporosis - Pt with well-controlled pain.  -Appreciate orthopedics recommendations  -Touch down weight bearing  -Pt stable for SNF today  -Oxycodone IR  5-10 Q 6hr PRN pain  -Continue newly started oscal 1250 mg daily and home vitamin D 1000 U daily  -To consider bisphosphate therapy, intermittent recombinant human parathyroid hormone administration, and DEXA scan as  outpatient  -Appreciate dietician consult, continue ensure BID in setting of prealbumin 16 -Continue aspirin 325 BID for 4 weeks for DVT prophylaxis per orthopedics   Acute on Chronic Macrocytic Anemia - Hg 7.9 s/p 1 pRBC on 4/6 below baseline 11 in setting of acute blood loss from  surgery (200 mL) with no active bleeding.Pt at home on PO B12 supplements. Anemia panel with elevated B12 (1934). Smear review unremarkable. Possible in setting of hypothyroidism (however controlled) vs multiple myeloma (labs pending) vs myelodysplasia (however other counts stable). -Discontinue home B12 500 mcg daily due to elevated B12  -Pt set up for outpatient hematology in setting of polyclonal gammopathy  -Continue to monitor CBC and transfuse if Hg <7  Complicated UTI - Still with urinary symptoms despite  5 days of antibiotic therapy (ceftriaxone and levaquin). Urinary cultures with no growth. Pt with chronic urinary incontinence. -Continue home darifenacin 7.5 mg daily for chronic incontinence  -Discontinue home pyridium 200 mg PRN pain -Pt needs outpatient urology follow-up  Hypothyroidism - Pt at home on synthroid 25 mcg daily. TSH level on 09/11/14 normal. Pt is s/p thyroidectomy.  -Continue home synthroid 25 mcg daily   Hypertension - Currently normotensive. Pt at home on losartan.  -Hold home losartan 75 mg daily in setting of AKI -IV lopressor 2.5 mg PRN SBP>180  Depression and Anxiety - Currently with stable mood.  -Continue home ambien 5 mg PRN  -Continue home mirtazapine 15 mg daily  -Continue home xanax 0.25 mg TID PRN -Continue home lexapro 20 mg daily   Non-insulin dependent Type 2 DM - Blood gluocse 125 this AM. A1c 5.6 on diet control.  -Continue to monitor blood sugars -Consider SSI if continues to be elevated  Mild CAD - Pt with intermittent chest pain recently worked up in January thought to be non-cardiac in nature. Pt at home on aspirin and losartan. Cardiac catheterization  in 2003 showed mild CAD and normal systolic EF. Recent stress nuclear study in August 2014 was normal. -Continue aspirin 325 BID for 4 week for DVT prophylaxis then 81 mg daily    -Hold home losartan 75 mg daily in setting of AKI -Unclear why pt is not on BB and statin at home   GERD - Currently with no acid reflux symptoms -Continue home protonix 40 mg daily  HIV screening - Negative   Diet: Regular DVT Ppx: ASA 325 mg BID, SCD's Code: Full    Dispo: SNF  Anticipated discharge in approximately 1 day(s).   The patient does have a current PCP Janifer Adie, MD) and does need an [facility] hospital follow-up appointment after discharge.  The patient does have transportation limitations that hinder transportation to clinic appointments.  .Services Needed at time of discharge: Y = Yes, Blank = No PT:  Yes at SNF  OT:  Yes at SNF  RN:  No  Equipment:  Hospital bed   Other:     LOS: 8 days   Juluis Mire, MD 09/19/2014, 7:26 AM

## 2014-09-19 NOTE — Discharge Planning (Signed)
Patient to be discharged to [facility]. Patient updated at bedside. [facility] CSW updated via phone.  Facility: [facility] Report number: [phone] Transportation: EMS ([facility])  Lubertha Sayres, [place] Cell: [phone]       Fax: [phone] Clinical Social Work: Orthopedics [phone]) and Surgical [phone])

## 2014-09-19 NOTE — Progress Notes (Signed)
Physical Therapy Treatment Patient Details Name: JUSTIN BROWNFIELD MRN: [id number] DOB: Aug 20, 1923 Today's Date: 09/19/2014    History of Present Illness 81 y.o. female admitted to [facility] on 09/11/14 s/p fall with left femur fx.  She is now s/p IM nail and is TDWB.  She has significant PMHx of lumbar leminectomy.  No other history listed in chart.    PT Comments    Pt is progressing toward goals, although progress is slow due to SOB and fatigue. She was able to maintain sitting balance for >5 min with supervision. Pt demonstrated 3/4 on dyspnea scale. Pt would benefit from continued PT to increase functional independence.   Follow Up Recommendations  SNF     Equipment Recommendations  Hospital bed;Other (comment)    Recommendations for Other Services       Precautions / Restrictions Precautions Precautions: Fall Restrictions LLE Weight Bearing: Touchdown weight bearing    Mobility  Bed Mobility Overal bed mobility: Needs Assistance;+2 for physical assistance Bed Mobility: Supine to Sit;Sit to Supine     Supine to sit: Mod assist;+2 for physical assistance Sit to supine: Mod assist;+2 for physical assistance   General bed mobility comments: Mod A +2 for LE and trunk support during transition to sitting and back to supine. Pt able to assist some with UEs.  Transfers                    Ambulation/Gait                 Stairs            Wheelchair Mobility    Modified Rankin (Stroke Patients Only)       Balance Overall balance assessment: Needs assistance Sitting-balance support: Bilateral upper extremity supported Sitting balance-Leahy Scale: Fair Sitting balance - Comments: Pt able to sit with B UE support. Tends to lean on R side to decrease weight on L hip.                            Cognition Arousal/Alertness: Awake/alert Behavior During Therapy: WFL for tasks assessed/performed Overall Cognitive Status: No family/caregiver  present to determine baseline cognitive functioning                      Exercises General Exercises - Lower Extremity Long Arc Quad: AAROM;Left;10 reps    General Comments        Pertinent Vitals/Pain Pain Assessment: Faces Faces Pain Scale: Hurts little more Pain Location: L leg Pain Descriptors / Indicators: Aching;Grimacing Pain Intervention(s): Limited activity within patient's tolerance;Monitored during session;Repositioned    Home Living                      Prior Function            PT Goals (current goals can now be found in the care plan section) Progress towards PT goals: Progressing toward goals    Frequency  Min 3X/week    PT Plan Current plan remains appropriate    Co-evaluation             End of Session   Activity Tolerance: Patient limited by fatigue Patient left: in bed;with call bell/phone within reach     Time: 1418-1446 PT Time Calculation (min) (ACUTE ONLY): 28 min  Charges:  $Therapeutic Activity: 23-37 mins  G CodesRubye Oaks, [place] 09/19/2014, 3:09 PM

## 2016-10-08 ENCOUNTER — Ambulatory Visit
Admission: RE | Admit: 2016-10-08 | Discharge: 2016-10-08 | Disposition: A | Discharge status: Home / Self Care | Payer: Medicare (Managed Care) | Source: Ambulatory Visit | Attending: Family Medicine | Admitting: Family Medicine

## 2016-10-08 ENCOUNTER — Other Ambulatory Visit: Payer: Self-pay | Admitting: Family Medicine

## 2016-10-08 DIAGNOSIS — T1490XA Injury, unspecified, initial encounter: Secondary | ICD-10-CM

## 2016-11-05 ENCOUNTER — Ambulatory Visit
Admission: RE | Admit: 2016-11-05 | Discharge: 2016-11-05 | Disposition: A | Discharge status: Home / Self Care | Payer: Medicare (Managed Care) | Source: Ambulatory Visit | Attending: Family Medicine | Admitting: Family Medicine

## 2016-11-05 ENCOUNTER — Other Ambulatory Visit: Payer: Self-pay | Admitting: Family Medicine

## 2016-11-05 DIAGNOSIS — J189 Pneumonia, unspecified organism: Secondary | ICD-10-CM

## 2016-12-18 ENCOUNTER — Emergency Department (HOSPITAL_COMMUNITY): Payer: Medicare (Managed Care)

## 2016-12-18 ENCOUNTER — Inpatient Hospital Stay (HOSPITAL_COMMUNITY)
Admission: EM | Admit: 2016-12-18 | Discharge: 2016-12-21 | DRG: 493 | Disposition: A | Discharge status: Skilled Nursing Facility | Payer: Medicare (Managed Care) | Attending: Family Medicine | Admitting: Family Medicine

## 2016-12-18 ENCOUNTER — Inpatient Hospital Stay (HOSPITAL_COMMUNITY): Payer: Medicare (Managed Care)

## 2016-12-18 ENCOUNTER — Encounter (HOSPITAL_COMMUNITY): Payer: Self-pay

## 2016-12-18 DIAGNOSIS — K219 Gastro-esophageal reflux disease without esophagitis: Secondary | ICD-10-CM | POA: Diagnosis present

## 2016-12-18 DIAGNOSIS — Z79899 Other long term (current) drug therapy: Secondary | ICD-10-CM

## 2016-12-18 DIAGNOSIS — E039 Hypothyroidism, unspecified: Secondary | ICD-10-CM | POA: Diagnosis present

## 2016-12-18 DIAGNOSIS — E1122 Type 2 diabetes mellitus with diabetic chronic kidney disease: Secondary | ICD-10-CM | POA: Diagnosis present

## 2016-12-18 DIAGNOSIS — Z66 Do not resuscitate: Secondary | ICD-10-CM | POA: Diagnosis present

## 2016-12-18 DIAGNOSIS — I672 Cerebral atherosclerosis: Secondary | ICD-10-CM | POA: Diagnosis present

## 2016-12-18 DIAGNOSIS — Z87891 Personal history of nicotine dependence: Secondary | ICD-10-CM | POA: Diagnosis not present

## 2016-12-18 DIAGNOSIS — D539 Nutritional anemia, unspecified: Secondary | ICD-10-CM | POA: Diagnosis present

## 2016-12-18 DIAGNOSIS — S82892A Other fracture of left lower leg, initial encounter for closed fracture: Secondary | ICD-10-CM

## 2016-12-18 DIAGNOSIS — N183 Chronic kidney disease, stage 3 (moderate): Secondary | ICD-10-CM | POA: Diagnosis present

## 2016-12-18 DIAGNOSIS — F0390 Unspecified dementia without behavioral disturbance: Secondary | ICD-10-CM | POA: Diagnosis present

## 2016-12-18 DIAGNOSIS — F15982 Other stimulant use, unspecified with stimulant-induced sleep disorder: Secondary | ICD-10-CM | POA: Diagnosis not present

## 2016-12-18 DIAGNOSIS — W010XXA Fall on same level from slipping, tripping and stumbling without subsequent striking against object, initial encounter: Secondary | ICD-10-CM | POA: Diagnosis present

## 2016-12-18 DIAGNOSIS — I2510 Atherosclerotic heart disease of native coronary artery without angina pectoris: Secondary | ICD-10-CM | POA: Diagnosis present

## 2016-12-18 DIAGNOSIS — E785 Hyperlipidemia, unspecified: Secondary | ICD-10-CM | POA: Diagnosis present

## 2016-12-18 DIAGNOSIS — S82832A Other fracture of upper and lower end of left fibula, initial encounter for closed fracture: Secondary | ICD-10-CM | POA: Diagnosis present

## 2016-12-18 DIAGNOSIS — I129 Hypertensive chronic kidney disease with stage 1 through stage 4 chronic kidney disease, or unspecified chronic kidney disease: Secondary | ICD-10-CM | POA: Diagnosis present

## 2016-12-18 DIAGNOSIS — T148XXA Other injury of unspecified body region, initial encounter: Secondary | ICD-10-CM

## 2016-12-18 DIAGNOSIS — F418 Other specified anxiety disorders: Secondary | ICD-10-CM | POA: Diagnosis present

## 2016-12-18 DIAGNOSIS — S82102A Unspecified fracture of upper end of left tibia, initial encounter for closed fracture: Principal | ICD-10-CM | POA: Diagnosis present

## 2016-12-18 DIAGNOSIS — S82402A Unspecified fracture of shaft of left fibula, initial encounter for closed fracture: Secondary | ICD-10-CM | POA: Diagnosis not present

## 2016-12-18 DIAGNOSIS — D62 Acute posthemorrhagic anemia: Secondary | ICD-10-CM | POA: Diagnosis not present

## 2016-12-18 DIAGNOSIS — I10 Essential (primary) hypertension: Secondary | ICD-10-CM | POA: Diagnosis not present

## 2016-12-18 DIAGNOSIS — S82202A Unspecified fracture of shaft of left tibia, initial encounter for closed fracture: Secondary | ICD-10-CM | POA: Diagnosis present

## 2016-12-18 DIAGNOSIS — W19XXXA Unspecified fall, initial encounter: Secondary | ICD-10-CM | POA: Diagnosis present

## 2016-12-18 DIAGNOSIS — E119 Type 2 diabetes mellitus without complications: Secondary | ICD-10-CM

## 2016-12-18 DIAGNOSIS — Z9889 Other specified postprocedural states: Secondary | ICD-10-CM

## 2016-12-18 DIAGNOSIS — Z419 Encounter for procedure for purposes other than remedying health state, unspecified: Secondary | ICD-10-CM

## 2016-12-18 DIAGNOSIS — M1990 Unspecified osteoarthritis, unspecified site: Secondary | ICD-10-CM | POA: Diagnosis present

## 2016-12-18 DIAGNOSIS — N184 Chronic kidney disease, stage 4 (severe): Secondary | ICD-10-CM | POA: Diagnosis present

## 2016-12-18 HISTORY — DX: Other specified anxiety disorders: F41.8

## 2016-12-18 HISTORY — DX: Gastro-esophageal reflux disease without esophagitis: K21.9

## 2016-12-18 HISTORY — DX: Hypothyroidism, unspecified: E03.9

## 2016-12-18 LAB — CBC WITH DIFFERENTIAL/PLATELET
Basophils Absolute: 0 10*3/uL (ref 0.0–0.1)
Basophils Relative: 0 %
EOS PCT: 3 %
Eosinophils Absolute: 0.2 10*3/uL (ref 0.0–0.7)
HCT: 30.1 % — ABNORMAL LOW (ref 36.0–46.0)
Hemoglobin: 10.1 g/dL — ABNORMAL LOW (ref 12.0–15.0)
LYMPHS ABS: 1.8 10*3/uL (ref 0.7–4.0)
LYMPHS PCT: 26 %
MCH: 33.2 pg (ref 26.0–34.0)
MCHC: 33.6 g/dL (ref 30.0–36.0)
MCV: 99 fL (ref 78.0–100.0)
MONO ABS: 0.6 10*3/uL (ref 0.1–1.0)
Monocytes Relative: 8 %
Neutro Abs: 4.2 10*3/uL (ref 1.7–7.7)
Neutrophils Relative %: 63 %
PLATELETS: 185 10*3/uL (ref 150–400)
RBC: 3.04 MIL/uL — ABNORMAL LOW (ref 3.87–5.11)
RDW: 12.2 % (ref 11.5–15.5)
WBC: 6.8 10*3/uL (ref 4.0–10.5)

## 2016-12-18 LAB — APTT: aPTT: 27 seconds (ref 24–36)

## 2016-12-18 LAB — COMPREHENSIVE METABOLIC PANEL
ALT: 10 U/L — AB (ref 14–54)
AST: 17 U/L (ref 15–41)
Albumin: 3.1 g/dL — ABNORMAL LOW (ref 3.5–5.0)
Alkaline Phosphatase: 79 U/L (ref 38–126)
Anion gap: 7 (ref 5–15)
BUN: 21 mg/dL — ABNORMAL HIGH (ref 6–20)
CHLORIDE: 107 mmol/L (ref 101–111)
CO2: 26 mmol/L (ref 22–32)
CREATININE: 1.47 mg/dL — AB (ref 0.44–1.00)
Calcium: 8.8 mg/dL — ABNORMAL LOW (ref 8.9–10.3)
GFR, EST AFRICAN AMERICAN: 35 mL/min — AB (ref >60)
GFR, EST NON AFRICAN AMERICAN: 30 mL/min — AB (ref >60)
Glucose, Bld: 103 mg/dL — ABNORMAL HIGH (ref 65–99)
POTASSIUM: 4.4 mmol/L (ref 3.5–5.1)
Sodium: 140 mmol/L (ref 135–145)
TOTAL PROTEIN: 6.3 g/dL — AB (ref 6.5–8.1)
Total Bilirubin: 0.7 mg/dL (ref 0.3–1.2)

## 2016-12-18 LAB — PROTIME-INR
INR: 1.05
PROTHROMBIN TIME: 13.7 s (ref 11.4–15.2)

## 2016-12-18 MED ORDER — SODIUM CHLORIDE 0.9 % IV SOLN
INTRAVENOUS | Status: DC
  Administered 2016-12-18 – 2016-12-21 (×4): via INTRAVENOUS

## 2016-12-18 MED ORDER — MORPHINE SULFATE (PF) 2 MG/ML IV SOLN
INTRAVENOUS | Status: AC
  Filled 2016-12-18: qty 1

## 2016-12-18 MED ORDER — SENNOSIDES-DOCUSATE SODIUM 8.6-50 MG PO TABS
1.0000 | ORAL_TABLET | Freq: Every day | ORAL | Status: DC
  Administered 2016-12-18 – 2016-12-19 (×2): 1 via ORAL
  Filled 2016-12-18 (×2): qty 1

## 2016-12-18 MED ORDER — MORPHINE SULFATE (PF) 4 MG/ML IV SOLN: 1.0000 mg | INTRAVENOUS | Status: DC | PRN

## 2016-12-18 MED ORDER — HYDRALAZINE HCL 20 MG/ML IJ SOLN: 5.0000 mg | INTRAMUSCULAR | Status: DC | PRN

## 2016-12-18 MED ORDER — ZOLPIDEM TARTRATE 5 MG PO TABS: 5.0000 mg | ORAL_TABLET | Freq: Every evening | ORAL | Status: DC | PRN

## 2016-12-18 MED ORDER — OXYCODONE-ACETAMINOPHEN 5-325 MG PO TABS
1.0000 | ORAL_TABLET | ORAL | Status: DC | PRN
  Administered 2016-12-18 – 2016-12-19 (×3): 1 via ORAL
  Filled 2016-12-18 (×3): qty 1

## 2016-12-18 MED ORDER — MORPHINE SULFATE (PF) 2 MG/ML IV SOLN
1.0000 mg | INTRAVENOUS | Status: DC | PRN
  Administered 2016-12-18: 1 mg via INTRAVENOUS

## 2016-12-18 MED ORDER — DARIFENACIN HYDROBROMIDE ER 7.5 MG PO TB24
7.5000 mg | ORAL_TABLET | Freq: Every day | ORAL | Status: DC
  Administered 2016-12-19 – 2016-12-21 (×3): 7.5 mg via ORAL
  Filled 2016-12-18 (×3): qty 1

## 2016-12-18 MED ORDER — METHOCARBAMOL 500 MG PO TABS: 500.0000 mg | ORAL_TABLET | Freq: Three times a day (TID) | ORAL | Status: DC | PRN

## 2016-12-18 MED ORDER — POLYETHYLENE GLYCOL 3350 17 G PO PACK: 17.0000 g | PACK | Freq: Every day | ORAL | Status: DC | PRN

## 2016-12-18 MED ORDER — ZOLPIDEM TARTRATE 5 MG PO TABS
5.0000 mg | ORAL_TABLET | Freq: Every day | ORAL | Status: DC
  Administered 2016-12-18: 5 mg via ORAL
  Filled 2016-12-18: qty 1

## 2016-12-18 MED ORDER — FENTANYL CITRATE (PF) 100 MCG/2ML IJ SOLN
25.0000 ug | Freq: Once | INTRAMUSCULAR | Status: AC
  Administered 2016-12-18: 25 ug via INTRAVENOUS
  Filled 2016-12-18: qty 2

## 2016-12-18 MED ORDER — CALCIUM CARBONATE 1250 (500 CA) MG PO TABS
1.0000 | ORAL_TABLET | Freq: Every day | ORAL | Status: DC
  Administered 2016-12-19 – 2016-12-21 (×3): 500 mg via ORAL
  Filled 2016-12-18 (×3): qty 1

## 2016-12-18 MED ORDER — ADULT MULTIVITAMIN W/MINERALS CH
1.0000 | ORAL_TABLET | Freq: Every day | ORAL | Status: DC
  Administered 2016-12-18 – 2016-12-21 (×4): 1 via ORAL
  Filled 2016-12-18 (×4): qty 1

## 2016-12-18 MED ORDER — LEVOTHYROXINE SODIUM 25 MCG PO TABS
25.0000 ug | ORAL_TABLET | Freq: Every day | ORAL | Status: DC
  Administered 2016-12-19 – 2016-12-21 (×3): 25 ug via ORAL
  Filled 2016-12-18 (×3): qty 1

## 2016-12-18 MED ORDER — ACETAMINOPHEN 325 MG PO TABS: 650.0000 mg | ORAL_TABLET | Freq: Four times a day (QID) | ORAL | Status: DC | PRN

## 2016-12-18 MED ORDER — PANTOPRAZOLE SODIUM 40 MG PO TBEC
40.0000 mg | DELAYED_RELEASE_TABLET | Freq: Every day | ORAL | Status: DC
  Administered 2016-12-18 – 2016-12-21 (×4): 40 mg via ORAL
  Filled 2016-12-18 (×4): qty 1

## 2016-12-18 MED ORDER — ONDANSETRON HCL 4 MG/2ML IJ SOLN
4.0000 mg | Freq: Three times a day (TID) | INTRAMUSCULAR | Status: DC | PRN
  Administered 2016-12-19: 4 mg via INTRAVENOUS

## 2016-12-18 MED ORDER — ALPRAZOLAM 0.25 MG PO TABS: 0.2500 mg | ORAL_TABLET | Freq: Three times a day (TID) | ORAL | Status: DC

## 2016-12-18 MED ORDER — ESCITALOPRAM OXALATE 10 MG PO TABS: 20.0000 mg | ORAL_TABLET | Freq: Every day | ORAL | Status: DC

## 2016-12-18 MED ORDER — VITAMIN D 1000 UNITS PO TABS
1000.0000 [IU] | ORAL_TABLET | Freq: Every day | ORAL | Status: DC
  Administered 2016-12-19 – 2016-12-21 (×3): 1000 [IU] via ORAL
  Filled 2016-12-18 (×3): qty 1

## 2016-12-18 MED ORDER — MIRTAZAPINE 15 MG PO TABS
15.0000 mg | ORAL_TABLET | Freq: Every day | ORAL | Status: DC
  Administered 2016-12-18 – 2016-12-21 (×4): 15 mg via ORAL
  Filled 2016-12-18 (×3): qty 1
  Filled 2016-12-18: qty 0.5
  Filled 2016-12-18: qty 1
  Filled 2016-12-18: qty 0.5

## 2016-12-18 MED ORDER — ASPIRIN 81 MG PO CHEW
81.0000 mg | CHEWABLE_TABLET | Freq: Every day | ORAL | Status: DC
  Administered 2016-12-19 – 2016-12-21 (×3): 81 mg via ORAL
  Filled 2016-12-18 (×3): qty 1

## 2016-12-18 NOTE — ED Notes (Addendum)
RN spoke to Pts caregiver, Ambrose Finland at [phone] to update on Pts status.  Pt gave RN permission to share medical information with Levada Dy this one time but does not want any additional information shared with Levada Dy or with her Daughter, Neoma Laming.

## 2016-12-18 NOTE — ED Notes (Signed)
Ginette Pitman called from [facility] to check on Pt.  Pt gave RN permission to share information.

## 2016-12-18 NOTE — ED Triage Notes (Signed)
Per EMS, patient came from home and is w/c bound. She was transferring from her w/c to the bed when she fell and hurt her left ankle. Per EMS is was a 90 degree deformity. Patient is complaining of 10/10 pain, 111mcg of fent was given in route. She has a towel roll around her neck for precautions.

## 2016-12-18 NOTE — Progress Notes (Signed)
Ms. Vidovich is a pleasant lady who I took care of a few years ago.  I will assume her orthopedic care.  I have reviewed her xrays and will require operative fixation tomorrow if medically optimized.  CT ankle has been ordered to preop planning.  Appreciate transfer to [facility] for hospitalist admission.  I'll see the patient in the morning to discuss treatment plan.  Azucena Cecil, MD [place] 7:33 PM

## 2016-12-18 NOTE — ED Provider Notes (Signed)
[facility] DEPT Provider Note   CSN: [id number] Arrival date & time: 12/18/16  1622     History   Chief Complaint Chief Complaint  Patient presents with  . Fall  . Ankle Pain    HPI Alexandra Booth is a 83 y.o. female.  HPI   83 year old female presents with concern for fall and left ankle pain. Patient reports that she was getting off of her bed and onto her bedside commode, when she tripped and fell, hurting her ankle. Reports she had severe pain in her left ankle. Reports mild neck pain. Denies hitting her head, headache, numbness, weakness, loss of consciousness. Reports that she was recently in the hospital for pneumonia, and has a continuing but improving cough, dyspnea. Reports that she just got out of the nursing home yesterday, and this was her first day home. Reports she was home with a home health aide at the time of her fall. EMS had reported severe deformity of her left ankle which they reduced en route, provided pain medication. Patient reports that her pain is now well controlled.   Past Medical History:  Diagnosis Date  . Depression with anxiety   . GERD (gastroesophageal reflux disease)   . Hypothyroidism     Patient Active Problem List   Diagnosis Date Noted  . Closed left ankle fracture 12/18/2016  . Fracture of left proximal fibula 12/18/2016  . GERD (gastroesophageal reflux disease)   . Depression with anxiety   . Fever 09/14/2014  . Compression fracture of body of thoracic vertebra ([place]) 09/14/2014  . Osteoporosis 09/14/2014  . Irregular heart beats 09/14/2014  . Abnormal chest x-ray   . Acute blood loss anemia   . Macrocytic anemia   . Obesity 09/12/2014  . Fall 09/11/2014  . UTI (lower urinary tract infection) 09/11/2014  . Chronic kidney disease (CKD), stage III (moderate) 09/11/2014  . Fracture, proximal femur ([place]) 09/11/2014  . Femur fracture, left ([place]) 09/11/2014  . Opacity of lung on imaging study 09/11/2014  . URINARY RETENTION  10/17/2008  . HYPERTENSION 09/07/2008  . HYPOKALEMIA 07/21/2008  . DEMENTIA 07/21/2008  . KNEE PAIN, BILATERAL 05/17/2008  . ACCIDENTAL FALL, HX OF 05/17/2008  . ABDOMINAL PAIN OTHER SPECIFIED SITE 02/05/2008  . DYSPHAGIA UNSPECIFIED 01/13/2008  . Anemia 09/21/2007  . THYROIDECTOMY, HX OF 09/03/2007  . LAMINECTOMY, LUMBAR, HX OF 09/03/2007  . INTERTRIGO, CANDIDAL 06/24/2007  . CARCINOID TUMOR 05/01/2007  . CHEST PAIN 05/01/2007  . HEMORRHOIDS, EXTERNAL 12/23/2006  . 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 Hypothyroidism 04/28/2006  . T2DM (type 2 diabetes mellitus) ([place]) 04/28/2006  . HYPERLIPIDEMIA 04/28/2006  . DEPRESSION 04/28/2006  . Coronary atherosclerosis 04/28/2006  . [place] DISEASE, LUMBAR SPINE 04/28/2006  . LUMBAR RADICULOPATHY 04/28/2006    Past Surgical History:  Procedure Laterality Date  . FEMUR IM NAIL Left 09/12/2014   Procedure: INTRAMEDULLARY (IM) NAIL FEMORAL;  Surgeon: Leandrew Koyanagi, MD;  Location: [facility];  Service: Orthopedics;  Laterality: Left;  . LAMINECTOMY      OB History    No data available       Home Medications    Prior to Admission medications   Medication Sig Start Date End Date Taking? Authorizing Provider  acetaminophen (TYLENOL) 325 MG tablet Take 2 tablets (650 mg total) by mouth every 6 (six) hours as needed for mild pain (or Fever >/= 101). Patient taking differently: Take 325 mg by mouth 2 (two) times daily.  09/19/14  Yes Rabbani, Marjan,  MD  ALPRAZolam (XANAX) 0.5 MG tablet Take 0.5 mg by mouth at bedtime.   Yes [provider]  Cholecalciferol 1000 UNITS capsule Take 1,000 Units by mouth daily.   Yes [provider]  donepezil (ARICEPT) 10 MG tablet Take 10 mg by mouth at bedtime.   Yes [provider]  escitalopram (LEXAPRO) 10 MG tablet Take 10 mg by mouth daily.   Yes [provider]  HYDROcodone-acetaminophen (NORCO/VICODIN) 5-325 MG tablet Take 1 tablet by mouth  at bedtime.   Yes [provider]  levothyroxine (SYNTHROID, LEVOTHROID) 25 MCG tablet Take 25 mcg by mouth daily before breakfast.   Yes [provider]  losartan (COZAAR) 50 MG tablet Take 50 mg by mouth daily.   Yes [provider]  Melatonin 3 MG TABS Take 1 tablet by mouth at bedtime.   Yes [provider]  memantine (NAMENDA) 10 MG tablet Take 10 mg by mouth 2 (two) times daily.   Yes [provider]  Menthol, Topical Analgesic, (BIOFREEZE EX) Apply 1 application topically 3 (three) times daily. 3% Apply to knees   Yes [provider]  Multiple Vitamins-Minerals (MULTIVITAMIN WITH MINERALS) tablet Take 1 tablet by mouth daily.   Yes [provider]  nystatin (NYSTATIN) powder Apply 1 Bottle topically 3 (three) times daily as needed (skin irritation).   Yes [provider]  omeprazole (PRILOSEC) 40 MG capsule Take 40 mg by mouth 2 (two) times daily.   Yes [provider]  phenazopyridine (PYRIDIUM) 200 MG tablet Take 200 mg by mouth daily as needed for pain (painful urination).   Yes [provider]  Polyethyl Glycol-Propyl Glycol (SYSTANE) 0.4-0.3 % SOLN Apply 1 drop to eye 2 (two) times daily as needed (dry eyes).   Yes [provider]  senna (SENOKOT) 8.6 MG tablet Take 1 tablet by mouth 2 (two) times daily.   Yes [provider]  solifenacin (VESICARE) 5 MG tablet Take 5 mg by mouth daily.   Yes [provider]  vitamin B-12 (CYANOCOBALAMIN) 500 MCG tablet Take 500 mcg by mouth daily.   Yes [provider]  aspirin 81 MG chewable tablet Chew 1 tablet (81 mg total) by mouth daily. 10/13/14   Juluis Mire, MD  calcium carbonate (OS-CAL - DOSED IN MG OF ELEMENTAL CALCIUM) 1250 (500 CA) MG tablet Take 1 tablet (500 mg of elemental calcium total) by mouth daily with breakfast. 09/19/14   Juluis Mire, MD  feeding supplement, ENSURE ENLIVE, (ENSURE ENLIVE) LIQD Take 237  mLs by mouth 2 (two) times daily between meals. 09/19/14   Juluis Mire, MD  mirtazapine (REMERON) 15 MG tablet Take 15 mg by mouth daily.    [provider]  oxyCODONE (OXY IR/ROXICODONE) 5 MG immediate release tablet Take 1 tablet (5 mg total) by mouth 2 (two) times daily as needed for breakthrough pain ((for MODERATE breakthrough pain)). 09/19/14   Juluis Mire, MD  polyethylene glycol (MIRALAX / GLYCOLAX) packet Take 17 g by mouth daily as needed. 09/19/14   Juluis Mire, MD  senna-docusate (SENOKOT-S) 8.6-50 MG per tablet Take 1 tablet by mouth daily. 09/19/14   Juluis Mire, MD  zolpidem (AMBIEN) 5 MG tablet Take 5 mg by mouth at bedtime.    [provider]    Family History Family History  Problem Relation Age of Onset  . Kidney disease Mother   . Kidney disease Father     Social History Social History  Substance Use Topics  .  Smoking status: Former Research scientist (life sciences)  . Smokeless tobacco: Never Used  . Alcohol use No     Allergies   Diazepam; Penicillins; and Sulfonamide derivatives   Review of Systems Review of Systems  Constitutional: Negative for fever.  HENT: Negative for sore throat.   Eyes: Negative for visual disturbance.  Respiratory: Positive for cough (improving) and shortness of breath (has been chronic since illness but improving).   Cardiovascular: Negative for chest pain.  Gastrointestinal: Negative for abdominal pain, nausea and vomiting.  Genitourinary: Negative for difficulty urinating.  Musculoskeletal: Negative for back pain and neck pain.  Skin: Negative for rash.  Neurological: Negative for syncope and headaches.     Physical Exam Updated Vital Signs BP (!) 168/55 (BP Location: Right Arm)   Pulse 65   Temp 97.8 F (36.6 C) (Oral)   Resp 16   Ht 5\' 9"  (1.753 m)   Wt 95.3 kg (210 lb)   SpO2 98%   BMI 31.01 kg/m   Physical Exam  Constitutional: She is oriented to person, place, and time. She appears well-developed and  well-nourished. No distress.  HENT:  Head: Normocephalic and atraumatic.  Eyes: Conjunctivae and EOM are normal.  Neck: Normal range of motion.  Cardiovascular: Normal rate, regular rhythm, normal heart sounds and intact distal pulses.  Exam reveals no gallop and no friction rub.   No murmur heard. Pulmonary/Chest: Effort normal and breath sounds normal. No respiratory distress. She has no wheezes. She has no rales.  Abdominal: Soft. She exhibits no distension. There is no tenderness. There is no guarding.  Musculoskeletal: She exhibits no edema.       Left knee: Tenderness found.       Left ankle: She exhibits swelling, ecchymosis and deformity. She exhibits normal pulse. Lacerations: superficial abrasion distal tibia. Tenderness. Lateral malleolus, medial malleolus and AITFL tenderness found.  Neurological: She is alert and oriented to person, place, and time.  Skin: Skin is warm and dry. No rash noted. She is not diaphoretic. No erythema.  Nursing note and vitals reviewed.    ED Treatments / Results  Labs (all labs ordered are listed, but only abnormal results are displayed) Labs Reviewed  CBC WITH DIFFERENTIAL/PLATELET - Abnormal; Notable for the following:       Result Value   RBC 3.04 (*)    Hemoglobin 10.1 (*)    HCT 30.1 (*)    All other components within normal limits  COMPREHENSIVE METABOLIC PANEL - Abnormal; Notable for the following:    Glucose, Bld 103 (*)    BUN 21 (*)    Creatinine, Ser 1.47 (*)    Calcium 8.8 (*)    Total Protein 6.3 (*)    Albumin 3.1 (*)    ALT 10 (*)    GFR calc non Af Amer 30 (*)    GFR calc Af Amer 35 (*)    All other components within normal limits  PROTIME-INR  APTT  VITAMIN B12  FOLATE  IRON AND TIBC  FERRITIN  RETICULOCYTES  TYPE AND SCREEN  ABO/RH    EKG  EKG Interpretation None       Radiology Dg Knee 2 Views Left  Result Date: 12/18/2016 CLINICAL DATA:  Ankle pain radiating to or at the knee EXAM: LEFT KNEE - 1-2  VIEW COMPARISON:  None. FINDINGS: In diffuse osteopenia. No evidence of fracture of the tibia. Oblique minimally displaced fracture of the proximal fibula with mild anterior displacement. Osteoarthritic changes of all 3 compartments of the  knee joint. Partially visualized intramedullary rod within the left femur. IMPRESSION: Oblique minimally displaced fracture of the proximal fibula. Three compartment osteoarthritic changes of the knee. Osteopenia. Electronically Signed   By: Fidela Salisbury M.D.   On: 12/18/2016 17:46   Dg Ankle Complete Left  Result Date: 12/18/2016 CLINICAL DATA:  Fall. EXAM: LEFT ANKLE COMPLETE - 3+ VIEW COMPARISON:  None. FINDINGS: Set fractures are noted through the metadiaphysis of the distal left tibia and fibula. Mild angulation at the fibular fractures. No subluxation or dislocation. IMPRESSION: Fractures within the distal left radial and fibular metadiaphysis. Electronically Signed   By: Rolm Baptise M.D.   On: 12/18/2016 17:45   Ct Head Wo Contrast  Result Date: 12/18/2016 CLINICAL DATA:  Fall EXAM: CT HEAD WITHOUT CONTRAST CT CERVICAL SPINE WITHOUT CONTRAST TECHNIQUE: Multidetector CT imaging of the head and cervical spine was performed following the standard protocol without intravenous contrast. Multiplanar CT image reconstructions of the cervical spine were also generated. COMPARISON:  CT head dated 09/11/2014 FINDINGS: CT HEAD FINDINGS Brain: No evidence of acute infarction, hemorrhage, hydrocephalus, extra-axial collection or mass lesion/mass effect. Global cortical atrophy. Subcortical white matter and periventricular small vessel ischemic changes. Vascular: Intracranial atherosclerosis. Skull: Normal. Negative for fracture or focal lesion. Sinuses/Orbits: The visualized paranasal sinuses are essentially clear. The mastoid air cells are unopacified. Other: None. CT CERVICAL SPINE FINDINGS Alignment: Mild straightening of the cervical spine, likely positional. Skull  base and vertebrae: No acute fracture. No primary bone lesion or focal pathologic process. Soft tissues and spinal canal: No prevertebral fluid or swelling. No visible canal hematoma. Disc levels:  Mild multilevel degenerative changes. Spinal canal is patent. Upper chest: Visualized lung apices are clear. Other: Visualized thyroid is grossly unremarkable. IMPRESSION: No evidence of acute intracranial abnormality. Atrophy with small vessel ischemic changes. No evidence of traumatic injury to the cervical spine. Mild multilevel degenerative changes. Electronically Signed   By: Julian Hy M.D.   On: 12/18/2016 19:06   Ct Cervical Spine Wo Contrast  Result Date: 12/18/2016 CLINICAL DATA:  Fall EXAM: CT HEAD WITHOUT CONTRAST CT CERVICAL SPINE WITHOUT CONTRAST TECHNIQUE: Multidetector CT imaging of the head and cervical spine was performed following the standard protocol without intravenous contrast. Multiplanar CT image reconstructions of the cervical spine were also generated. COMPARISON:  CT head dated 09/11/2014 FINDINGS: CT HEAD FINDINGS Brain: No evidence of acute infarction, hemorrhage, hydrocephalus, extra-axial collection or mass lesion/mass effect. Global cortical atrophy. Subcortical white matter and periventricular small vessel ischemic changes. Vascular: Intracranial atherosclerosis. Skull: Normal. Negative for fracture or focal lesion. Sinuses/Orbits: The visualized paranasal sinuses are essentially clear. The mastoid air cells are unopacified. Other: None. CT CERVICAL SPINE FINDINGS Alignment: Mild straightening of the cervical spine, likely positional. Skull base and vertebrae: No acute fracture. No primary bone lesion or focal pathologic process. Soft tissues and spinal canal: No prevertebral fluid or swelling. No visible canal hematoma. Disc levels:  Mild multilevel degenerative changes. Spinal canal is patent. Upper chest: Visualized lung apices are clear. Other: Visualized thyroid is grossly  unremarkable. IMPRESSION: No evidence of acute intracranial abnormality. Atrophy with small vessel ischemic changes. No evidence of traumatic injury to the cervical spine. Mild multilevel degenerative changes. Electronically Signed   By: Julian Hy M.D.   On: 12/18/2016 19:06   Ct Ankle Left Wo Contrast  Result Date: 12/18/2016 CLINICAL DATA:  83 year old female with fracture of the left tibia and fibula. Operative planning. EXAM: CT OF THE LEFT ANKLE WITHOUT CONTRAST TECHNIQUE: Multidetector CT  imaging of the left ankle was performed according to the standard protocol. Multiplanar CT image reconstructions were also generated. COMPARISON:  Radiograph dated 12/18/2009 FINDINGS: Bones/Joint/Cartilage There is an angulated and displaced comminuted fracture of the distal fibula. Approximately 50% shaft width anterior displacement of the distal fracture fragment. There is a comminuted oblique fracture of the distal tibial diaphysis with approximately 50% shaft width posterior and 6 mm lateral displacement of the distal fracture fragment. No intra-articular extension noted. There is advanced osteopenia. No dislocation. A the ankle mortise is intact. Ligaments Suboptimally assessed by CT. Muscles and Tendons No large intramuscular hematoma. Soft tissues Diffuse subcutaneous soft tissue edema and thickening. No fluid collection. IMPRESSION: 1. Comminuted and mildly displaced fractures of the distal tibia and fibular diaphysis. No dislocation. 2. Severe osteopenia. Electronically Signed   By: Anner Crete M.D.   On: 12/18/2016 21:20    Procedures .Splint Application Date/Time: 4/74/2595 3:14 AM Performed by: Gareth Morgan Authorized by: Gareth Morgan   Consent:    Consent obtained:  Verbal   Consent given by:  Patient Pre-procedure details:    Sensation:  Normal Procedure details:    Laterality:  Left   Location:  Ankle   Ankle:  L ankle   Strapping: no     Cast type:  Short leg    Splint type:  Ankle stirrup   Supplies:  Cotton padding and Ortho-Glass Post-procedure details:    Pain:  Unchanged   Sensation:  Normal   Patient tolerance of procedure:  Tolerated well, no immediate complications   (including critical care time)  Medications Ordered in ED Medications  acetaminophen (TYLENOL) tablet 650 mg (not administered)  aspirin chewable tablet 81 mg (not administered)  calcium carbonate (OS-CAL - dosed in mg of elemental calcium) tablet 500 mg of elemental calcium (not administered)  cholecalciferol (VITAMIN D) tablet 1,000 Units (not administered)  levothyroxine (SYNTHROID, LEVOTHROID) tablet 25 mcg (not administered)  mirtazapine (REMERON) tablet 15 mg (15 mg Oral Given 12/18/16 2248)  multivitamin with minerals tablet 1 tablet (1 tablet Oral Given 12/18/16 2248)  pantoprazole (PROTONIX) EC tablet 40 mg (40 mg Oral Given 12/18/16 2248)  polyethylene glycol (MIRALAX / GLYCOLAX) packet 17 g (not administered)  senna-docusate (Senokot-S) tablet 1 tablet (1 tablet Oral Given 12/18/16 2248)  darifenacin (ENABLEX) 24 hr tablet 7.5 mg (not administered)  zolpidem (AMBIEN) tablet 5 mg (5 mg Oral Given 12/18/16 2248)  hydrALAZINE (APRESOLINE) injection 5 mg (not administered)  oxyCODONE-acetaminophen (PERCOCET/ROXICET) 5-325 MG per tablet 1 tablet (1 tablet Oral Given 12/18/16 2249)  ondansetron (ZOFRAN) injection 4 mg (not administered)  methocarbamol (ROBAXIN) tablet 500 mg (not administered)  zolpidem (AMBIEN) tablet 5 mg (not administered)  0.9 %  sodium chloride infusion ( Intravenous New Bag/Given 12/18/16 2305)  morphine 4 MG/ML injection 1 mg (not administered)  fentaNYL (SUBLIMAZE) injection 25 mcg (25 mcg Intravenous Given 12/18/16 1708)  fentaNYL (SUBLIMAZE) injection 25 mcg (25 mcg Intravenous Given 12/18/16 1921)     Initial Impression / Assessment and Plan / ED Course  I have reviewed the triage vital signs and the nursing notes.  Pertinent labs & imaging  results that were available during my care of the patient were reviewed by me and considered in my medical decision making (see chart for details).     83 year old female with a history of recent admission for pneumonia discharged from SNF yesterday, with hx of diabetes, hypertension, hlpd, CKD, CAD, presents with concern for mechanical fall with left ankle pain, mild neck  pain. CT head and cervical spine shows no acute findings. Patient neurovascularly intact on arrival to the emergency department, with history of significant ankle deformity reduced by EMS. X-ray of the ankle shows fracture of the left distal tibia and fibula as well as fracture of the proximal fibula. Patient without other medical concerns. Consulted orthopedics, Dr. Erlinda Hong will take patient to the OR tomorrow. Discussed with Dr. Blaine Hamper. Plan to transfer to [facility] for further care. Placed in [facility] splint.  Patient alignment was initially normal, then following XR had changed and was again realigned and splint placed.   Final Clinical Impressions(s) / ED Diagnoses   Final diagnoses:  Gastroesophageal reflux disease without esophagitis  Depression with anxiety  Closed fracture of left tibia and fibula, initial encounter  Closed fracture of proximal end of left fibula, unspecified fracture morphology, initial encounter    New Prescriptions Current Discharge Medication List       Gareth Morgan, MD 12/19/16 [phone]

## 2016-12-18 NOTE — H&P (Signed)
History and Physical    Alexandra Booth YKZ:[id number] DOB: 1923-09-21 DOA: 12/18/2016  Referring MD/NP/PA:   PCP: Janifer Adie, MD   Patient coming from:  The patient is coming from home.  At baseline, pt is partially dependent for most of ADL.   Chief Complaint: fall, left ankle and knee pain.  HPI: Alexandra Booth is a 83 y.o. female with medical history significant of hypertension, hyperlipidemia, diet-controlled diabetes, GERD, hypothyroidism, depression, anxiety, carcinoid tumor, CKD-3, CAD, dementia, who presents with fall, and left ankle and left knee pain.  Pt states that she just went home from SNF yesterday. She fell accidentally when she was getting off of her bed and onto her bedside commode, but tripped. She reports severe pain in her left ankle and mild pain in left knee. She also has mild neck pain. The pain in the left ankle is constant, 10 out of 10 in severity, sharp, nonradiating. Denies hitting her head, headache, numbness, weakness, loss of consciousness. Pt states that that she was recently in the hospital for pneumonia, and has a continuing but improving cough and dyspnea, which are mild. EMS had reported severe deformity of her left ankle which they reduced en route. Patient does not have nausea, vomiting, diarrhea, abdominal pain, symptoms of UTI or unilateral weakness.  ED Course: pt was found to have WBC 8.7, stable renal function, temperature normal, no tachycardia, oxygen 98% on room air. Patient is admitted to [facility] bed as inpatient. Orthopedic surgeon, Dr. Erlinda Hong was consulted.  # X-ray of left knee showed oblique minimally displaced fracture of the proximal fibula. # X-ray of left ankle showed fractures within the distal left radial and fibular metadiaphysis. # CT of the head and C-spine are negative for acute abnormalities.  Review of Systems:   General: no fevers, chills, no changes in body weight,  has fatigue HEENT: no blurry vision, hearing changes  or sore throat Respiratory: has mild dyspnea, coughing, no wheezing CV: no chest pain, no palpitations GI: no nausea, vomiting, abdominal pain, diarrhea, constipation GU: no dysuria, burning on urination, increased urinary frequency, hematuria  Ext: no leg edema Neuro: no unilateral weakness, numbness, or tingling, no vision change or hearing loss Skin: no rash, no skin tear. MSK: has pain in left ankle and left knee. Heme: No easy bruising.  Travel history: No recent long distant travel.  Allergy:  Allergies  Allergen Reactions  . Diazepam     REACTION: Unknown reaction  . Penicillins     REACTION: rash, itching  . Sulfonamide Derivatives     REACTION: GI upset    Past Medical History:  Diagnosis Date  . Depression with anxiety   . GERD (gastroesophageal reflux disease)   . Hypothyroidism     Past Surgical History:  Procedure Laterality Date  . FEMUR IM NAIL Left 09/12/2014   Procedure: INTRAMEDULLARY (IM) NAIL FEMORAL;  Surgeon: Leandrew Koyanagi, MD;  Location: [facility];  Service: Orthopedics;  Laterality: Left;  . LAMINECTOMY      Social History:  reports that she has quit smoking. She has never used smokeless tobacco. She reports that she does not drink alcohol or use drugs.  Family History:  Family History  Problem Relation Age of Onset  . Kidney disease Mother   . Kidney disease Father      Prior to Admission medications   Medication Sig Start Date End Date Taking? Authorizing Provider  acetaminophen (TYLENOL) 325 MG tablet Take 2 tablets (650 mg total) by  mouth every 6 (six) hours as needed for mild pain (or Fever >/= 101). 09/19/14   Juluis Mire, MD  ALPRAZolam Duanne Moron) 0.25 MG tablet Take 0.25 mg by mouth 3 (three) times daily.    [provider]  aspirin 81 MG chewable tablet Chew 1 tablet (81 mg total) by mouth daily. 10/13/14   Juluis Mire, MD  calcium carbonate (OS-CAL - DOSED IN MG OF ELEMENTAL CALCIUM) 1250 (500 CA) MG tablet Take 1 tablet (500 mg  of elemental calcium total) by mouth daily with breakfast. 09/19/14   Juluis Mire, MD  Cholecalciferol 1000 UNITS capsule Take 1,000 Units by mouth daily.    [provider]  escitalopram (LEXAPRO) 20 MG tablet Take 20 mg by mouth daily.    [provider]  feeding supplement, ENSURE ENLIVE, (ENSURE ENLIVE) LIQD Take 237 mLs by mouth 2 (two) times daily between meals. 09/19/14   Juluis Mire, MD  levothyroxine (SYNTHROID, LEVOTHROID) 25 MCG tablet Take 25 mcg by mouth daily before breakfast.    [provider]  mirtazapine (REMERON) 15 MG tablet Take 15 mg by mouth daily.    [provider]  Multiple Vitamins-Minerals (MULTIVITAMIN WITH MINERALS) tablet Take 1 tablet by mouth daily.    [provider]  omeprazole (PRILOSEC) 40 MG capsule Take 40 mg by mouth 2 (two) times daily.    [provider]  oxyCODONE (OXY IR/ROXICODONE) 5 MG immediate release tablet Take 1 tablet (5 mg total) by mouth 2 (two) times daily as needed for breakthrough pain ((for MODERATE breakthrough pain)). 09/19/14   Juluis Mire, MD  polyethylene glycol (MIRALAX / GLYCOLAX) packet Take 17 g by mouth daily as needed. 09/19/14   Juluis Mire, MD  senna-docusate (SENOKOT-S) 8.6-50 MG per tablet Take 1 tablet by mouth daily. 09/19/14   Juluis Mire, MD  solifenacin (VESICARE) 5 MG tablet Take 5 mg by mouth daily.    [provider]  zolpidem (AMBIEN) 5 MG tablet Take 5 mg by mouth at bedtime.    [provider]    Physical Exam: Vitals:   12/18/16 1635 12/18/16 1647 12/18/16 1648 12/18/16 1931  BP:  (!) 148/51  (!) 145/60  Pulse:  67  67  Resp:  15  18  Temp:  98.3 F (36.8 C)    TempSrc:  Oral    SpO2: 97% 98%  99%  Weight:   95.3 kg (210 lb)   Height:   5\' 9"  (1.753 m)    General: Not in acute distress HEENT:       Eyes: PERRL, EOMI, no scleral icterus.       ENT: No discharge from the ears and nose, no pharynx injection, no  tonsillar enlargement.        Neck: No JVD, no bruit, no mass felt. Heme: No neck lymph node enlargement. Cardiac: S1/S2, RRR, No murmurs, No gallops or rubs. Respiratory: No rales, wheezing, rhonchi or rubs. GI: Soft, nondistended, nontender, no rebound pain, no organomegaly, BS present. GU: No hematuria Ext: No pitting leg edema bilaterally. 2+DP/PT pulse bilaterally. Musculoskeletal: she has tenderness in left knee and left ankle. The left ankle is swelling with deformity and externally rotated. Haas superficial skin abrasion to distal tibia. Skin: No rashes.  Neuro: Alert, oriented X3, cranial nerves II-XII grossly intact, moves all extremities. Psych: Patient is not psychotic, no suicidal or hemocidal ideation.  Labs on Admission: I have personally reviewed following labs and imaging studies  CBC: No results for input(s): WBC,  NEUTROABS, HGB, HCT, MCV, PLT in the last 168 hours. Basic Metabolic Panel: No results for input(s): NA, K, CL, CO2, GLUCOSE, BUN, CREATININE, CALCIUM, MG, PHOS in the last 168 hours. GFR: CrCl cannot be calculated (Patient's most recent lab result is older than the maximum 21 days allowed.). Liver Function Tests: No results for input(s): AST, ALT, ALKPHOS, BILITOT, PROT, ALBUMIN in the last 168 hours. No results for input(s): LIPASE, AMYLASE in the last 168 hours. No results for input(s): AMMONIA in the last 168 hours. Coagulation Profile: No results for input(s): INR, PROTIME in the last 168 hours. Cardiac Enzymes: No results for input(s): CKTOTAL, CKMB, CKMBINDEX, TROPONINI in the last 168 hours. BNP (last 3 results) No results for input(s): PROBNP in the last 8760 hours. HbA1C: No results for input(s): HGBA1C in the last 72 hours. CBG: No results for input(s): GLUCAP in the last 168 hours. Lipid Profile: No results for input(s): CHOL, HDL, LDLCALC, TRIG, CHOLHDL, LDLDIRECT in the last 72 hours. Thyroid Function Tests: No results for input(s): TSH,  T4TOTAL, FREET4, T3FREE, THYROIDAB in the last 72 hours. Anemia Panel: No results for input(s): VITAMINB12, FOLATE, FERRITIN, TIBC, IRON, RETICCTPCT in the last 72 hours. Urine analysis:    Component Value Date/Time   COLORURINE ORANGE (A) 09/11/2014 1147   APPEARANCEUR TURBID (A) 09/11/2014 1147   LABSPEC 1.020 09/11/2014 1147   PHURINE 5.0 09/11/2014 1147   GLUCOSEU NEGATIVE 09/11/2014 1147   GLUCOSEU NEG mg/dL 10/17/2008 1921   HGBUR MODERATE (A) 09/11/2014 1147   BILIRUBINUR NEGATIVE 09/11/2014 1147   KETONESUR NEGATIVE 09/11/2014 1147   PROTEINUR 100 (A) 09/11/2014 1147   UROBILINOGEN 1.0 09/11/2014 1147   NITRITE POSITIVE (A) 09/11/2014 1147   LEUKOCYTESUR LARGE (A) 09/11/2014 1147   Sepsis Labs: @LABRCNTIP (procalcitonin:4,lacticidven:4) )No results found for this or any previous visit (from the past 240 hour(s)).   Radiological Exams on Admission: Dg Knee 2 Views Left  Result Date: 12/18/2016 CLINICAL DATA:  Ankle pain radiating to or at the knee EXAM: LEFT KNEE - 1-2 VIEW COMPARISON:  None. FINDINGS: In diffuse osteopenia. No evidence of fracture of the tibia. Oblique minimally displaced fracture of the proximal fibula with mild anterior displacement. Osteoarthritic changes of all 3 compartments of the knee joint. Partially visualized intramedullary rod within the left femur. IMPRESSION: Oblique minimally displaced fracture of the proximal fibula. Three compartment osteoarthritic changes of the knee. Osteopenia. Electronically Signed   By: Fidela Salisbury M.D.   On: 12/18/2016 17:46   Dg Ankle Complete Left  Result Date: 12/18/2016 CLINICAL DATA:  Fall. EXAM: LEFT ANKLE COMPLETE - 3+ VIEW COMPARISON:  None. FINDINGS: Set fractures are noted through the metadiaphysis of the distal left tibia and fibula. Mild angulation at the fibular fractures. No subluxation or dislocation. IMPRESSION: Fractures within the distal left radial and fibular metadiaphysis. Electronically Signed    By: Rolm Baptise M.D.   On: 12/18/2016 17:45   Ct Head Wo Contrast  Result Date: 12/18/2016 CLINICAL DATA:  Fall EXAM: CT HEAD WITHOUT CONTRAST CT CERVICAL SPINE WITHOUT CONTRAST TECHNIQUE: Multidetector CT imaging of the head and cervical spine was performed following the standard protocol without intravenous contrast. Multiplanar CT image reconstructions of the cervical spine were also generated. COMPARISON:  CT head dated 09/11/2014 FINDINGS: CT HEAD FINDINGS Brain: No evidence of acute infarction, hemorrhage, hydrocephalus, extra-axial collection or mass lesion/mass effect. Global cortical atrophy. Subcortical white matter and periventricular small vessel ischemic changes. Vascular: Intracranial atherosclerosis. Skull: Normal. Negative for fracture or focal lesion. Sinuses/Orbits:  The visualized paranasal sinuses are essentially clear. The mastoid air cells are unopacified. Other: None. CT CERVICAL SPINE FINDINGS Alignment: Mild straightening of the cervical spine, likely positional. Skull base and vertebrae: No acute fracture. No primary bone lesion or focal pathologic process. Soft tissues and spinal canal: No prevertebral fluid or swelling. No visible canal hematoma. Disc levels:  Mild multilevel degenerative changes. Spinal canal is patent. Upper chest: Visualized lung apices are clear. Other: Visualized thyroid is grossly unremarkable. IMPRESSION: No evidence of acute intracranial abnormality. Atrophy with small vessel ischemic changes. No evidence of traumatic injury to the cervical spine. Mild multilevel degenerative changes. Electronically Signed   By: Julian Hy M.D.   On: 12/18/2016 19:06   Ct Cervical Spine Wo Contrast  Result Date: 12/18/2016 CLINICAL DATA:  Fall EXAM: CT HEAD WITHOUT CONTRAST CT CERVICAL SPINE WITHOUT CONTRAST TECHNIQUE: Multidetector CT imaging of the head and cervical spine was performed following the standard protocol without intravenous contrast. Multiplanar CT  image reconstructions of the cervical spine were also generated. COMPARISON:  CT head dated 09/11/2014 FINDINGS: CT HEAD FINDINGS Brain: No evidence of acute infarction, hemorrhage, hydrocephalus, extra-axial collection or mass lesion/mass effect. Global cortical atrophy. Subcortical white matter and periventricular small vessel ischemic changes. Vascular: Intracranial atherosclerosis. Skull: Normal. Negative for fracture or focal lesion. Sinuses/Orbits: The visualized paranasal sinuses are essentially clear. The mastoid air cells are unopacified. Other: None. CT CERVICAL SPINE FINDINGS Alignment: Mild straightening of the cervical spine, likely positional. Skull base and vertebrae: No acute fracture. No primary bone lesion or focal pathologic process. Soft tissues and spinal canal: No prevertebral fluid or swelling. No visible canal hematoma. Disc levels:  Mild multilevel degenerative changes. Spinal canal is patent. Upper chest: Visualized lung apices are clear. Other: Visualized thyroid is grossly unremarkable. IMPRESSION: No evidence of acute intracranial abnormality. Atrophy with small vessel ischemic changes. No evidence of traumatic injury to the cervical spine. Mild multilevel degenerative changes. Electronically Signed   By: Julian Hy M.D.   On: 12/18/2016 19:06     EKG: Not done in ED, will get one.   Assessment/Plan Principal Problem:   Closed left ankle fracture Active Problems:   Hypothyroidism   T2DM (type 2 diabetes mellitus) ([place])   HYPERTENSION   Coronary atherosclerosis   Fall   Chronic kidney disease (CKD), stage III (moderate)   Macrocytic anemia   GERD (gastroesophageal reflux disease)   Depression with anxiety   Fracture of left proximal fibula   Closed left ankle fracture and fracture of left proximal fibula due to mechanic fall: As evidenced by x-ray. Patient has moderate pain now. No neurovascular compromise. Orthopedic surgeon, Dr. Erlinda Hong was consulted.  - will  admit to Med-surg bed - Pain control: morphine prn and percocet - When necessary Zofran for nausea - Robaxin for muscle spasm - type and cross - INR/PTT -pt/ot when able to  Hypothyroidism: Last TSH was 0.406 on 09/11/14 -Continue home Synthroid  HTN: bp 148/51. Not on meds at home now -IV hydralazine when necessary  Hx of CAD: no CP -continue ASA  GERD: -Protonix  Depression and anxiety: Stable, no suicidal or homicidal ideations. -Continue home medications: Lexapro, Remeron, Xanax  DM-II: Last A1c 5.6 on 4.3.16, well controled. Patient is not taking meds at home. Blood sugar 125 today -check CBG every morning  Chronic kidney disease (CKD), stage III (moderate): stable. Baseline creatinine 1.7-2.0. Her creatinine is 1.53 -Follow up renal function by BMP  Macrocytic anemia: Hemoglobin 8.9 on 09/18/14-->7.9 today. -anemia  panel  -f/u by CBC   DVT ppx: SCD Code Status: DNR (I discussed with patient, and explained the meaning of CODE STATUS repeatedly and clearly. Patient wants to be DNR) .Family Communication: None at bed side.    Disposition Plan:  Anticipate discharge back to rehab environment Consults called:  Ortho, Dr. Erlinda Hong Admission status:  Inpatient/tele         Date of Service 12/18/2016    Ivor Costa [facility] Pager [phone]  If 7PM-7AM, please contact night-coverage [email or web address] Password [email or web address] 12/18/2016, 8:01 PM

## 2016-12-18 NOTE — Care Management (Signed)
ED CM reviewed CM consult. Patient is being admitted. CM will follow for discharge needs. Venita Sheffield RN CCM

## 2016-12-18 NOTE — ED Notes (Signed)
Patient transported to CT 

## 2016-12-19 ENCOUNTER — Inpatient Hospital Stay (HOSPITAL_COMMUNITY): Payer: Medicare (Managed Care)

## 2016-12-19 ENCOUNTER — Inpatient Hospital Stay (HOSPITAL_COMMUNITY): Payer: Medicare (Managed Care) | Admitting: Anesthesiology

## 2016-12-19 ENCOUNTER — Encounter (HOSPITAL_COMMUNITY)
Admission: EM | Disposition: A | Discharge status: Skilled Nursing Facility | Payer: Self-pay | Source: Home / Self Care | Attending: Family Medicine

## 2016-12-19 ENCOUNTER — Encounter (HOSPITAL_COMMUNITY): Payer: Self-pay | Admitting: *Deleted

## 2016-12-19 DIAGNOSIS — E039 Hypothyroidism, unspecified: Secondary | ICD-10-CM

## 2016-12-19 DIAGNOSIS — F418 Other specified anxiety disorders: Secondary | ICD-10-CM

## 2016-12-19 DIAGNOSIS — S82402A Unspecified fracture of shaft of left fibula, initial encounter for closed fracture: Secondary | ICD-10-CM

## 2016-12-19 DIAGNOSIS — S82202A Unspecified fracture of shaft of left tibia, initial encounter for closed fracture: Secondary | ICD-10-CM

## 2016-12-19 DIAGNOSIS — S82832A Other fracture of upper and lower end of left fibula, initial encounter for closed fracture: Secondary | ICD-10-CM

## 2016-12-19 DIAGNOSIS — N183 Chronic kidney disease, stage 3 (moderate): Secondary | ICD-10-CM

## 2016-12-19 DIAGNOSIS — K219 Gastro-esophageal reflux disease without esophagitis: Secondary | ICD-10-CM

## 2016-12-19 DIAGNOSIS — S82892A Other fracture of left lower leg, initial encounter for closed fracture: Secondary | ICD-10-CM

## 2016-12-19 DIAGNOSIS — I10 Essential (primary) hypertension: Secondary | ICD-10-CM

## 2016-12-19 HISTORY — PX: TIBIA IM NAIL INSERTION: SHX2516

## 2016-12-19 LAB — CBC
HCT: 30.8 % — ABNORMAL LOW (ref 36.0–46.0)
Hemoglobin: 9.6 g/dL — ABNORMAL LOW (ref 12.0–15.0)
MCH: 32.5 pg (ref 26.0–34.0)
MCHC: 31.2 g/dL (ref 30.0–36.0)
MCV: 104.4 fL — AB (ref 78.0–100.0)
PLATELETS: 200 10*3/uL (ref 150–400)
RBC: 2.95 MIL/uL — ABNORMAL LOW (ref 3.87–5.11)
RDW: 12.2 % (ref 11.5–15.5)
WBC: 8.7 10*3/uL (ref 4.0–10.5)

## 2016-12-19 LAB — ABO/RH: ABO/RH(D): O POS

## 2016-12-19 LAB — CREATININE, SERUM
Creatinine, Ser: 1.44 mg/dL — ABNORMAL HIGH (ref 0.44–1.00)
GFR calc Af Amer: 36 mL/min — ABNORMAL LOW (ref >60)
GFR calc non Af Amer: 31 mL/min — ABNORMAL LOW (ref >60)

## 2016-12-19 LAB — SURGICAL PCR SCREEN
MRSA, PCR: NEGATIVE
Staphylococcus aureus: NEGATIVE

## 2016-12-19 LAB — TYPE AND SCREEN
ABO/RH(D): O POS
ANTIBODY SCREEN: POSITIVE
DAT, IgG: NEGATIVE
PT AG Type: NEGATIVE

## 2016-12-19 SURGERY — INSERTION, INTRAMEDULLARY ROD, TIBIA
Anesthesia: Monitor Anesthesia Care | Site: Leg Lower | Laterality: Left

## 2016-12-19 MED ORDER — METHOCARBAMOL 500 MG PO TABS
500.0000 mg | ORAL_TABLET | Freq: Four times a day (QID) | ORAL | Status: DC | PRN
  Administered 2016-12-20 – 2016-12-21 (×2): 500 mg via ORAL
  Filled 2016-12-19 (×2): qty 1

## 2016-12-19 MED ORDER — ACETAMINOPHEN 325 MG PO TABS
650.0000 mg | ORAL_TABLET | Freq: Four times a day (QID) | ORAL | Status: DC | PRN
  Filled 2016-12-19: qty 2

## 2016-12-19 MED ORDER — ACETAMINOPHEN 650 MG RE SUPP: 650.0000 mg | Freq: Four times a day (QID) | RECTAL | Status: DC | PRN

## 2016-12-19 MED ORDER — ONDANSETRON HCL 4 MG/2ML IJ SOLN
INTRAMUSCULAR | Status: AC
  Filled 2016-12-19: qty 2

## 2016-12-19 MED ORDER — METHOCARBAMOL 1000 MG/10ML IJ SOLN
500.0000 mg | Freq: Four times a day (QID) | INTRAVENOUS | Status: DC | PRN
  Filled 2016-12-19: qty 5

## 2016-12-19 MED ORDER — ALUM & MAG HYDROXIDE-SIMETH 200-200-20 MG/5ML PO SUSP: 30.0000 mL | ORAL | Status: DC | PRN

## 2016-12-19 MED ORDER — CLINDAMYCIN PHOSPHATE 900 MG/50ML IV SOLN
900.0000 mg | INTRAVENOUS | Status: AC
  Administered 2016-12-19: 900 mg via INTRAVENOUS
  Filled 2016-12-19: qty 50

## 2016-12-19 MED ORDER — CEFAZOLIN SODIUM 1 G IJ SOLR
INTRAMUSCULAR | Status: AC
  Filled 2016-12-19: qty 20

## 2016-12-19 MED ORDER — PHENAZOPYRIDINE HCL 200 MG PO TABS
200.0000 mg | ORAL_TABLET | Freq: Every day | ORAL | Status: DC | PRN
  Administered 2016-12-19: 200 mg via ORAL
  Filled 2016-12-19 (×2): qty 1

## 2016-12-19 MED ORDER — PHENOL 1.4 % MT LIQD: 1.0000 | OROMUCOSAL | Status: DC | PRN

## 2016-12-19 MED ORDER — VITAMIN B-12 500 MCG PO TABS: 500.0000 ug | ORAL_TABLET | Freq: Every day | ORAL | Status: DC

## 2016-12-19 MED ORDER — ENSURE ENLIVE PO LIQD
237.0000 mL | Freq: Two times a day (BID) | ORAL | Status: DC
  Administered 2016-12-20 – 2016-12-21 (×3): 237 mL via ORAL

## 2016-12-19 MED ORDER — HYPROMELLOSE (GONIOSCOPIC) 2.5 % OP SOLN: 1.0000 [drp] | OPHTHALMIC | Status: DC | PRN

## 2016-12-19 MED ORDER — FENTANYL CITRATE (PF) 100 MCG/2ML IJ SOLN
INTRAMUSCULAR | Status: AC
  Administered 2016-12-19: 75 ug via INTRAVENOUS
  Filled 2016-12-19: qty 2

## 2016-12-19 MED ORDER — MIDAZOLAM HCL 2 MG/2ML IJ SOLN
INTRAMUSCULAR | Status: AC
  Filled 2016-12-19: qty 2

## 2016-12-19 MED ORDER — POLYETHYL GLYCOL-PROPYL GLYCOL 0.4-0.3 % OP SOLN: 1.0000 [drp] | Freq: Two times a day (BID) | OPHTHALMIC | Status: DC | PRN

## 2016-12-19 MED ORDER — LACTATED RINGERS IV SOLN
INTRAVENOUS | Status: DC
  Administered 2016-12-19: 15:00:00 via INTRAVENOUS

## 2016-12-19 MED ORDER — HYDROCODONE-ACETAMINOPHEN 5-325 MG PO TABS
1.0000 | ORAL_TABLET | Freq: Every day | ORAL | Status: DC
  Administered 2016-12-19 – 2016-12-20 (×2): 1 via ORAL
  Filled 2016-12-19 (×2): qty 1

## 2016-12-19 MED ORDER — POVIDONE-IODINE 10 % EX SWAB: 2.0000 "application " | Freq: Once | CUTANEOUS | Status: DC

## 2016-12-19 MED ORDER — ONDANSETRON HCL 4 MG/2ML IJ SOLN: 4.0000 mg | Freq: Four times a day (QID) | INTRAMUSCULAR | Status: DC | PRN

## 2016-12-19 MED ORDER — MELATONIN 3 MG PO TABS
1.0000 | ORAL_TABLET | Freq: Every day | ORAL | Status: DC
  Administered 2016-12-19 – 2016-12-20 (×2): 3 mg via ORAL
  Filled 2016-12-19 (×2): qty 1

## 2016-12-19 MED ORDER — ONDANSETRON HCL 4 MG PO TABS: 4.0000 mg | ORAL_TABLET | Freq: Four times a day (QID) | ORAL | Status: DC | PRN

## 2016-12-19 MED ORDER — MEMANTINE HCL 10 MG PO TABS
10.0000 mg | ORAL_TABLET | Freq: Two times a day (BID) | ORAL | Status: DC
  Administered 2016-12-19 – 2016-12-21 (×4): 10 mg via ORAL
  Filled 2016-12-19 (×4): qty 1

## 2016-12-19 MED ORDER — MORPHINE SULFATE (PF) 4 MG/ML IV SOLN: 0.5000 mg | INTRAVENOUS | Status: DC | PRN

## 2016-12-19 MED ORDER — HYDROCODONE-ACETAMINOPHEN 5-325 MG PO TABS
1.0000 | ORAL_TABLET | Freq: Four times a day (QID) | ORAL | Status: DC | PRN
  Administered 2016-12-20 – 2016-12-21 (×2): 2 via ORAL
  Filled 2016-12-19 (×3): qty 2

## 2016-12-19 MED ORDER — PROPOFOL 1000 MG/100ML IV EMUL
INTRAVENOUS | Status: AC
  Filled 2016-12-19: qty 100

## 2016-12-19 MED ORDER — FENTANYL CITRATE (PF) 100 MCG/2ML IJ SOLN: 25.0000 ug | INTRAMUSCULAR | Status: DC | PRN

## 2016-12-19 MED ORDER — FENTANYL CITRATE (PF) 250 MCG/5ML IJ SOLN
INTRAMUSCULAR | Status: AC
  Filled 2016-12-19: qty 5

## 2016-12-19 MED ORDER — SENNA 8.6 MG PO TABS
1.0000 | ORAL_TABLET | Freq: Two times a day (BID) | ORAL | Status: DC
  Administered 2016-12-19 – 2016-12-21 (×4): 8.6 mg via ORAL
  Filled 2016-12-19 (×4): qty 1

## 2016-12-19 MED ORDER — 0.9 % SODIUM CHLORIDE (POUR BTL) OPTIME
TOPICAL | Status: DC | PRN
  Administered 2016-12-19: 1000 mL

## 2016-12-19 MED ORDER — ESCITALOPRAM OXALATE 10 MG PO TABS
10.0000 mg | ORAL_TABLET | Freq: Every day | ORAL | Status: DC
  Administered 2016-12-20 – 2016-12-21 (×2): 10 mg via ORAL
  Filled 2016-12-19 (×2): qty 1

## 2016-12-19 MED ORDER — LOSARTAN POTASSIUM 50 MG PO TABS
50.0000 mg | ORAL_TABLET | Freq: Every day | ORAL | Status: DC
  Administered 2016-12-20 – 2016-12-21 (×2): 50 mg via ORAL
  Filled 2016-12-19 (×2): qty 1

## 2016-12-19 MED ORDER — SENNOSIDES 8.6 MG PO TABS: 1.0000 | ORAL_TABLET | Freq: Two times a day (BID) | ORAL | Status: DC

## 2016-12-19 MED ORDER — METOCLOPRAMIDE HCL 5 MG PO TABS: 5.0000 mg | ORAL_TABLET | Freq: Three times a day (TID) | ORAL | Status: DC | PRN

## 2016-12-19 MED ORDER — ENOXAPARIN SODIUM 30 MG/0.3ML ~~LOC~~ SOLN
30.0000 mg | SUBCUTANEOUS | Status: DC
  Administered 2016-12-20 – 2016-12-21 (×2): 30 mg via SUBCUTANEOUS
  Filled 2016-12-19 (×2): qty 0.3

## 2016-12-19 MED ORDER — OXYCODONE HCL 5 MG PO TABS
5.0000 mg | ORAL_TABLET | ORAL | Status: DC | PRN
  Administered 2016-12-20: 10 mg via ORAL
  Filled 2016-12-19: qty 2

## 2016-12-19 MED ORDER — SODIUM CHLORIDE 0.9 % IV SOLN
INTRAVENOUS | Status: DC
  Administered 2016-12-19: 18:00:00 via INTRAVENOUS

## 2016-12-19 MED ORDER — MENTHOL 3 MG MT LOZG: 1.0000 | LOZENGE | OROMUCOSAL | Status: DC | PRN

## 2016-12-19 MED ORDER — PROPOFOL 500 MG/50ML IV EMUL
INTRAVENOUS | Status: DC | PRN
  Administered 2016-12-19: 100 ug/kg/min via INTRAVENOUS

## 2016-12-19 MED ORDER — HYDROCODONE-ACETAMINOPHEN 5-325 MG PO TABS: 1.0000 | ORAL_TABLET | Freq: Four times a day (QID) | ORAL | 0 refills | Status: DC | PRN

## 2016-12-19 MED ORDER — FENTANYL CITRATE (PF) 100 MCG/2ML IJ SOLN
75.0000 ug | Freq: Once | INTRAMUSCULAR | Status: AC
  Administered 2016-12-19: 75 ug via INTRAVENOUS

## 2016-12-19 MED ORDER — PROPOFOL 10 MG/ML IV BOLUS
INTRAVENOUS | Status: DC | PRN
  Administered 2016-12-19: 30 mg via INTRAVENOUS
  Administered 2016-12-19: 10 mg via INTRAVENOUS

## 2016-12-19 MED ORDER — METOCLOPRAMIDE HCL 5 MG/ML IJ SOLN: 5.0000 mg | Freq: Three times a day (TID) | INTRAMUSCULAR | Status: DC | PRN

## 2016-12-19 MED ORDER — VANCOMYCIN HCL IN DEXTROSE 1-5 GM/200ML-% IV SOLN
1000.0000 mg | Freq: Two times a day (BID) | INTRAVENOUS | Status: AC
  Administered 2016-12-19: 1000 mg via INTRAVENOUS
  Filled 2016-12-19: qty 200

## 2016-12-19 MED ORDER — ALPRAZOLAM 0.5 MG PO TABS
0.5000 mg | ORAL_TABLET | Freq: Every day | ORAL | Status: DC
  Administered 2016-12-19 – 2016-12-20 (×2): 0.5 mg via ORAL
  Filled 2016-12-19 (×2): qty 1

## 2016-12-19 MED ORDER — DONEPEZIL HCL 10 MG PO TABS
10.0000 mg | ORAL_TABLET | Freq: Every day | ORAL | Status: DC
  Administered 2016-12-19 – 2016-12-20 (×2): 10 mg via ORAL
  Filled 2016-12-19 (×2): qty 1

## 2016-12-19 MED ORDER — MORPHINE SULFATE (PF) 2 MG/ML IV SOLN: 0.5000 mg | INTRAVENOUS | Status: DC | PRN

## 2016-12-19 MED ORDER — ENOXAPARIN SODIUM 30 MG/0.3ML ~~LOC~~ SOLN: 30.0000 mg | SUBCUTANEOUS | 0 refills | Status: DC

## 2016-12-19 SURGICAL SUPPLY — 64 items
BANDAGE ACE 4X5 VEL STRL LF (GAUZE/BANDAGES/DRESSINGS) ×3 IMPLANT
BANDAGE ACE 6X5 VEL STRL LF (GAUZE/BANDAGES/DRESSINGS) ×3 IMPLANT
BANDAGE ELASTIC 4 VELCRO ST LF (GAUZE/BANDAGES/DRESSINGS) ×3 IMPLANT
BANDAGE ELASTIC 6 VELCRO ST LF (GAUZE/BANDAGES/DRESSINGS) ×3 IMPLANT
BANDAGE ESMARK 6X9 LF (GAUZE/BANDAGES/DRESSINGS) ×1 IMPLANT
BIT DRILL AO GAMMA 4.2X130 (BIT) ×3 IMPLANT
BIT DRILL AO GAMMA 4.2X180 (BIT) ×3 IMPLANT
BIT DRILL AO GAMMA 4.2X340 (BIT) ×6 IMPLANT
BNDG ESMARK 6X9 LF (GAUZE/BANDAGES/DRESSINGS) ×3
CANISTER SUCT 3000ML PPV (MISCELLANEOUS) ×3 IMPLANT
COVER MAYO STAND STRL (DRAPES) ×3 IMPLANT
COVER SURGICAL LIGHT HANDLE (MISCELLANEOUS) ×3 IMPLANT
CUFF TOURNIQUET SINGLE 34IN LL (TOURNIQUET CUFF) ×3 IMPLANT
DRAPE C-ARM 42X72 X-RAY (DRAPES) ×3 IMPLANT
DRAPE C-ARMOR (DRAPES) ×3 IMPLANT
DRAPE HALF SHEET 40X57 (DRAPES) ×3 IMPLANT
DRAPE IMP U-DRAPE 54X76 (DRAPES) ×3 IMPLANT
DRAPE POUCH INSTRU U-SHP 10X18 (DRAPES) ×3 IMPLANT
DRAPE U-SHAPE 47X51 STRL (DRAPES) ×3 IMPLANT
DRAPE UTILITY XL STRL (DRAPES) ×6 IMPLANT
DRESSING ALLEVYN LIFE SACRUM (GAUZE/BANDAGES/DRESSINGS) ×3 IMPLANT
DRSG MEPILEX BORDER 4X8 (GAUZE/BANDAGES/DRESSINGS) ×9 IMPLANT
DURAPREP 26ML APPLICATOR (WOUND CARE) ×3 IMPLANT
ELECT CAUTERY BLADE 6.4 (BLADE) ×3 IMPLANT
ELECT REM PT RETURN 9FT ADLT (ELECTROSURGICAL) ×3
ELECTRODE REM PT RTRN 9FT ADLT (ELECTROSURGICAL) ×1 IMPLANT
FACESHIELD WRAPAROUND (MASK) IMPLANT
GAUZE SPONGE 4X4 12PLY STRL (GAUZE/BANDAGES/DRESSINGS) ×3 IMPLANT
GAUZE XEROFORM 1X8 LF (GAUZE/BANDAGES/DRESSINGS) ×9 IMPLANT
GLOVE SKINSENSE NS SZ7.5 (GLOVE) ×8
GLOVE SKINSENSE STRL SZ7.5 (GLOVE) ×4 IMPLANT
GOWN STRL REIN XL XLG (GOWN DISPOSABLE) ×3 IMPLANT
GUIDEROD T2 3X1000 (ROD) ×3 IMPLANT
GUIDEWIRE GAMMA (WIRE) ×6 IMPLANT
K-WIRE FIXATION 3X285 COATED (WIRE) ×6
KIT BASIN OR (CUSTOM PROCEDURE TRAY) ×3 IMPLANT
KWIRE FIXATION 3X285 COATED (WIRE) ×2 IMPLANT
MANIFOLD NEPTUNE II (INSTRUMENTS) IMPLANT
NAIL ELAS INSERT SLV SPI 8-11 (MISCELLANEOUS) ×3 IMPLANT
NAIL TIBIAL T2 STD 12X360MM (Nail) ×3 IMPLANT
NS IRRIG 1000ML POUR BTL (IV SOLUTION) ×3 IMPLANT
PACK TOTAL JOINT (CUSTOM PROCEDURE TRAY) ×3 IMPLANT
PACK UNIVERSAL I (CUSTOM PROCEDURE TRAY) IMPLANT
PAD CAST 4YDX4 CTTN HI CHSV (CAST SUPPLIES) ×1 IMPLANT
PADDING CAST COTTON 4X4 STRL (CAST SUPPLIES) ×2
PADDING CAST COTTON 6X4 STRL (CAST SUPPLIES) ×3 IMPLANT
REAMER INTRAMEDULLARY 8MM 510 (MISCELLANEOUS) ×3 IMPLANT
SCREW LOCKING FULL THREAD 5X52 (Screw) ×3 IMPLANT
SCREW LOCKING T2 F/T  5MMX35MM (Screw) ×2 IMPLANT
SCREW LOCKING T2 F/T  5MMX40MM (Screw) ×4 IMPLANT
SCREW LOCKING T2 F/T  5MMX55MM (Screw) ×2 IMPLANT
SCREW LOCKING T2 F/T  5X42.5MM (Screw) ×2 IMPLANT
SCREW LOCKING T2 F/T 5MMX35MM (Screw) ×1 IMPLANT
SCREW LOCKING T2 F/T 5MMX40MM (Screw) ×2 IMPLANT
SCREW LOCKING T2 F/T 5MMX55MM (Screw) ×1 IMPLANT
SCREW LOCKING T2 F/T 5X42.5MM (Screw) ×1 IMPLANT
SLEEVE NAIL INSERT SPI 8-13 (SLEEVE) ×3 IMPLANT
STAPLER SKIN PROX WIDE 3.9 (STAPLE) ×3 IMPLANT
SUT VIC AB 0 CT1 27 (SUTURE) ×2
SUT VIC AB 0 CT1 27XBRD ANTBC (SUTURE) ×1 IMPLANT
SUT VIC AB 2-0 CT1 27 (SUTURE) ×4
SUT VIC AB 2-0 CT1 TAPERPNT 27 (SUTURE) ×2 IMPLANT
TOWEL OR 17X26 10 PK STRL BLUE (TOWEL DISPOSABLE) ×6 IMPLANT
WATER STERILE IRR 1000ML POUR (IV SOLUTION) IMPLANT

## 2016-12-19 NOTE — Progress Notes (Signed)
PT Cancellation Note  Patient Details Name: Alexandra Booth MRN: [id number] DOB: July 09, 1923   Cancelled Treatment:    Reason Eval/Treat Not Completed: Medical issues which prohibited therapy (pt on bedrest with surgery today. Will plan to evaluate next date as appropriate)   Alexandra Booth 12/19/2016, 7:19 AM  Elwyn Reach, [place]

## 2016-12-19 NOTE — Consult Note (Signed)
ORTHOPAEDIC CONSULTATION  REQUESTING PHYSICIAN: Alexandra Hillock, MD  Chief Complaint: Left tib fib fx  HPI: Alexandra Booth is a 83 y.o. female who presents with left tib fib fx s/p mechanical fall at [facility].  She has h/o HTN, DM, CKD, dementia, anxiety, depression.  Denies LOC, neck pain, abd pain, chest pain.    Past Medical History:  Diagnosis Date  . Depression with anxiety   . GERD (gastroesophageal reflux disease)   . Hypothyroidism    Past Surgical History:  Procedure Laterality Date  . FEMUR IM NAIL Left 09/12/2014   Procedure: INTRAMEDULLARY (IM) NAIL FEMORAL;  Surgeon: Leandrew Koyanagi, MD;  Location: [facility];  Service: Orthopedics;  Laterality: Left;  . LAMINECTOMY     Social History   Social History  . Marital status: Widowed    Spouse name: N/A  . Number of children: N/A  . Years of education: N/A   Social History Main Topics  . Smoking status: Former Research scientist (life sciences)  . Smokeless tobacco: Never Used  . Alcohol use No  . Drug use: No  . Sexual activity: Not Asked   Other Topics Concern  . None   Social History Narrative   Lives alone, has home health aid that comes daily to assist with meal preparation.    Family History  Problem Relation Age of Onset  . Kidney disease Mother   . Kidney disease Father    - negative except otherwise stated in the family history section Allergies  Allergen Reactions  . Diazepam     REACTION: Unknown reaction  . Penicillins     REACTION: rash, itching  . Sulfonamide Derivatives     REACTION: GI upset   Prior to Admission medications   Medication Sig Start Date End Date Taking? Authorizing Provider  acetaminophen (TYLENOL) 325 MG tablet Take 2 tablets (650 mg total) by mouth every 6 (six) hours as needed for mild pain (or Fever >/= 101). Patient taking differently: Take 325 mg by mouth 2 (two) times daily.  09/19/14  Yes Rabbani, Ricarda Frame, MD  ALPRAZolam Duanne Moron) 0.5 MG tablet Take 0.5 mg by mouth at bedtime.   Yes [provider]  Cholecalciferol 1000 UNITS capsule Take 1,000 Units by mouth daily.   Yes [provider]  donepezil (ARICEPT) 10 MG tablet Take 10 mg by mouth at bedtime.   Yes [provider]  escitalopram (LEXAPRO) 10 MG tablet Take 10 mg by mouth daily.   Yes [provider]  HYDROcodone-acetaminophen (NORCO/VICODIN) 5-325 MG tablet Take 1 tablet by mouth at bedtime.   Yes [provider]  levothyroxine (SYNTHROID, LEVOTHROID) 25 MCG tablet Take 25 mcg by mouth daily before breakfast.   Yes [provider]  losartan (COZAAR) 50 MG tablet Take 50 mg by mouth daily.   Yes [provider]  Melatonin 3 MG TABS Take 1 tablet by mouth at bedtime.   Yes [provider]  memantine (NAMENDA) 10 MG tablet Take 10 mg by mouth 2 (two) times daily.   Yes [provider]  Menthol, Topical Analgesic, (BIOFREEZE EX) Apply 1 application topically 3 (three) times daily. 3% Apply to knees   Yes [provider]  Multiple Vitamins-Minerals (MULTIVITAMIN WITH MINERALS) tablet Take 1 tablet by mouth daily.   Yes [provider]  nystatin (NYSTATIN) powder Apply 1 Bottle topically 3 (three) times daily as needed (skin irritation).   Yes [provider]  omeprazole (PRILOSEC) 40 MG capsule Take 40 mg  by mouth 2 (two) times daily.   Yes [provider]  phenazopyridine (PYRIDIUM) 200 MG tablet Take 200 mg by mouth daily as needed for pain (painful urination).   Yes [provider]  Polyethyl Glycol-Propyl Glycol (SYSTANE) 0.4-0.3 % SOLN Apply 1 drop to eye 2 (two) times daily as needed (dry eyes).   Yes [provider]  senna (SENOKOT) 8.6 MG tablet Take 1 tablet by mouth 2 (two) times daily.   Yes [provider]  solifenacin (VESICARE) 5 MG tablet Take 5 mg by mouth daily.   Yes [provider]  vitamin B-12 (CYANOCOBALAMIN) 500 MCG tablet Take 500 mcg by mouth daily.   Yes  [provider]  aspirin 81 MG chewable tablet Chew 1 tablet (81 mg total) by mouth daily. 10/13/14   Juluis Mire, MD  calcium carbonate (OS-CAL - DOSED IN MG OF ELEMENTAL CALCIUM) 1250 (500 CA) MG tablet Take 1 tablet (500 mg of elemental calcium total) by mouth daily with breakfast. 09/19/14   Juluis Mire, MD  feeding supplement, ENSURE ENLIVE, (ENSURE ENLIVE) LIQD Take 237 mLs by mouth 2 (two) times daily between meals. 09/19/14   Juluis Mire, MD  mirtazapine (REMERON) 15 MG tablet Take 15 mg by mouth daily.    [provider]  oxyCODONE (OXY IR/ROXICODONE) 5 MG immediate release tablet Take 1 tablet (5 mg total) by mouth 2 (two) times daily as needed for breakthrough pain ((for MODERATE breakthrough pain)). 09/19/14   Juluis Mire, MD  polyethylene glycol (MIRALAX / GLYCOLAX) packet Take 17 g by mouth daily as needed. 09/19/14   Juluis Mire, MD  senna-docusate (SENOKOT-S) 8.6-50 MG per tablet Take 1 tablet by mouth daily. 09/19/14   Juluis Mire, MD  zolpidem (AMBIEN) 5 MG tablet Take 5 mg by mouth at bedtime.    [provider]   Dg Knee 2 Views Left  Result Date: 12/18/2016 CLINICAL DATA:  Ankle pain radiating to or at the knee EXAM: LEFT KNEE - 1-2 VIEW COMPARISON:  None. FINDINGS: In diffuse osteopenia. No evidence of fracture of the tibia. Oblique minimally displaced fracture of the proximal fibula with mild anterior displacement. Osteoarthritic changes of all 3 compartments of the knee joint. Partially visualized intramedullary rod within the left femur. IMPRESSION: Oblique minimally displaced fracture of the proximal fibula. Three compartment osteoarthritic changes of the knee. Osteopenia. Electronically Signed   By: Fidela Salisbury M.D.   On: 12/18/2016 17:46   Dg Ankle Complete Left  Result Date: 12/18/2016 CLINICAL DATA:  Fall. EXAM: LEFT ANKLE COMPLETE - 3+ VIEW COMPARISON:  None. FINDINGS: Set fractures are noted through the metadiaphysis  of the distal left tibia and fibula. Mild angulation at the fibular fractures. No subluxation or dislocation. IMPRESSION: Fractures within the distal left radial and fibular metadiaphysis. Electronically Signed   By: Rolm Baptise M.D.   On: 12/18/2016 17:45   Ct Head Wo Contrast  Result Date: 12/18/2016 CLINICAL DATA:  Fall EXAM: CT HEAD WITHOUT CONTRAST CT CERVICAL SPINE WITHOUT CONTRAST TECHNIQUE: Multidetector CT imaging of the head and cervical spine was performed following the standard protocol without intravenous contrast. Multiplanar CT image reconstructions of the cervical spine were also generated. COMPARISON:  CT head dated 09/11/2014 FINDINGS: CT HEAD FINDINGS Brain: No evidence of acute infarction, hemorrhage, hydrocephalus, extra-axial collection or mass lesion/mass effect. Global cortical atrophy. Subcortical white matter and periventricular small vessel ischemic changes. Vascular: Intracranial atherosclerosis. Skull: Normal. Negative for fracture or focal lesion. Sinuses/Orbits: The visualized paranasal sinuses  are essentially clear. The mastoid air cells are unopacified. Other: None. CT CERVICAL SPINE FINDINGS Alignment: Mild straightening of the cervical spine, likely positional. Skull base and vertebrae: No acute fracture. No primary bone lesion or focal pathologic process. Soft tissues and spinal canal: No prevertebral fluid or swelling. No visible canal hematoma. Disc levels:  Mild multilevel degenerative changes. Spinal canal is patent. Upper chest: Visualized lung apices are clear. Other: Visualized thyroid is grossly unremarkable. IMPRESSION: No evidence of acute intracranial abnormality. Atrophy with small vessel ischemic changes. No evidence of traumatic injury to the cervical spine. Mild multilevel degenerative changes. Electronically Signed   By: Julian Hy M.D.   On: 12/18/2016 19:06   Ct Cervical Spine Wo Contrast  Result Date: 12/18/2016 CLINICAL DATA:  Fall EXAM: CT HEAD  WITHOUT CONTRAST CT CERVICAL SPINE WITHOUT CONTRAST TECHNIQUE: Multidetector CT imaging of the head and cervical spine was performed following the standard protocol without intravenous contrast. Multiplanar CT image reconstructions of the cervical spine were also generated. COMPARISON:  CT head dated 09/11/2014 FINDINGS: CT HEAD FINDINGS Brain: No evidence of acute infarction, hemorrhage, hydrocephalus, extra-axial collection or mass lesion/mass effect. Global cortical atrophy. Subcortical white matter and periventricular small vessel ischemic changes. Vascular: Intracranial atherosclerosis. Skull: Normal. Negative for fracture or focal lesion. Sinuses/Orbits: The visualized paranasal sinuses are essentially clear. The mastoid air cells are unopacified. Other: None. CT CERVICAL SPINE FINDINGS Alignment: Mild straightening of the cervical spine, likely positional. Skull base and vertebrae: No acute fracture. No primary bone lesion or focal pathologic process. Soft tissues and spinal canal: No prevertebral fluid or swelling. No visible canal hematoma. Disc levels:  Mild multilevel degenerative changes. Spinal canal is patent. Upper chest: Visualized lung apices are clear. Other: Visualized thyroid is grossly unremarkable. IMPRESSION: No evidence of acute intracranial abnormality. Atrophy with small vessel ischemic changes. No evidence of traumatic injury to the cervical spine. Mild multilevel degenerative changes. Electronically Signed   By: Julian Hy M.D.   On: 12/18/2016 19:06   Ct Ankle Left Wo Contrast  Result Date: 12/18/2016 CLINICAL DATA:  83 year old female with fracture of the left tibia and fibula. Operative planning. EXAM: CT OF THE LEFT ANKLE WITHOUT CONTRAST TECHNIQUE: Multidetector CT imaging of the left ankle was performed according to the standard protocol. Multiplanar CT image reconstructions were also generated. COMPARISON:  Radiograph dated 12/18/2009 FINDINGS: Bones/Joint/Cartilage  There is an angulated and displaced comminuted fracture of the distal fibula. Approximately 50% shaft width anterior displacement of the distal fracture fragment. There is a comminuted oblique fracture of the distal tibial diaphysis with approximately 50% shaft width posterior and 6 mm lateral displacement of the distal fracture fragment. No intra-articular extension noted. There is advanced osteopenia. No dislocation. A the ankle mortise is intact. Ligaments Suboptimally assessed by CT. Muscles and Tendons No large intramuscular hematoma. Soft tissues Diffuse subcutaneous soft tissue edema and thickening. No fluid collection. IMPRESSION: 1. Comminuted and mildly displaced fractures of the distal tibia and fibular diaphysis. No dislocation. 2. Severe osteopenia. Electronically Signed   By: Anner Crete M.D.   On: 12/18/2016 21:20   - pertinent xrays, CT, MRI studies were reviewed and independently interpreted  Positive ROS: All other systems have been reviewed and were otherwise negative with the exception of those mentioned in the HPI and as above.  Physical Exam: General: Alert, no acute distress Cardiovascular: No pedal edema Respiratory: No cyanosis, no use of accessory musculature GI: No organomegaly, abdomen is soft and non-tender Skin: No lesions in the  area of chief complaint Neurologic: Sensation intact distally Psychiatric: Patient is slightly confused Lymphatic: No axillary or cervical lymphadenopathy  MUSCULOSKELETAL:  - toes wwp - well fitting splint - no pain with passive stretch  Assessment: Left tib-fib fx  Plan: - continue NPO - will plan for surgery this afternoon  Thank you for the consult and the opportunity to see [name]  N. Eduard Roux, MD [place] 7:23 AM

## 2016-12-19 NOTE — Progress Notes (Signed)
Pt arrived from PACU at 1815. VSS/NAD. BP stable. No complaints of pain. L leg warm, good cap refill, pulses palpable, dressing CDI. Aide at bedside, updated. Will continue to monitor.   Prescilla Sours, Therapist, sports

## 2016-12-19 NOTE — Progress Notes (Signed)
Initial Nutrition Assessment  DOCUMENTATION CODES:   Obesity unspecified  INTERVENTION:  Once diet advances, provide Ensure Enlive po BID, each supplement provides 350 kcal and 20 grams of protein.  NUTRITION DIAGNOSIS:   Increased nutrient needs related to  (post op healing) as evidenced by estimated needs.  GOAL:   Patient will meet greater than or equal to 90% of their needs  MONITOR:   Supplement acceptance, Diet advancement, Labs, Weight trends, Skin, I & O's  REASON FOR ASSESSMENT:   Consult Assessment of nutrition requirement/status  ASSESSMENT:   83 y.o. female with medical history significant of hypertension, hyperlipidemia, diet-controlled diabetes, GERD, hypothyroidism, depression, anxiety, carcinoid tumor, CKD-3, CAD, dementia, who presents with fall, and left ankle and left knee pain.  Pt is currently NPO for surgery today. Pt reports eating well PTA with usual consumption of at least 3 meals a day with Ensure BID. Pt does report having dysgeusia, however still eats at meals. Usual body weight unknown. RD to order Ensure once diet advances to aid in post op healing.   Pt with no observed significant fat or muscle mass loss.   Labs and medications reviewed.   Diet Order:  Diet NPO time specified Except for: Ice Chips, Sips with Meds  Skin:  Reviewed, no issues  Last BM:  7/10  Height:   Ht Readings from Last 1 Encounters:  12/18/16 5\' 9"  (1.753 m)    Weight:   Wt Readings from Last 1 Encounters:  12/18/16 210 lb (95.3 kg)    Ideal Body Weight:  63.6 kg  BMI:  Body mass index is 31.01 kg/m.  Estimated Nutritional Needs:   Kcal:  1700-1850  Protein:  75-85 grams  Fluid:  1.7 - 1.8 L/day  EDUCATION NEEDS:   Education needs addressed  Corrin Parker, MS, RD, LDN Pager # [phone] After hours/ weekend pager # [phone]

## 2016-12-19 NOTE — H&P (Signed)
[facility]  PROGRESS NOTE  Tondra Reierson Vanvranken QJJ:[id number] DOB: Apr 07, 1924 DOA: 12/18/2016 PCP: Janifer Adie, MD   Brief HPI:   83 y.o. female with medical history significant of hypertension, hyperlipidemia, diet-controlled diabetes, GERD, hypothyroidism, depression, anxiety, carcinoid tumor, CKD-3, CAD, dementia, who presents with fall, and left ankle and left knee pain. Patient found to have closed left ankle fracture with fracture of left proximal tibia/fibula due to mechanical fall.    Subjective   This morning patient denies any pain. No chest pain or shortness of breath.   Assessment/Plan:     1. Closed left ankle fracture, and left proximal tibia-fibula fracture- orthopedic surgery was consulted. Plan for surgery today. 2. Hypothyroidism-last TSH was 0.406 on 09/11/2014. Continue Synthroid. 3. Hypertension- blood pressure stable. Continue IV hydralazine when necessary 4. History of CAD- no chest pain, continue aspirin. 5. GERD-continue Protonix. 6. Depression and anxiety-stable, continue Lexapro, Remeron, Xanax. 7. Diabetes mellitus-last A1c was 5.6 on 09/11/2014. Patient is not taking medication at home. 8. Chronic kidney disease stage III- A sheand creatinine is 1.7-2.0. Today creatinine 1.47. Follow BMP. 9. Macrocytic anemia- hemoglobin is stable at 10.1.    DVT prophylaxis: SCDs  Code Status: DO NOT RESUSCITATE  Family Communication: No family at bedside   Disposition Plan: Likely skilled nursing facility after surgery   Consultants:  Orthopedic surgery  Procedures:  None  Continuous infusions . sodium chloride 75 mL/hr at 12/19/16 1140  . clindamycin (CLEOCIN) IV        Antibiotics:   Anti-infectives    Start     Dose/Rate Route Frequency Ordered Stop   12/19/16 1400  clindamycin (CLEOCIN) IVPB 900 mg     900 mg 100 mL/hr over 30 Minutes Intravenous To Short Stay 12/19/16 0729 12/20/16 1400       Objective   Vitals:    12/18/16 2051 12/18/16 2100 12/18/16 2149 12/19/16 0613  BP: (!) 157/55 (!) 165/66 (!) 168/55 (!) 162/61  Pulse: 66 69 65 70  Resp: 16 17 16 17   Temp: 98.5 F (36.9 C)  97.8 F (36.6 C) 98.3 F (36.8 C)  TempSrc:   Oral Axillary  SpO2: 95% 95% 98% 95%  Weight:      Height:        Intake/Output Summary (Last 24 hours) at 12/19/16 1350 Last data filed at 12/19/16 0900  Gross per 24 hour  Intake                0 ml  Output              600 ml  Net             -600 ml   Filed Weights   12/18/16 1648  Weight: 95.3 kg (210 lb)     Physical Examination:   Physical Exam: Eyes: No icterus, extraocular muscles intact  Mouth: Oral mucosa is moist, no lesions on palate,  Neck: Supple, no deformities, masses, or tenderness Lungs: Normal respiratory effort, bilateral clear to auscultation, no crackles or wheezes.  Heart: Regular rate and rhythm, S1 and S2 normal, no murmurs, rubs auscultated Abdomen: Left lower extremity in splint Extremities: No pretibial edema, no erythema, no cyanosis, no clubbing Neuro : Alert and oriented to time, place and person, No focal deficits     Data Reviewed: I have personally reviewed following labs and imaging studies  CBG: No results for input(s): GLUCAP in the last 168 hours.  CBC:  Recent Labs Lab 12/18/16 1956  WBC 6.8  NEUTROABS 4.2  HGB 10.1*  HCT 30.1*  MCV 99.0  PLT 875    Basic Metabolic Panel:  Recent Labs Lab 12/18/16 1956  NA 140  K 4.4  CL 107  CO2 26  GLUCOSE 103*  BUN 21*  CREATININE 1.47*  CALCIUM 8.8*    Recent Results (from the past 240 hour(s))  Surgical pcr screen     Status: None   Collection Time: 12/19/16  6:14 AM  Result Value Ref Range Status   MRSA, PCR NEGATIVE NEGATIVE Final   Staphylococcus aureus NEGATIVE NEGATIVE Final    Comment:        The Xpert SA Assay (FDA approved for NASAL specimens in patients over 69 years of age), is one component of a comprehensive surveillance program.   Test performance has been validated by [facility] for patients greater than or equal to 47 year old. It is not intended to diagnose infection nor to guide or monitor treatment.      Liver Function Tests:  Recent Labs Lab 12/18/16 1956  AST 17  ALT 10*  ALKPHOS 79  BILITOT 0.7  PROT 6.3*  ALBUMIN 3.1*   No results for input(s): LIPASE, AMYLASE in the last 168 hours. No results for input(s): AMMONIA in the last 168 hours.  Cardiac Enzymes: No results for input(s): CKTOTAL, CKMB, CKMBINDEX, TROPONINI in the last 168 hours. BNP (last 3 results) No results for input(s): BNP in the last 8760 hours.  ProBNP (last 3 results) No results for input(s): PROBNP in the last 8760 hours.    Studies: Dg Knee 2 Views Left  Result Date: 12/18/2016 CLINICAL DATA:  Ankle pain radiating to or at the knee EXAM: LEFT KNEE - 1-2 VIEW COMPARISON:  None. FINDINGS: In diffuse osteopenia. No evidence of fracture of the tibia. Oblique minimally displaced fracture of the proximal fibula with mild anterior displacement. Osteoarthritic changes of all 3 compartments of the knee joint. Partially visualized intramedullary rod within the left femur. IMPRESSION: Oblique minimally displaced fracture of the proximal fibula. Three compartment osteoarthritic changes of the knee. Osteopenia. Electronically Signed   By: Fidela Salisbury M.D.   On: 12/18/2016 17:46   Dg Ankle Complete Left  Result Date: 12/18/2016 CLINICAL DATA:  Fall. EXAM: LEFT ANKLE COMPLETE - 3+ VIEW COMPARISON:  None. FINDINGS: Set fractures are noted through the metadiaphysis of the distal left tibia and fibula. Mild angulation at the fibular fractures. No subluxation or dislocation. IMPRESSION: Fractures within the distal left radial and fibular metadiaphysis. Electronically Signed   By: Rolm Baptise M.D.   On: 12/18/2016 17:45   Ct Head Wo Contrast  Result Date: 12/18/2016 CLINICAL DATA:  Fall EXAM: CT HEAD WITHOUT CONTRAST CT CERVICAL  SPINE WITHOUT CONTRAST TECHNIQUE: Multidetector CT imaging of the head and cervical spine was performed following the standard protocol without intravenous contrast. Multiplanar CT image reconstructions of the cervical spine were also generated. COMPARISON:  CT head dated 09/11/2014 FINDINGS: CT HEAD FINDINGS Brain: No evidence of acute infarction, hemorrhage, hydrocephalus, extra-axial collection or mass lesion/mass effect. Global cortical atrophy. Subcortical white matter and periventricular small vessel ischemic changes. Vascular: Intracranial atherosclerosis. Skull: Normal. Negative for fracture or focal lesion. Sinuses/Orbits: The visualized paranasal sinuses are essentially clear. The mastoid air cells are unopacified. Other: None. CT CERVICAL SPINE FINDINGS Alignment: Mild straightening of the cervical spine, likely positional. Skull base and vertebrae: No acute fracture. No primary bone lesion or focal pathologic process. Soft tissues and spinal canal: No prevertebral  fluid or swelling. No visible canal hematoma. Disc levels:  Mild multilevel degenerative changes. Spinal canal is patent. Upper chest: Visualized lung apices are clear. Other: Visualized thyroid is grossly unremarkable. IMPRESSION: No evidence of acute intracranial abnormality. Atrophy with small vessel ischemic changes. No evidence of traumatic injury to the cervical spine. Mild multilevel degenerative changes. Electronically Signed   By: Julian Hy M.D.   On: 12/18/2016 19:06   Ct Cervical Spine Wo Contrast  Result Date: 12/18/2016 CLINICAL DATA:  Fall EXAM: CT HEAD WITHOUT CONTRAST CT CERVICAL SPINE WITHOUT CONTRAST TECHNIQUE: Multidetector CT imaging of the head and cervical spine was performed following the standard protocol without intravenous contrast. Multiplanar CT image reconstructions of the cervical spine were also generated. COMPARISON:  CT head dated 09/11/2014 FINDINGS: CT HEAD FINDINGS Brain: No evidence of acute  infarction, hemorrhage, hydrocephalus, extra-axial collection or mass lesion/mass effect. Global cortical atrophy. Subcortical white matter and periventricular small vessel ischemic changes. Vascular: Intracranial atherosclerosis. Skull: Normal. Negative for fracture or focal lesion. Sinuses/Orbits: The visualized paranasal sinuses are essentially clear. The mastoid air cells are unopacified. Other: None. CT CERVICAL SPINE FINDINGS Alignment: Mild straightening of the cervical spine, likely positional. Skull base and vertebrae: No acute fracture. No primary bone lesion or focal pathologic process. Soft tissues and spinal canal: No prevertebral fluid or swelling. No visible canal hematoma. Disc levels:  Mild multilevel degenerative changes. Spinal canal is patent. Upper chest: Visualized lung apices are clear. Other: Visualized thyroid is grossly unremarkable. IMPRESSION: No evidence of acute intracranial abnormality. Atrophy with small vessel ischemic changes. No evidence of traumatic injury to the cervical spine. Mild multilevel degenerative changes. Electronically Signed   By: Julian Hy M.D.   On: 12/18/2016 19:06   Ct Ankle Left Wo Contrast  Result Date: 12/18/2016 CLINICAL DATA:  83 year old female with fracture of the left tibia and fibula. Operative planning. EXAM: CT OF THE LEFT ANKLE WITHOUT CONTRAST TECHNIQUE: Multidetector CT imaging of the left ankle was performed according to the standard protocol. Multiplanar CT image reconstructions were also generated. COMPARISON:  Radiograph dated 12/18/2009 FINDINGS: Bones/Joint/Cartilage There is an angulated and displaced comminuted fracture of the distal fibula. Approximately 50% shaft width anterior displacement of the distal fracture fragment. There is a comminuted oblique fracture of the distal tibial diaphysis with approximately 50% shaft width posterior and 6 mm lateral displacement of the distal fracture fragment. No intra-articular extension  noted. There is advanced osteopenia. No dislocation. A the ankle mortise is intact. Ligaments Suboptimally assessed by CT. Muscles and Tendons No large intramuscular hematoma. Soft tissues Diffuse subcutaneous soft tissue edema and thickening. No fluid collection. IMPRESSION: 1. Comminuted and mildly displaced fractures of the distal tibia and fibular diaphysis. No dislocation. 2. Severe osteopenia. Electronically Signed   By: Anner Crete M.D.   On: 12/18/2016 21:20    Scheduled Meds: . aspirin  81 mg Oral Daily  . calcium carbonate  1 tablet Oral Q breakfast  . cholecalciferol  1,000 Units Oral Daily  . darifenacin  7.5 mg Oral Daily  . [START ON 12/20/2016] feeding supplement (ENSURE ENLIVE)  237 mL Oral BID BM  . levothyroxine  25 mcg Oral QAC breakfast  . mirtazapine  15 mg Oral Daily  . multivitamin with minerals  1 tablet Oral Daily  . pantoprazole  40 mg Oral Daily  . povidone-iodine  2 application Topical Once  . senna-docusate  1 tablet Oral Daily  . zolpidem  5 mg Oral QHS  Time spent: 25 min  [facility] Pager [phone]. If 7PM-7AM, please contact night-coverage at [email or web address], Office  [phone]  password [email or web address]  12/19/2016, 1:50 PM  LOS: 1 day

## 2016-12-19 NOTE — Transfer of Care (Signed)
Immediate Anesthesia Transfer of Care Note  Patient: Alexandra Booth  Procedure(s) Performed: Procedure(s): INTRAMEDULLARY (IM) NAIL TIBIAL (Left)  Patient Location: PACU  Anesthesia Type:MAC combined with regional for post-op pain  Level of Consciousness: awake, oriented and patient cooperative  Airway & Oxygen Therapy: Patient Spontanous Breathing and Patient connected to nasal cannula oxygen  Post-op Assessment: Report given to RN and Post -op Vital signs reviewed and stable  Post vital signs: Reviewed and stable  Last Vitals:  Vitals:   12/19/16 1500 12/19/16 1504  BP:  (!) 158/77  Pulse:    Resp: (!) 24 18  Temp:      Last Pain:  Vitals:   12/19/16 1504  TempSrc:   PainSc: 0-No pain      Patients Stated Pain Goal: 5 (65/78/46 9629)  Complications: No apparent anesthesia complications

## 2016-12-19 NOTE — Progress Notes (Signed)
Orthopedic Tech Progress Note Patient Details:  Alexandra Booth 1923/09/10 [id number]  Ortho Devices Type of Ortho Device: CAM walker Ortho Device/Splint Location: dropped off Cam Walker to OR department as requested size Large.  Ortho Device/Splint Interventions: Application   [name] 12/19/2016, 2:43 PM

## 2016-12-19 NOTE — Op Note (Signed)
Date of Surgery: 12/19/2016  INDICATIONS: [name] is a 83 y.o.-year-old female who was involved in a mechanical fall and sustained a left tibia fracture. The risks and benefits of the procedure discussed with the patient prior to the procedure and all questions were answered; consent was obtained.  PREOPERATIVE DIAGNOSIS:  left tibia fracture  POSTOPERATIVE DIAGNOSIS: Same  PROCEDURE:   1. left tibia closed reduction and intramedullary nailing CPT: 01093  2. closed treatment of left fibular shaft fracture with manipulation, CPT - 23557  SURGEON: N. Eduard Roux, M.D.  ASSISTANT: Laure Kidney, RNFA .  ANESTHESIA:  Regional blocks  IV FLUIDS AND URINE: See anesthesia record.  ESTIMATED BLOOD LOSS: minimal mL.  IMPLANTS: Stryker 12 x 36   DRAINS: None.  COMPLICATIONS: None.  DESCRIPTION OF PROCEDURE: The patient was brought to the operating room and placed supine on the operating table.  The patient's leg had been signed prior to the procedure.  The patient had the anesthesia placed by the anesthesiologist.  The prep verification and incision time-outs were performed to confirm that this was the correct patient, site, side and location. The patient had an SCD on the opposite lower extremity. The patient did receive antibiotics prior to the incision and was re-dosed during the procedure as needed at indicated intervals.  The patient had the lower extremity prepped and draped in the standard surgical fashion.  The incision was first made over the quadriceps tendon in the midline and taken down to the skin and subcutaneous tissue to expose the peritenon. The peritenon was incised in line with the skin incision and then a poke hole was made in the quadriceps tendon in the midline. A knife was then used to longitudinally divide the tendon in line with its fibers, taking care not to cross over any fibers. The guide wire was placed at the proximal, anterior tibia, confirming its location  on both AP and lateral views. The wire was drilled into the bone and then the opening reamer was placed over this and maneuvered so that the reamer was parallel with anterior cortex of the tibia. The ball-tipped guide wire was then placed down into the canal towards the fracture site. The fracture was reduced and the wire was passed and confirmed to be in the proper location on both AP and lateral views.  The measuring stick was used to measure the length of the nail.  Sequential reaming was then performed, then the nail was gently hammered into place over the guide wire and the guide wire was removed. The proximal screws were placed through the interlocking drill guide using the sleeve. The distal screws were placed using the perfect circles technique. All screws were placed in the standard fashion, first incising the skin and then spreading with a tonsil, then drilling, measuring with a depth gauge, and then placing the screws by hand. The final x-rays were taken in both AP and lateral views to confirm the fracture reduction as well as the placement of all hardware. The fibula fracture was treated in a closed manner.  The wounds were copiously irrigated with saline and then the peritenon was closed with 0 Vicryl figure-of-eight interrupted sutures. 2.0 vicryl was used to close the subcutaneous layer.  Staples were then used to close all of the open incision wounds.  The wounds were cleaned and dried a final time and a sterile dressing was placed. The patient was then placed in a short leg splint in neutral ankle dorsiflexion. The patient's calf  was soft to palpation at the end of the case.  The patient was then transferred to a bed and taken to the recovery room in stable condition.  All counts were correct at the end of the case.  POSTOPERATIVE PLAN: [name] will be NWB and will return 2 weeks for suture removal.  [name] will receive DVT prophylaxis.  Alexandra Cecil, MD [place] 4:57 PM

## 2016-12-19 NOTE — Plan of Care (Signed)
Problem: Safety: Goal: Ability to remain free from injury will improve Outcome: Progressing Pt will be free from falls and injuries during this hospitalization.  Problem: Pain Management: Goal: Pain level will decrease with appropriate interventions Outcome: Progressing Pt will be at goal tolerance pain level prior to discharge.

## 2016-12-19 NOTE — Anesthesia Preprocedure Evaluation (Signed)
Anesthesia Evaluation  Patient identified by MRN, date of birth, ID band Patient awake    Reviewed: Allergy & Precautions, NPO status , Patient's Chart, lab work & pertinent test results  History of Anesthesia Complications Negative for: history of anesthetic complications  Airway Mallampati: II  TM Distance: >3 FB Neck ROM: Full    Dental  (+) Edentulous Upper, Edentulous Lower   Pulmonary neg shortness of breath, neg COPD, neg recent URI, former smoker,    breath sounds clear to auscultation       Cardiovascular hypertension, Pt. on medications (-) angina Rhythm:Regular Rate:Normal  a stress nuclear study in August 2014 was normal with no evidence of ischemia.   Neuro/Psych PSYCHIATRIC DISORDERS Depression negative neurological ROS  negative psych ROS   GI/Hepatic Neg liver ROS, GERD  Medicated and Controlled,  Endo/Other  diabetesHypothyroidism   Renal/GU Renal InsufficiencyRenal disease  negative genitourinary   Musculoskeletal negative musculoskeletal ROS (+)   Abdominal   Peds negative pediatric ROS (+)  Hematology  (+) anemia ,   Anesthesia Other Findings   Reproductive/Obstetrics negative OB ROS                             Anesthesia Physical Anesthesia Plan  ASA: III  Anesthesia Plan: MAC and Regional   Post-op Pain Management:    Induction: Intravenous  PONV Risk Score and Plan: 2 and Ondansetron  Airway Management Planned: Nasal Cannula  Additional Equipment: None  Intra-op Plan:   Post-operative Plan:   Informed Consent: I have reviewed the patients History and Physical, chart, labs and discussed the procedure including the risks, benefits and alternatives for the proposed anesthesia with the patient or authorized representative who has indicated his/her understanding and acceptance.   Dental advisory given  Plan Discussed with: CRNA and Surgeon  Anesthesia  Plan Comments:         Anesthesia Quick Evaluation

## 2016-12-20 LAB — BASIC METABOLIC PANEL
ANION GAP: 6 (ref 5–15)
BUN: 18 mg/dL (ref 6–20)
CALCIUM: 8 mg/dL — AB (ref 8.9–10.3)
CO2: 23 mmol/L (ref 22–32)
Chloride: 108 mmol/L (ref 101–111)
Creatinine, Ser: 1.53 mg/dL — ABNORMAL HIGH (ref 0.44–1.00)
GFR, EST AFRICAN AMERICAN: 33 mL/min — AB (ref >60)
GFR, EST NON AFRICAN AMERICAN: 29 mL/min — AB (ref >60)
GLUCOSE: 122 mg/dL — AB (ref 65–99)
POTASSIUM: 4.7 mmol/L (ref 3.5–5.1)
SODIUM: 137 mmol/L (ref 135–145)

## 2016-12-20 LAB — CBC
HCT: 26.9 % — ABNORMAL LOW (ref 36.0–46.0)
Hemoglobin: 8.5 g/dL — ABNORMAL LOW (ref 12.0–15.0)
MCH: 32.9 pg (ref 26.0–34.0)
MCHC: 31.6 g/dL (ref 30.0–36.0)
MCV: 104.3 fL — AB (ref 78.0–100.0)
PLATELETS: 179 10*3/uL (ref 150–400)
RBC: 2.58 MIL/uL — AB (ref 3.87–5.11)
RDW: 12.2 % (ref 11.5–15.5)
WBC: 7.2 10*3/uL (ref 4.0–10.5)

## 2016-12-20 NOTE — Progress Notes (Signed)
Pt will have bed available at [facility] when stable for DC.  Facility can accept over the weekend if stable for [place], [place] Social Worker [phone]

## 2016-12-20 NOTE — Progress Notes (Signed)
   Subjective:  Patient reports pain as moderate.    Objective:   VITALS:   Vitals:   12/19/16 1839 12/19/16 2036 12/20/16 0046 12/20/16 0531  BP: (!) 161/55 (!) 164/47 (!) 107/35 (!) 116/40  Pulse: 73 68 69 77  Resp: 16 18 18 18   Temp: 97.8 F (36.6 C) 99.3 F (37.4 C) 99.7 F (37.6 C) 99.9 F (37.7 C)  TempSrc: Oral Oral Oral Oral  SpO2: 100% 96% 97% 98%  Weight:      Height:        Neurologically intact Neurovascular intact Sensation intact distally Intact pulses distally Dorsiflexion/Plantar flexion intact Incision: dressing C/D/I and no drainage No cellulitis present Compartment soft   Lab Results  Component Value Date   WBC 7.2 12/20/2016   HGB 8.5 (L) 12/20/2016   HCT 26.9 (L) 12/20/2016   MCV 104.3 (H) 12/20/2016   PLT 179 12/20/2016     Assessment/Plan:  1 Day Post-Op   - Expected postop acute blood loss anemia - will monitor for symptoms - Up with PT/OT - DVT ppx - SCDs, ambulation, lovenox - NWB operative extremity - Pain control   Eduard Roux 12/20/2016, 7:34 AM [phone]

## 2016-12-20 NOTE — Progress Notes (Signed)
OT Cancellation Note  Patient Details Name: Alexandra Booth MRN: [id number] DOB: [birth year]   Cancelled Treatment:    Reason Eval/Treat Not Completed: Other (comment) Pt is [facility] and they have already OK'd for pt to D/C to SNF once medically stable--will defer OT eval to that facility. No apparent immediate acute care OT needs, therefore will defer OT to SNF. If OT eval is needed please call Acute Rehab Dept. at [phone] and/or text page OT at [phone].    Almon Register [phone] 12/20/2016, 3:06 PM

## 2016-12-20 NOTE — Anesthesia Postprocedure Evaluation (Signed)
Anesthesia Post Note  Patient: Alexandra Booth  Procedure(s) Performed: Procedure(s) (LRB): INTRAMEDULLARY (IM) NAIL TIBIAL (Left)     Patient location during evaluation: PACU Anesthesia Type: Regional and MAC Level of consciousness: awake and alert Pain management: pain level controlled Vital Signs Assessment: post-procedure vital signs reviewed and stable Respiratory status: spontaneous breathing, nonlabored ventilation, respiratory function stable and patient connected to nasal cannula oxygen Cardiovascular status: stable and blood pressure returned to baseline Anesthetic complications: no    Last Vitals:  Vitals:   12/20/16 0046 12/20/16 0531  BP: (!) 107/35 (!) 116/40  Pulse: 69 77  Resp: 18 18  Temp: 37.6 C 37.7 C    Last Pain:  Vitals:   12/20/16 0531  TempSrc: Oral  PainSc:                  [name]

## 2016-12-20 NOTE — Progress Notes (Addendum)
[facility]  PROGRESS NOTE  Alexandra Booth DTO:[id number] DOB: 1923/07/03 DOA: 12/18/2016 PCP: Janifer Adie, MD   Brief HPI:   83 y.o. female with medical history significant of hypertension, hyperlipidemia, diet-controlled diabetes, GERD, hypothyroidism, depression, anxiety, carcinoid tumor, CKD-3, CAD, dementia, who presents with fall, and left ankle and left knee pain. Patient found to have closed left ankle fracture with fracture of left proximal tibia/fibula due to mechanical fall.    Subjective   Patient seen and examined, status post left tibia closed reduction and intramedullary nailing. Closed treatment of left fibular shaft fractures with manipulation.   Assessment/Plan:     1. Closed left ankle fracture, and left proximal tibia-fibula fracture- orthopedic surgery was consulted,Patient underwent surgery yesterday. Stable today. 2. Hypothyroidism-last TSH was 0.406 on 09/11/2014. Continue Synthroid. 3. Anemia-postoperative, likely  blood loss, hemoglobin is 8.5 today. Will follow CBC in a.m. 4. Hypertension- blood pressure stable. Continue IV hydralazine when necessary 5. History of CAD- no chest pain, continue aspirin. 6. GERD-continue Protonix. 7. Depression and anxiety-stable, continue Lexapro, Remeron, Xanax. 8. Diabetes mellitus-last A1c was 5.6 on 09/11/2014. Patient is not taking medication at home. 9. Chronic kidney disease stage III- baseline creatinine is 1.7-2.0. Today creatinine 153. Follow BMP in a.m. 10. Macrocytic anemia- hemoglobin is stable at 10.1.    DVT prophylaxis: SCDs  Code Status: DO NOT RESUSCITATE  Family Communication: No family at bedside   Disposition Plan: skilled nursing facility    Consultants:  Orthopedic surgery  Procedures:  None  Continuous infusions . sodium chloride 75 mL/hr at 12/19/16 1140  . sodium chloride 125 mL/hr at 12/19/16 1817  . lactated ringers 10 mL/hr at 12/19/16 1435  . methocarbamol  (ROBAXIN)  IV        Antibiotics:   Anti-infectives    Start     Dose/Rate Route Frequency Ordered Stop   12/19/16 2200  vancomycin (VANCOCIN) IVPB 1000 mg/200 mL premix     1,000 mg 200 mL/hr over 60 Minutes Intravenous Every 12 hours 12/19/16 1812 12/19/16 2325   12/19/16 1400  clindamycin (CLEOCIN) IVPB 900 mg     900 mg 100 mL/hr over 30 Minutes Intravenous To Short Stay 12/19/16 0729 12/19/16 1535       Objective   Vitals:   12/19/16 1839 12/19/16 2036 12/20/16 0046 12/20/16 0531  BP: (!) 161/55 (!) 164/47 (!) 107/35 (!) 116/40  Pulse: 73 68 69 77  Resp: 16 18 18 18   Temp: 97.8 F (36.6 C) 99.3 F (37.4 C) 99.7 F (37.6 C) 99.9 F (37.7 C)  TempSrc: Oral Oral Oral Oral  SpO2: 100% 96% 97% 98%  Weight:      Height:        Intake/Output Summary (Last 24 hours) at 12/20/16 1337 Last data filed at 12/20/16 0535  Gross per 24 hour  Intake             1570 ml  Output              505 ml  Net             1065 ml   Filed Weights   12/18/16 1648 12/19/16 1434  Weight: 95.3 kg (210 lb) 95.3 kg (210 lb)     Physical Examination:   Physical Exam: Eyes: No icterus, extraocular muscles intact  Mouth: Oral mucosa is moist, no lesions on palate,  Neck: Supple, no deformities, masses, or tenderness Lungs: Normal respiratory effort, bilateral clear to auscultation, no crackles or  wheezes.  Heart: Regular rate and rhythm, S1 and S2 normal, no murmurs, rubs auscultated Abdomen: BS normoactive,soft,nondistended,non-tender to palpation,no organomegaly Extremities: Left lower extremity in dressing Neuro : Alert and oriented to time, place and person, No focal deficits Skin: No rashes seen on exam     Data Reviewed: I have personally reviewed following labs and imaging studies  CBG: No results for input(s): GLUCAP in the last 168 hours.  CBC:  Recent Labs Lab 12/18/16 1956 12/19/16 1918 12/20/16 0253  WBC 6.8 8.7 7.2  NEUTROABS 4.2  --   --   HGB 10.1*  9.6* 8.5*  HCT 30.1* 30.8* 26.9*  MCV 99.0 104.4* 104.3*  PLT 185 200 235    Basic Metabolic Panel:  Recent Labs Lab 12/18/16 1956 12/19/16 1918 12/20/16 0253  NA 140  --  137  K 4.4  --  4.7  CL 107  --  108  CO2 26  --  23  GLUCOSE 103*  --  122*  BUN 21*  --  18  CREATININE 1.47* 1.44* 1.53*  CALCIUM 8.8*  --  8.0*    Recent Results (from the past 240 hour(s))  Surgical pcr screen     Status: None   Collection Time: 12/19/16  6:14 AM  Result Value Ref Range Status   MRSA, PCR NEGATIVE NEGATIVE Final   Staphylococcus aureus NEGATIVE NEGATIVE Final    Comment:        The Xpert SA Assay (FDA approved for NASAL specimens in patients over 11 years of age), is one component of a comprehensive surveillance program.  Test performance has been validated by [facility] for patients greater than or equal to 77 year old. It is not intended to diagnose infection nor to guide or monitor treatment.      Liver Function Tests:  Recent Labs Lab 12/18/16 1956  AST 17  ALT 10*  ALKPHOS 79  BILITOT 0.7  PROT 6.3*  ALBUMIN 3.1*   No results for input(s): LIPASE, AMYLASE in the last 168 hours. No results for input(s): AMMONIA in the last 168 hours.  Cardiac Enzymes: No results for input(s): CKTOTAL, CKMB, CKMBINDEX, TROPONINI in the last 168 hours. BNP (last 3 results) No results for input(s): BNP in the last 8760 hours.  ProBNP (last 3 results) No results for input(s): PROBNP in the last 8760 hours.    Studies: Dg Knee 2 Views Left  Result Date: 12/18/2016 CLINICAL DATA:  Ankle pain radiating to or at the knee EXAM: LEFT KNEE - 1-2 VIEW COMPARISON:  None. FINDINGS: In diffuse osteopenia. No evidence of fracture of the tibia. Oblique minimally displaced fracture of the proximal fibula with mild anterior displacement. Osteoarthritic changes of all 3 compartments of the knee joint. Partially visualized intramedullary rod within the left femur. IMPRESSION: Oblique  minimally displaced fracture of the proximal fibula. Three compartment osteoarthritic changes of the knee. Osteopenia. Electronically Signed   By: Fidela Salisbury M.D.   On: 12/18/2016 17:46   Dg Tibia/fibula Left  Result Date: 12/19/2016 CLINICAL DATA:  Intramedullary rod placement. FLUOROSCOPY TIME:  3 minutes. Images: 5 EXAM: LEFT TIBIA AND FIBULA - 2 VIEW; DG C-ARM 61-120 MIN COMPARISON:  None. FINDINGS: An intramedullary rod has been placed across the distal tibial diaphysis fracture with interlocking screws seen superiorly and inferiorly. A comminuted fibular fracture remains. IMPRESSION: Tibial fracture repair as above. Electronically Signed   By: Dorise Bullion III M.D   On: 12/19/2016 17:18   Dg Ankle Complete Left  Result Date: 12/18/2016 CLINICAL DATA:  Fall. EXAM: LEFT ANKLE COMPLETE - 3+ VIEW COMPARISON:  None. FINDINGS: Set fractures are noted through the metadiaphysis of the distal left tibia and fibula. Mild angulation at the fibular fractures. No subluxation or dislocation. IMPRESSION: Fractures within the distal left radial and fibular metadiaphysis. Electronically Signed   By: Rolm Baptise M.D.   On: 12/18/2016 17:45   Ct Head Wo Contrast  Result Date: 12/18/2016 CLINICAL DATA:  Fall EXAM: CT HEAD WITHOUT CONTRAST CT CERVICAL SPINE WITHOUT CONTRAST TECHNIQUE: Multidetector CT imaging of the head and cervical spine was performed following the standard protocol without intravenous contrast. Multiplanar CT image reconstructions of the cervical spine were also generated. COMPARISON:  CT head dated 09/11/2014 FINDINGS: CT HEAD FINDINGS Brain: No evidence of acute infarction, hemorrhage, hydrocephalus, extra-axial collection or mass lesion/mass effect. Global cortical atrophy. Subcortical white matter and periventricular small vessel ischemic changes. Vascular: Intracranial atherosclerosis. Skull: Normal. Negative for fracture or focal lesion. Sinuses/Orbits: The visualized paranasal  sinuses are essentially clear. The mastoid air cells are unopacified. Other: None. CT CERVICAL SPINE FINDINGS Alignment: Mild straightening of the cervical spine, likely positional. Skull base and vertebrae: No acute fracture. No primary bone lesion or focal pathologic process. Soft tissues and spinal canal: No prevertebral fluid or swelling. No visible canal hematoma. Disc levels:  Mild multilevel degenerative changes. Spinal canal is patent. Upper chest: Visualized lung apices are clear. Other: Visualized thyroid is grossly unremarkable. IMPRESSION: No evidence of acute intracranial abnormality. Atrophy with small vessel ischemic changes. No evidence of traumatic injury to the cervical spine. Mild multilevel degenerative changes. Electronically Signed   By: Julian Hy M.D.   On: 12/18/2016 19:06   Ct Cervical Spine Wo Contrast  Result Date: 12/18/2016 CLINICAL DATA:  Fall EXAM: CT HEAD WITHOUT CONTRAST CT CERVICAL SPINE WITHOUT CONTRAST TECHNIQUE: Multidetector CT imaging of the head and cervical spine was performed following the standard protocol without intravenous contrast. Multiplanar CT image reconstructions of the cervical spine were also generated. COMPARISON:  CT head dated 09/11/2014 FINDINGS: CT HEAD FINDINGS Brain: No evidence of acute infarction, hemorrhage, hydrocephalus, extra-axial collection or mass lesion/mass effect. Global cortical atrophy. Subcortical white matter and periventricular small vessel ischemic changes. Vascular: Intracranial atherosclerosis. Skull: Normal. Negative for fracture or focal lesion. Sinuses/Orbits: The visualized paranasal sinuses are essentially clear. The mastoid air cells are unopacified. Other: None. CT CERVICAL SPINE FINDINGS Alignment: Mild straightening of the cervical spine, likely positional. Skull base and vertebrae: No acute fracture. No primary bone lesion or focal pathologic process. Soft tissues and spinal canal: No prevertebral fluid or  swelling. No visible canal hematoma. Disc levels:  Mild multilevel degenerative changes. Spinal canal is patent. Upper chest: Visualized lung apices are clear. Other: Visualized thyroid is grossly unremarkable. IMPRESSION: No evidence of acute intracranial abnormality. Atrophy with small vessel ischemic changes. No evidence of traumatic injury to the cervical spine. Mild multilevel degenerative changes. Electronically Signed   By: Julian Hy M.D.   On: 12/18/2016 19:06   Ct Ankle Left Wo Contrast  Result Date: 12/18/2016 CLINICAL DATA:  83 year old female with fracture of the left tibia and fibula. Operative planning. EXAM: CT OF THE LEFT ANKLE WITHOUT CONTRAST TECHNIQUE: Multidetector CT imaging of the left ankle was performed according to the standard protocol. Multiplanar CT image reconstructions were also generated. COMPARISON:  Radiograph dated 12/18/2009 FINDINGS: Bones/Joint/Cartilage There is an angulated and displaced comminuted fracture of the distal fibula. Approximately 50% shaft width anterior displacement of the distal fracture  fragment. There is a comminuted oblique fracture of the distal tibial diaphysis with approximately 50% shaft width posterior and 6 mm lateral displacement of the distal fracture fragment. No intra-articular extension noted. There is advanced osteopenia. No dislocation. A the ankle mortise is intact. Ligaments Suboptimally assessed by CT. Muscles and Tendons No large intramuscular hematoma. Soft tissues Diffuse subcutaneous soft tissue edema and thickening. No fluid collection. IMPRESSION: 1. Comminuted and mildly displaced fractures of the distal tibia and fibular diaphysis. No dislocation. 2. Severe osteopenia. Electronically Signed   By: Anner Crete M.D.   On: 12/18/2016 21:20   Dg Tibia/fibula Left Port  Result Date: 12/19/2016 CLINICAL DATA:  Status post intramedullary nail placement. EXAM: PORTABLE LEFT TIBIA AND FIBULA - 2 VIEW COMPARISON:  Intra op  films from earlier the same day. FINDINGS: Two-view portable study shows the patient to be status post antegrade IM nail placement in the tibia with 3 proximal and 2 distal interlocking screws. Nail crosses oblique fracture the distal tibial metadiaphysis. Fractures of the proximal and distal fibula are evident. Distal portion of a femoral IM nail is visualized .  IMPRESSION: Status post antegrade tibial IM nail placement without evidence for hardware complications. Electronically Signed   By: Misty Stanley M.D.   On: 12/19/2016 20:10   Dg C-arm 1-60 Min  Result Date: 12/19/2016 CLINICAL DATA:  Intramedullary rod placement. FLUOROSCOPY TIME:  3 minutes. Images: 5 EXAM: LEFT TIBIA AND FIBULA - 2 VIEW; DG C-ARM 61-120 MIN COMPARISON:  None. FINDINGS: An intramedullary rod has been placed across the distal tibial diaphysis fracture with interlocking screws seen superiorly and inferiorly. A comminuted fibular fracture remains. IMPRESSION: Tibial fracture repair as above. Electronically Signed   By: Dorise Bullion III M.D   On: 12/19/2016 17:18    Scheduled Meds: . ALPRAZolam  0.5 mg Oral QHS  . aspirin  81 mg Oral Daily  . calcium carbonate  1 tablet Oral Q breakfast  . cholecalciferol  1,000 Units Oral Daily  . darifenacin  7.5 mg Oral Daily  . donepezil  10 mg Oral QHS  . enoxaparin (LOVENOX) injection  30 mg Subcutaneous Q24H  . escitalopram  10 mg Oral Daily  . feeding supplement (ENSURE ENLIVE)  237 mL Oral BID BM  . HYDROcodone-acetaminophen  1 tablet Oral QHS  . levothyroxine  25 mcg Oral QAC breakfast  . losartan  50 mg Oral Daily  . Melatonin  1 tablet Oral QHS  . memantine  10 mg Oral BID  . mirtazapine  15 mg Oral Daily  . multivitamin with minerals  1 tablet Oral Daily  . pantoprazole  40 mg Oral Daily  . senna  1 tablet Oral BID      Time spent: 25 min  [facility] Pager [phone]. If 7PM-7AM, please contact night-coverage at [email or web address], Office   [phone]  password [email or web address]  12/20/2016, 1:37 PM  LOS: 2 days

## 2016-12-20 NOTE — Clinical Social Work Note (Signed)
Clinical Social Work Assessment  Patient Details  Name: Alexandra Booth MRN: [id number] Date of Birth: Sep 28, 1923  Date of referral:  12/20/16               Reason for consult:  Facility Placement                Permission sought to share information with:  Chartered certified accountant granted to share information::  Yes, Verbal Permission Granted  Name::        Agency::  [facility]  Relationship::     Contact Information:     Housing/Transportation Living arrangements for the past 2 months:  [place] of Information:  Case Manager Patient Interpreter Needed:  None Criminal Activity/Legal Involvement Pertinent to Current Situation/Hospitalization:  No - Comment as needed Significant Relationships:  Other Family Members, Community Support Lives with:  Adult Children Do you feel safe going back to the place where you live?  No Need for family participation in patient care:  Yes (Comment)  Care giving concerns:  Pt lives at home with disabled dtr who is unable to offer physical assist- currently with high level of impairment.   Social Worker assessment / plan:  CSW spoke with pt Education officer, museum at [employer] who states pt will need SNF at time of DC and that they will approve SNF stay.  Employment status:  Retired Forensic scientist:  Other (Comment Required) ([facility]) PT Recommendations:  [place] / Referral to community resources:  [place]  Patient/Family's Response to care:  Pt and family agreeable to SNF placement and hopeful for [employer].  Patient/Family's Understanding of and Emotional Response to Diagnosis, Current Treatment, and Prognosis:  No questions or concerns- hopeful short term rehab will allow pt to return safely home.  Emotional Assessment Appearance:  Appears stated age Attitude/Demeanor/Rapport:    Affect (typically observed):  Accepting, Appropriate Orientation:  Oriented to Self,  Oriented to Place, Oriented to  Time, Oriented to Situation Alcohol / Substance use:  Not Applicable Psych involvement (Current and /or in the community):  No (Comment)  Discharge Needs  Concerns to be addressed:  Care Coordination Readmission within the last 30 days:  No Current discharge risk:  Physical Impairment Barriers to Discharge:  Continued Medical Work up   Jorge Ny, LCSW 12/20/2016, 2:12 PM

## 2016-12-20 NOTE — NC FL2 (Signed)
[place] LEVEL OF CARE SCREENING TOOL     IDENTIFICATION  Patient Name: Alexandra Booth Birthdate: 21-Dec-1923 Sex: female Admission Date (Current Location): 12/18/2016  [facility] and [place] Number:  Herbalist and Address:  [facility], [place] [address], [place], [place] [address]      Provider Number: [id number]  Attending Physician Name and Address:  Oswald Hillock, MD  Relative Name and Phone Number:       Current Level of Care: Hospital Recommended Level of Care: [place] Prior Approval Number:    Date Approved/Denied:   PASRR Number: [id number]  Discharge Plan: SNF    Current Diagnoses: Patient Active Problem List   Diagnosis Date Noted  . Fracture of tibial shaft, left, closed 12/18/2016  . Fracture of left proximal fibula 12/18/2016  . GERD (gastroesophageal reflux disease)   . Depression with anxiety   . Fever 09/14/2014  . Compression fracture of body of thoracic vertebra ([place]) 09/14/2014  . Osteoporosis 09/14/2014  . Irregular heart beats 09/14/2014  . Abnormal chest x-ray   . Acute blood loss anemia   . Macrocytic anemia   . Obesity 09/12/2014  . Fall 09/11/2014  . UTI (lower urinary tract infection) 09/11/2014  . Chronic kidney disease (CKD), stage III (moderate) 09/11/2014  . Fracture, proximal femur ([place]) 09/11/2014  . Femur fracture, left ([place]) 09/11/2014  . Opacity of lung on imaging study 09/11/2014  . URINARY RETENTION 10/17/2008  . HYPERTENSION 09/07/2008  . HYPOKALEMIA 07/21/2008  . DEMENTIA 07/21/2008  . KNEE PAIN, BILATERAL 05/17/2008  . ACCIDENTAL FALL, HX OF 05/17/2008  . ABDOMINAL PAIN OTHER SPECIFIED SITE 02/05/2008  . DYSPHAGIA UNSPECIFIED 01/13/2008  . Anemia 09/21/2007  . THYROIDECTOMY, HX OF 09/03/2007  . LAMINECTOMY, LUMBAR, HX OF 09/03/2007  . INTERTRIGO, CANDIDAL 06/24/2007  . CARCINOID TUMOR 05/01/2007  . CHEST PAIN 05/01/2007  . HEMORRHOIDS, EXTERNAL  12/23/2006  . 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 Hypothyroidism 04/28/2006  . T2DM (type 2 diabetes mellitus) ([place]) 04/28/2006  . HYPERLIPIDEMIA 04/28/2006  . DEPRESSION 04/28/2006  . Coronary atherosclerosis 04/28/2006  . [place] DISEASE, LUMBAR SPINE 04/28/2006  . LUMBAR RADICULOPATHY 04/28/2006    Orientation RESPIRATION BLADDER Height & Weight     Self, Time, Situation, Place  Normal Continent Weight: 210 lb (95.3 kg) Height:  5\' 9"  (175.3 cm)  BEHAVIORAL SYMPTOMS/MOOD NEUROLOGICAL BOWEL NUTRITION STATUS      Continent Diet (see DC summary)  AMBULATORY STATUS COMMUNICATION OF NEEDS Skin   Extensive Assist Verbally Surgical wounds                       Personal Care Assistance Level of Assistance  Bathing, Dressing Bathing Assistance: Maximum assistance   Dressing Assistance: Maximum assistance     Functional Limitations Info             SPECIAL CARE FACTORS FREQUENCY  PT (By licensed PT), OT (By licensed OT)     PT Frequency: 5/wk OT Frequency: 5/wk            Contractures      Additional Factors Info  Code Status, Allergies, Psychotropic Code Status Info: DNR Allergies Info: [id number] A Psychotropic Info: xanax         Current Medications (12/20/2016):  This is the current hospital active medication list Current Facility-Administered Medications  Medication Dose Route Frequency Provider Last Rate Last Dose  . 0.9 %  sodium chloride  infusion   Intravenous Continuous Ivor Costa, MD 75 mL/hr at 12/19/16 1140    . 0.9 %  sodium chloride infusion   Intravenous Continuous Leandrew Koyanagi, MD 125 mL/hr at 12/19/16 1817    . acetaminophen (TYLENOL) tablet 650 mg  650 mg Oral Q6H PRN Leandrew Koyanagi, MD       Or  . acetaminophen (TYLENOL) suppository 650 mg  650 mg Rectal Q6H PRN Leandrew Koyanagi, MD      . ALPRAZolam Duanne Moron) tablet 0.5 mg  0.5 mg Oral QHS Leandrew Koyanagi, MD   0.5 mg at 12/19/16 2225  . alum & mag  hydroxide-simeth (MAALOX/MYLANTA) 200-200-20 MG/5ML suspension 30 mL  30 mL Oral Q4H PRN Leandrew Koyanagi, MD      . aspirin chewable tablet 81 mg  81 mg Oral Daily Ivor Costa, MD   81 mg at 12/20/16 0857  . calcium carbonate (OS-CAL - dosed in mg of elemental calcium) tablet 500 mg of elemental calcium  1 tablet Oral Q breakfast Ivor Costa, MD   500 mg of elemental calcium at 12/20/16 0854  . cholecalciferol (VITAMIN D) tablet 1,000 Units  1,000 Units Oral Daily Ivor Costa, MD   1,000 Units at 12/20/16 0855  . darifenacin (ENABLEX) 24 hr tablet 7.5 mg  7.5 mg Oral Daily Ivor Costa, MD   7.5 mg at 12/20/16 0854  . donepezil (ARICEPT) tablet 10 mg  10 mg Oral QHS Leandrew Koyanagi, MD   10 mg at 12/19/16 2225  . enoxaparin (LOVENOX) injection 30 mg  30 mg Subcutaneous Q24H Leandrew Koyanagi, MD   30 mg at 12/20/16 0856  . escitalopram (LEXAPRO) tablet 10 mg  10 mg Oral Daily Leandrew Koyanagi, MD   10 mg at 12/20/16 0854  . feeding supplement (ENSURE ENLIVE) (ENSURE ENLIVE) liquid 237 mL  237 mL Oral BID BM Oswald Hillock, MD   237 mL at 12/20/16 0856  . hydrALAZINE (APRESOLINE) injection 5 mg  5 mg Intravenous Q2H PRN Ivor Costa, MD      . HYDROcodone-acetaminophen (NORCO/VICODIN) 5-325 MG per tablet 1 tablet  1 tablet Oral QHS Leandrew Koyanagi, MD   1 tablet at 12/19/16 2224  . HYDROcodone-acetaminophen (NORCO/VICODIN) 5-325 MG per tablet 1-2 tablet  1-2 tablet Oral Q6H PRN Leandrew Koyanagi, MD      . hydroxypropyl methylcellulose / hypromellose (ISOPTO TEARS / GONIOVISC) 2.5 % ophthalmic solution 1 drop  1 drop Both Eyes PRN Oswald Hillock, MD      . lactated ringers infusion   Intravenous Continuous Oleta Mouse, MD 10 mL/hr at 12/19/16 1435    . levothyroxine (SYNTHROID, LEVOTHROID) tablet 25 mcg  25 mcg Oral QAC breakfast Ivor Costa, MD   25 mcg at 12/20/16 0854  . losartan (COZAAR) tablet 50 mg  50 mg Oral Daily Leandrew Koyanagi, MD   50 mg at 12/20/16 0856  . Melatonin TABS 3 mg  1 tablet Oral QHS Leandrew Koyanagi,  MD   3 mg at 12/19/16 2225  . memantine (NAMENDA) tablet 10 mg  10 mg Oral BID Leandrew Koyanagi, MD   10 mg at 12/20/16 0855  . menthol-cetylpyridinium (CEPACOL) lozenge 3 mg  1 lozenge Oral PRN Leandrew Koyanagi, MD       Or  . phenol (CHLORASEPTIC) mouth spray 1 spray  1 spray Mouth/Throat PRN Leandrew Koyanagi, MD      . methocarbamol (ROBAXIN) tablet 500 mg  500 mg Oral Q6H PRN Leandrew Koyanagi, MD   500 mg at 12/20/16 0272   Or  . methocarbamol (ROBAXIN) 500 mg in dextrose 5 % 50 mL IVPB  500 mg Intravenous Q6H PRN Leandrew Koyanagi, MD      . metoCLOPramide (REGLAN) tablet 5-10 mg  5-10 mg Oral Q8H PRN Leandrew Koyanagi, MD       Or  . metoCLOPramide (REGLAN) injection 5-10 mg  5-10 mg Intravenous Q8H PRN Leandrew Koyanagi, MD      . mirtazapine (REMERON) tablet 15 mg  15 mg Oral Daily Ivor Costa, MD   15 mg at 12/20/16 0855  . morphine 4 MG/ML injection 0.52 mg  0.52 mg Intravenous Q2H PRN Oswald Hillock, MD      . multivitamin with minerals tablet 1 tablet  1 tablet Oral Daily Ivor Costa, MD   1 tablet at 12/20/16 0854  . ondansetron (ZOFRAN) injection 4 mg  4 mg Intravenous Q8H PRN Ivor Costa, MD   4 mg at 12/19/16 1616  . ondansetron (ZOFRAN) tablet 4 mg  4 mg Oral Q6H PRN Leandrew Koyanagi, MD       Or  . ondansetron [facility]) injection 4 mg  4 mg Intravenous Q6H PRN Leandrew Koyanagi, MD      . oxyCODONE (Oxy IR/ROXICODONE) immediate release tablet 5-10 mg  5-10 mg Oral Q4H PRN Leandrew Koyanagi, MD   10 mg at 12/20/16 0306  . pantoprazole (PROTONIX) EC tablet 40 mg  40 mg Oral Daily Ivor Costa, MD   40 mg at 12/20/16 0855  . phenazopyridine (PYRIDIUM) tablet 200 mg  200 mg Oral Daily PRN Leandrew Koyanagi, MD   200 mg at 12/19/16 2226  . polyethylene glycol (MIRALAX / GLYCOLAX) packet 17 g  17 g Oral Daily PRN Ivor Costa, MD      . senna (SENOKOT) tablet 8.6 mg  1 tablet Oral BID Oswald Hillock, MD   8.6 mg at 12/20/16 0855  . zolpidem (AMBIEN) tablet 5 mg  5 mg Oral QHS PRN Ivor Costa, MD         Discharge  Medications: Please see discharge summary for a list of discharge medications.  Relevant Imaging Results:  Relevant Lab Results:   Additional Information SS#: [id number]  Jorge Ny, LCSW

## 2016-12-20 NOTE — Evaluation (Signed)
Physical Therapy Evaluation Patient Details Name: Alexandra Booth MRN: [id number] DOB: July 23, 1923 Today's Date: 12/20/2016   History of Present Illness  83 yo female with fall at home with caregiver, now has L proximal fib-tibial shaft fracture with IM nailing tib, manipulation to fib. [facility] program participant.  PMHx:  DM, CAD, HTN, macrocytic anemia, hypothyroidism, CKD 3  Clinical Impression  Pt is up to bedside with assistance, weak and having trouble with tolerating much movement to LLE.  Her plan is to work on strength of both legs, and will work on Veterinary surgeon transfers as tolerated, but will need to see how her alertness progresses with her hospital stay.  Expecting transition to SNF based on her dependence of help for all movement along with her NWB on LLE in cam boot.  Follow acutely and may need two person assist indefinitely.    Follow Up Recommendations SNF    Equipment Recommendations  None recommended by PT    Recommendations for Other Services       Precautions / Restrictions Precautions Precautions: Fall Restrictions Weight Bearing Restrictions: Yes LLE Weight Bearing: Non weight bearing Other Position/Activity Restrictions: Cam boot L ankle at all times      Mobility  Bed Mobility Overal bed mobility: Needs Assistance Bed Mobility: Supine to Sit;Sit to Supine     Supine to sit: Mod assist Sit to supine: Max assist   General bed mobility comments: Pt requires hand over hand cues to set up the transition to sit but does not hold the bed rail to assist  Transfers Overall transfer level: Needs assistance               General transfer comment: too lethargic and poorly holding onto her sitting control to attempt standing  Ambulation/Gait             General Gait Details: unable to attempt, pt reports being non ambulatory for a long time  Stairs            Wheelchair Mobility    Modified Rankin (Stroke Patients Only)        Balance Overall balance assessment: Needs assistance;History of Falls Sitting-balance support: Single extremity supported;Bilateral upper extremity supported Sitting balance-Leahy Scale: Poor                                       Pertinent Vitals/Pain Pain Assessment: Faces Faces Pain Scale: Hurts whole lot Pain Location: L ankle with movement Pain Descriptors / Indicators: Operative site guarding;Aching Pain Intervention(s): Limited activity within patient's tolerance;Monitored during session;Premedicated before session;Repositioned;Ice applied    Home Living Family/patient expects to be discharged to:: Unsure Living Arrangements: Children               Additional Comments: daughter is home with pt    Prior Function Level of Independence: Needs assistance   Gait / Transfers Assistance Needed: wc mobility recently per pt but could stand with assistance to transfer to chair  ADL's / Homemaking Assistance Needed: daughter cares for the home        Hand Dominance        Extremity/Trunk Assessment   Upper Extremity Assessment Upper Extremity Assessment: Generalized weakness    Lower Extremity Assessment Lower Extremity Assessment: Generalized weakness;LLE deficits/detail LLE Deficits / Details: cam boot with NWB from fracture and surgery, able to do ROM to knee LLE Coordination: decreased fine motor;decreased gross motor  Cervical / Trunk Assessment Cervical / Trunk Assessment: Kyphotic  Communication   Communication: No difficulties  Cognition Arousal/Alertness: Lethargic;Suspect due to medications Behavior During Therapy: Flat affect Overall Cognitive Status: No family/caregiver present to determine baseline cognitive functioning                                 General Comments: pt answers questions about PLOF but did not know how long she has been non ambulatory      General Comments      Exercises      Assessment/Plan    PT Assessment Patient needs continued PT services  PT Problem List Decreased strength;Decreased range of motion;Decreased activity tolerance;Decreased balance;Decreased mobility;Decreased coordination;Decreased cognition;Decreased safety awareness;Cardiopulmonary status limiting activity;Obesity;Decreased skin integrity;Pain       PT Treatment Interventions DME instruction;Functional mobility training;Therapeutic activities;Balance training;Therapeutic exercise;Neuromuscular re-education;Patient/family education    PT Goals (Current goals can be found in the Care Plan section)  Acute Rehab PT Goals Patient Stated Goal: to feel better, try to move more PT Goal Formulation: With patient Time For Goal Achievement: 01/03/17 Potential to Achieve Goals: Fair    Frequency Min 2X/week   Barriers to discharge Other (comment) (not clear on how much time daughter is home with pt)      Co-evaluation               AM-PAC PT "6 Clicks" Daily Activity  Outcome Measure Difficulty turning over in bed (including adjusting bedclothes, sheets and blankets)?: Total Difficulty moving from lying on back to sitting on the side of the bed? : Total Difficulty sitting down on and standing up from a chair with arms (e.g., wheelchair, bedside commode, etc,.)?: Total Help needed moving to and from a bed to chair (including a wheelchair)?: Total Help needed walking in hospital room?: Total Help needed climbing 3-5 steps with a railing? : Total 6 Click Score: 6    End of Session Equipment Utilized During Treatment: Oxygen (at rest sats were 89% and sitting was 97% on high flow cannu) Activity Tolerance: Patient limited by fatigue;Patient limited by lethargy Patient left: in bed;with call bell/phone within reach;with bed alarm set Nurse Communication: Mobility status PT Visit Diagnosis: Muscle weakness (generalized) (M62.81);History of falling (Z91.81);Difficulty in walking, not  elsewhere classified (R26.2)    Time: 1020-1056 PT Time Calculation (min) (ACUTE ONLY): 36 min   Charges:   PT Evaluation $PT Eval Moderate Complexity: 1 Procedure PT Treatments $Therapeutic Exercise: 8-22 mins   PT G Codes:   PT G-Codes **NOT FOR INPATIENT CLASS** Functional Assessment Tool Used: AM-PAC 6 Clicks Basic Mobility    Ramond Dial 12/20/2016, 1:10 PM   Mee Hives, PT MS Acute Rehab Dept. Number: [place] [facility] [place]

## 2016-12-21 LAB — CBC
HCT: 25.7 % — ABNORMAL LOW (ref 36.0–46.0)
HEMOGLOBIN: 8 g/dL — AB (ref 12.0–15.0)
MCH: 33.1 pg (ref 26.0–34.0)
MCHC: 31.1 g/dL (ref 30.0–36.0)
MCV: 106.2 fL — ABNORMAL HIGH (ref 78.0–100.0)
PLATELETS: 165 10*3/uL (ref 150–400)
RBC: 2.42 MIL/uL — AB (ref 3.87–5.11)
RDW: 12.5 % (ref 11.5–15.5)
WBC: 10.7 10*3/uL — ABNORMAL HIGH (ref 4.0–10.5)

## 2016-12-21 LAB — BASIC METABOLIC PANEL
Anion gap: 5 (ref 5–15)
BUN: 23 mg/dL — ABNORMAL HIGH (ref 6–20)
CHLORIDE: 111 mmol/L (ref 101–111)
CO2: 24 mmol/L (ref 22–32)
CREATININE: 1.88 mg/dL — AB (ref 0.44–1.00)
Calcium: 8.2 mg/dL — ABNORMAL LOW (ref 8.9–10.3)
GFR calc Af Amer: 26 mL/min — ABNORMAL LOW (ref >60)
GFR, EST NON AFRICAN AMERICAN: 22 mL/min — AB (ref >60)
Glucose, Bld: 112 mg/dL — ABNORMAL HIGH (ref 65–99)
Potassium: 4.6 mmol/L (ref 3.5–5.1)
SODIUM: 140 mmol/L (ref 135–145)

## 2016-12-21 MED ORDER — ALPRAZOLAM 0.5 MG PO TABS: 0.5000 mg | ORAL_TABLET | Freq: Every day | ORAL | 0 refills | Status: DC

## 2016-12-21 MED ORDER — ASPIRIN 81 MG PO CHEW: 81.0000 mg | CHEWABLE_TABLET | Freq: Every day | ORAL | Status: DC

## 2016-12-21 MED ORDER — HYDROCODONE-ACETAMINOPHEN 5-325 MG PO TABS: 1.0000 | ORAL_TABLET | Freq: Four times a day (QID) | ORAL | Status: DC | PRN

## 2016-12-21 MED ORDER — POLYETHYLENE GLYCOL 3350 17 G PO PACK: 17.0000 g | PACK | Freq: Every day | ORAL | 0 refills | Status: DC | PRN

## 2016-12-21 NOTE — Care Management Note (Signed)
Case Management Note  Patient Details  Name: Alexandra Booth MRN: [id number] Date of Birth: 1923-09-26  Subjective/Objective:                 Will DC to SNF as facilitated by CSW.    Action/Plan:   Expected Discharge Date:  12/21/16               Expected Discharge Plan:  Skilled Nursing Facility  In-House Referral:  Clinical Social Work  Discharge planning Services  CM Consult  Post Acute Care Choice:    Choice offered to:     DME Arranged:    DME Agency:     HH Arranged:    [place] Agency:     Status of Service:  Completed, signed off  If discussed at [employer], dates discussed:    Additional Comments:  Carles Collet, RN 12/21/2016, 3:08 PM

## 2016-12-21 NOTE — Discharge Summary (Signed)
Physician Discharge Summary  Alexandra Booth ONG:[id number] DOB: 13-Aug-1923 DOA: 12/18/2016  PCP: Janifer Adie, MD  Admit date: 12/18/2016 Discharge date: 12/21/2016  Time spent: 35  minutes  Recommendations for Outpatient Follow-up:  1. Patient to be discharged to skilled facility 2. Continue Lovenox 30 mg subcutaneous daily for 28 days for DVT prophylaxis 3. Will discontinue Remeron due to hypersomnolence, patient had stopped taking Remeron 30 days prior to admission 4. Check CBC in 3 days for anemia   Discharge Diagnoses:  Principal Problem:   Fracture of tibial shaft, left, closed Active Problems:   Hypothyroidism   T2DM (type 2 diabetes mellitus) ([place])   HYPERTENSION   Coronary atherosclerosis   Fall   Chronic kidney disease (CKD), stage III (moderate)   Macrocytic anemia   GERD (gastroesophageal reflux disease)   Depression with anxiety   Fracture of left proximal fibula   Discharge Condition: Stable  Diet recommendation: Heart healthy diet, carb modified diet  Filed Weights   12/18/16 1648 12/19/16 1434  Weight: 95.3 kg (210 lb) 95.3 kg (210 lb)    History of present illness:  83 y.o.femalewith medical history significant of hypertension, hyperlipidemia, diet-controlled diabetes, GERD, hypothyroidism, depression, anxiety, carcinoid tumor, CKD-3, CAD, dementia, who presents with fall, and left ankle and left knee pain. Patient found to have closed left ankle fracture with fracture of left proximal tibia/fibula due to mechanical fall.  Hospital Course:  1. Closed left ankle fracture, and left proximal tibia-fibula fracture- orthopedic surgery was consulted,Patient underwent surgery. She is stable 2. Hypothyroidism-last TSH was 0.406 on 09/11/2014. Continue Synthroid. 3. Anemia-postoperative, likely  blood loss, hemoglobin is 8.0. Check CBC in 3 days 4. Hypertension- blood pressure stable.  5. History of CAD- no chest pain, continue aspirin. 6. GERD-continue  PPI 7. Depression and anxiety-stable, continue Lexapro, Xanax. We'll discontinue Remeron  8. Hypersomnolence-likely from Remeron started during this admission. As per pharmacy notes patient had stopped taking Remeron 30 days prior to admission. 9. Diabetes mellitus-last A1c was 5.6 on 09/11/2014. Patient is not taking medication at home. 10. Chronic kidney disease stage III- baseline creatinine is 1.7-2.0. Today creatinine is 1.88   Procedures:  Left tibia closed reduction and intramedullary nailing  Closed treatment of left fibular shaft fracture with manipulation  Consultations:  Orthopedics  Discharge Exam: Vitals:   12/20/16 2241 12/21/16 0500  BP: (!) 141/45 (!) 138/46  Pulse: 78 71  Resp: 18 17  Temp: 100.3 F (37.9 C) 99.9 F (37.7 C)    General: Somnolent but arousable Cardiovascular: S1-S2 regular Respiratory: Clear to auscultation bilaterally  Discharge Instructions   Discharge Instructions    Diet - low sodium heart healthy    Complete by:  As directed    Increase activity slowly    Complete by:  As directed    Non weight bearing    Complete by:  As directed      Current Discharge Medication List    START taking these medications   Details  enoxaparin (LOVENOX) 30 MG/0.3ML injection Inject 0.3 mLs (30 mg total) into the skin daily. Qty: 28 Syringe, Refills: 0      CONTINUE these medications which have CHANGED   Details  ALPRAZolam (XANAX) 0.5 MG tablet Take 1 tablet (0.5 mg total) by mouth at bedtime. Qty: 5 tablet, Refills: 0    aspirin 81 MG chewable tablet Chew 1 tablet (81 mg total) by mouth daily.    HYDROcodone-acetaminophen (NORCO) 5-325 MG tablet Take 1-2 tablets by mouth every  6 (six) hours as needed. Qty: 90 tablet, Refills: 0    polyethylene glycol (MIRALAX / GLYCOLAX) packet Take 17 g by mouth daily as needed for mild constipation. Qty: 14 each, Refills: 0      CONTINUE these medications which have NOT CHANGED   Details   calcium carbonate (OS-CAL - DOSED IN MG OF ELEMENTAL CALCIUM) 1250 (500 CA) MG tablet Take 1 tablet (500 mg of elemental calcium total) by mouth daily with breakfast.    Cholecalciferol 1000 UNITS capsule Take 1,000 Units by mouth daily.    donepezil (ARICEPT) 10 MG tablet Take 10 mg by mouth at bedtime.    escitalopram (LEXAPRO) 10 MG tablet Take 10 mg by mouth daily.    feeding supplement, ENSURE ENLIVE, (ENSURE ENLIVE) LIQD Take 237 mLs by mouth 2 (two) times daily between meals. Qty: 237 mL, Refills: 12    levothyroxine (SYNTHROID, LEVOTHROID) 25 MCG tablet Take 25 mcg by mouth daily before breakfast.    losartan (COZAAR) 50 MG tablet Take 50 mg by mouth daily.    Melatonin 3 MG TABS Take 1 tablet by mouth at bedtime.    memantine (NAMENDA) 10 MG tablet Take 10 mg by mouth 2 (two) times daily.    Menthol, Topical Analgesic, (BIOFREEZE EX) Apply 1 application topically 3 (three) times daily. 3% Apply to knees    Multiple Vitamins-Minerals (MULTIVITAMIN WITH MINERALS) tablet Take 1 tablet by mouth daily.    nystatin (NYSTATIN) powder Apply 1 Bottle topically 3 (three) times daily as needed (skin irritation).    omeprazole (PRILOSEC) 40 MG capsule Take 40 mg by mouth 2 (two) times daily.    phenazopyridine (PYRIDIUM) 200 MG tablet Take 200 mg by mouth daily as needed for pain (painful urination).    Polyethyl Glycol-Propyl Glycol (SYSTANE) 0.4-0.3 % SOLN Apply 1 drop to eye 2 (two) times daily as needed (dry eyes).    senna (SENOKOT) 8.6 MG tablet Take 1 tablet by mouth 2 (two) times daily.    solifenacin (VESICARE) 5 MG tablet Take 5 mg by mouth daily.    vitamin B-12 (CYANOCOBALAMIN) 500 MCG tablet Take 500 mcg by mouth daily.      STOP taking these medications     acetaminophen (TYLENOL) 325 MG tablet      mirtazapine (REMERON) 15 MG tablet        Allergies  Allergen Reactions  . Diazepam     REACTION: Unknown reaction  . Penicillins     REACTION: rash,  itching  . Sulfonamide Derivatives     REACTION: GI upset    Contact information for follow-up providers    Leandrew Koyanagi, MD In 2 weeks.   Specialty:  Orthopedic Surgery Why:  For suture removal, For wound re-check Contact information: [place]  [id number] [phone]            Contact information for after-discharge care    Destination    [facility] .   Specialty:  Skilled Nursing Facility Contact information: [address] [place] [phone]                   The results of significant diagnostics from this hospitalization (including imaging, microbiology, ancillary and laboratory) are listed below for reference.    Significant Diagnostic Studies: Dg Knee 2 Views Left  Result Date: 12/18/2016 CLINICAL DATA:  Ankle pain radiating to or at the knee EXAM: LEFT KNEE - 1-2 VIEW COMPARISON:  None. FINDINGS: In diffuse osteopenia. No evidence of fracture of the tibia. Oblique minimally displaced fracture of the proximal fibula with mild anterior displacement. Osteoarthritic changes of all 3 compartments of the knee joint. Partially visualized intramedullary rod within the left femur. IMPRESSION: Oblique minimally displaced fracture of the proximal fibula. Three compartment osteoarthritic changes of the knee. Osteopenia. Electronically Signed   By: Fidela Salisbury M.D.   On: 12/18/2016 17:46   Dg Tibia/fibula Left  Result Date: 12/19/2016 CLINICAL DATA:  Intramedullary rod placement. FLUOROSCOPY TIME:  3 minutes. Images: 5 EXAM: LEFT TIBIA AND FIBULA - 2 VIEW; DG C-ARM 61-120 MIN COMPARISON:  None. FINDINGS: An intramedullary rod has been placed across the distal tibial diaphysis fracture with interlocking screws seen superiorly and inferiorly. A comminuted fibular fracture remains. IMPRESSION: Tibial fracture repair as above. Electronically Signed   By: Dorise Bullion III M.D   On:  12/19/2016 17:18   Dg Ankle Complete Left  Result Date: 12/18/2016 CLINICAL DATA:  Fall. EXAM: LEFT ANKLE COMPLETE - 3+ VIEW COMPARISON:  None. FINDINGS: Set fractures are noted through the metadiaphysis of the distal left tibia and fibula. Mild angulation at the fibular fractures. No subluxation or dislocation. IMPRESSION: Fractures within the distal left radial and fibular metadiaphysis. Electronically Signed   By: Rolm Baptise M.D.   On: 12/18/2016 17:45   Ct Head Wo Contrast  Result Date: 12/18/2016 CLINICAL DATA:  Fall EXAM: CT HEAD WITHOUT CONTRAST CT CERVICAL SPINE WITHOUT CONTRAST TECHNIQUE: Multidetector CT imaging of the head and cervical spine was performed following the standard protocol without intravenous contrast. Multiplanar CT image reconstructions of the cervical spine were also generated. COMPARISON:  CT head dated 09/11/2014 FINDINGS: CT HEAD FINDINGS Brain: No evidence of acute infarction, hemorrhage, hydrocephalus, extra-axial collection or mass lesion/mass effect. Global cortical atrophy. Subcortical white matter and periventricular small vessel ischemic changes. Vascular: Intracranial atherosclerosis. Skull: Normal. Negative for fracture or focal lesion. Sinuses/Orbits: The visualized paranasal sinuses are essentially clear. The mastoid air cells are unopacified. Other: None. CT CERVICAL SPINE FINDINGS Alignment: Mild straightening of the cervical spine, likely positional. Skull base and vertebrae: No acute fracture. No primary bone lesion or focal pathologic process. Soft tissues and spinal canal: No prevertebral fluid or swelling. No visible canal hematoma. Disc levels:  Mild multilevel degenerative changes. Spinal canal is patent. Upper chest: Visualized lung apices are clear. Other: Visualized thyroid is grossly unremarkable. IMPRESSION: No evidence of acute intracranial abnormality. Atrophy with small vessel ischemic changes. No evidence of traumatic injury to the cervical spine.  Mild multilevel degenerative changes. Electronically Signed   By: Julian Hy M.D.   On: 12/18/2016 19:06   Ct Cervical Spine Wo Contrast  Result Date: 12/18/2016 CLINICAL DATA:  Fall EXAM: CT HEAD WITHOUT CONTRAST CT CERVICAL SPINE WITHOUT CONTRAST TECHNIQUE: Multidetector CT imaging of the head and cervical spine was performed following the standard protocol without intravenous contrast. Multiplanar CT image reconstructions of the cervical spine were also generated. COMPARISON:  CT head dated 09/11/2014 FINDINGS: CT HEAD FINDINGS Brain: No evidence of acute infarction, hemorrhage, hydrocephalus, extra-axial collection or mass lesion/mass effect. Global cortical atrophy. Subcortical white matter and periventricular small vessel ischemic changes. Vascular: Intracranial atherosclerosis. Skull: Normal. Negative for fracture or focal lesion. Sinuses/Orbits: The visualized paranasal sinuses are essentially clear. The mastoid air cells are unopacified. Other: None. CT CERVICAL SPINE FINDINGS Alignment: Mild straightening of the cervical spine, likely positional. Skull base and vertebrae: No acute fracture. No primary bone lesion or focal pathologic process.  Soft tissues and spinal canal: No prevertebral fluid or swelling. No visible canal hematoma. Disc levels:  Mild multilevel degenerative changes. Spinal canal is patent. Upper chest: Visualized lung apices are clear. Other: Visualized thyroid is grossly unremarkable. IMPRESSION: No evidence of acute intracranial abnormality. Atrophy with small vessel ischemic changes. No evidence of traumatic injury to the cervical spine. Mild multilevel degenerative changes. Electronically Signed   By: Julian Hy M.D.   On: 12/18/2016 19:06   Ct Ankle Left Wo Contrast  Result Date: 12/18/2016 CLINICAL DATA:  83 year old female with fracture of the left tibia and fibula. Operative planning. EXAM: CT OF THE LEFT ANKLE WITHOUT CONTRAST TECHNIQUE: Multidetector CT  imaging of the left ankle was performed according to the standard protocol. Multiplanar CT image reconstructions were also generated. COMPARISON:  Radiograph dated 12/18/2009 FINDINGS: Bones/Joint/Cartilage There is an angulated and displaced comminuted fracture of the distal fibula. Approximately 50% shaft width anterior displacement of the distal fracture fragment. There is a comminuted oblique fracture of the distal tibial diaphysis with approximately 50% shaft width posterior and 6 mm lateral displacement of the distal fracture fragment. No intra-articular extension noted. There is advanced osteopenia. No dislocation. A the ankle mortise is intact. Ligaments Suboptimally assessed by CT. Muscles and Tendons No large intramuscular hematoma. Soft tissues Diffuse subcutaneous soft tissue edema and thickening. No fluid collection. IMPRESSION: 1. Comminuted and mildly displaced fractures of the distal tibia and fibular diaphysis. No dislocation. 2. Severe osteopenia. Electronically Signed   By: Anner Crete M.D.   On: 12/18/2016 21:20   Dg Tibia/fibula Left Port  Result Date: 12/19/2016 CLINICAL DATA:  Status post intramedullary nail placement. EXAM: PORTABLE LEFT TIBIA AND FIBULA - 2 VIEW COMPARISON:  Intra op films from earlier the same day. FINDINGS: Two-view portable study shows the patient to be status post antegrade IM nail placement in the tibia with 3 proximal and 2 distal interlocking screws. Nail crosses oblique fracture the distal tibial metadiaphysis. Fractures of the proximal and distal fibula are evident. Distal portion of a femoral IM nail is visualized .  IMPRESSION: Status post antegrade tibial IM nail placement without evidence for hardware complications. Electronically Signed   By: Misty Stanley M.D.   On: 12/19/2016 20:10   Dg C-arm 1-60 Min  Result Date: 12/19/2016 CLINICAL DATA:  Intramedullary rod placement. FLUOROSCOPY TIME:  3 minutes. Images: 5 EXAM: LEFT TIBIA AND FIBULA - 2  VIEW; DG C-ARM 61-120 MIN COMPARISON:  None. FINDINGS: An intramedullary rod has been placed across the distal tibial diaphysis fracture with interlocking screws seen superiorly and inferiorly. A comminuted fibular fracture remains. IMPRESSION: Tibial fracture repair as above. Electronically Signed   By: Dorise Bullion III M.D   On: 12/19/2016 17:18    Microbiology: Recent Results (from the past 240 hour(s))  Surgical pcr screen     Status: None   Collection Time: 12/19/16  6:14 AM  Result Value Ref Range Status   MRSA, PCR NEGATIVE NEGATIVE Final   Staphylococcus aureus NEGATIVE NEGATIVE Final    Comment:        The Xpert SA Assay (FDA approved for NASAL specimens in patients over 51 years of age), is one component of a comprehensive surveillance program.  Test performance has been validated by [facility] for patients greater than or equal to 48 year old. It is not intended to diagnose infection nor to guide or monitor treatment.      Labs: Basic Metabolic Panel:  Recent Labs Lab 12/18/16 1956 12/19/16  1918 12/20/16 0253 12/21/16 0450  NA 140  --  137 140  K 4.4  --  4.7 4.6  CL 107  --  108 111  CO2 26  --  23 24  GLUCOSE 103*  --  122* 112*  BUN 21*  --  18 23*  CREATININE 1.47* 1.44* 1.53* 1.88*  CALCIUM 8.8*  --  8.0* 8.2*   Liver Function Tests:  Recent Labs Lab 12/18/16 1956  AST 17  ALT 10*  ALKPHOS 79  BILITOT 0.7  PROT 6.3*  ALBUMIN 3.1*   No results for input(s): LIPASE, AMYLASE in the last 168 hours. No results for input(s): AMMONIA in the last 168 hours. CBC:  Recent Labs Lab 12/18/16 1956 12/19/16 1918 12/20/16 0253 12/21/16 0450  WBC 6.8 8.7 7.2 10.7*  NEUTROABS 4.2  --   --   --   HGB 10.1* 9.6* 8.5* 8.0*  HCT 30.1* 30.8* 26.9* 25.7*  MCV 99.0 104.4* 104.3* 106.2*  PLT 185 200 179 165       Signed:  Matti Minney S MD.  [facility] 12/21/2016, 12:22 PM

## 2016-12-21 NOTE — Clinical Social Work Placement (Addendum)
   CLINICAL SOCIAL WORK PLACEMENT  NOTE  Date:  12/21/2016  Patient Details  Name: Alexandra Booth MRN: [id number] Date of Birth: 05-10-1924  Clinical Social Work is seeking post-discharge placement for this patient at the [place] level of care (*CSW will initial, date and re-position this form in  chart as items are completed):  Yes   Patient/family provided with [facility] Work Department's list of facilities offering this level of care within the geographic area requested by the patient (or if unable, by the patient's family).  Yes   Patient/family informed of their freedom to choose among providers that offer the needed level of care, that participate in Medicare, Medicaid or managed care program needed by the patient, have an available bed and are willing to accept the patient.  Yes   Patient/family informed of [facility]'s ownership interest in [facility] and [facility], as well as of the fact that they are under no obligation to receive care at these facilities.  PASRR submitted to EDS on       PASRR number received on       Existing PASRR number confirmed on 12/20/16     FL2 transmitted to all facilities in geographic area requested by pt/family on 12/20/16     FL2 transmitted to all facilities within larger geographic area on       Patient informed that his/her managed care company has contracts with or will negotiate with certain facilities, including the following:        Yes   Patient/family informed of bed offers received.  Patient chooses bed at [facility]     Physician recommends and patient chooses bed at      Patient to be transferred to [facility] on 12/21/16.  Patient to be transferred to facility by [facility]     Patient family notified on 12/21/16 of transfer.  Name of family member notified: Left message for grand daughter , Elisha [place]; [place] called nurse from [employer] and made  aware of transport.    PHYSICIAN Please sign FL2, Please sign DNR, Please prepare priority discharge summary, including medications, Please prepare prescriptions     Additional Comment:    _______________________________________________ Normajean Baxter, LCSW 12/21/2016, 1:21 PM

## 2016-12-21 NOTE — Social Work (Signed)
Clinical Social Worker facilitated patient discharge including contacting patient family and facility to confirm patient discharge plans.  Clinical information faxed to facility and family agreeable with plan.    CSW arranged ambulance transport via [facility] to [facility].    RN to call [phone] to give report prior to discharge.  Clinical Social Worker will sign off for now as social work intervention is no longer needed. Please consult [place] again if new need arises.  Elissa Hefty, LCSW Clinical Social Worker [phone]

## 2016-12-23 ENCOUNTER — Encounter (HOSPITAL_COMMUNITY): Payer: Self-pay | Admitting: Orthopaedic Surgery

## 2016-12-23 LAB — TYPE AND SCREEN
ABO/RH(D): O POS
ANTIBODY SCREEN: POSITIVE
DAT, IGG: NEGATIVE
DAT, complement: NEGATIVE
DONOR AG TYPE: NEGATIVE
DONOR AG TYPE: NEGATIVE
UNIT DIVISION: 0
Unit division: 0

## 2016-12-23 LAB — BPAM RBC
BLOOD PRODUCT EXPIRATION DATE: 201807252359
Blood Product Expiration Date: 201807242359
ISSUE DATE / TIME: 201807101632
ISSUE DATE / TIME: 201807101632
UNIT TYPE AND RH: 9500
Unit Type and Rh: 9500

## 2017-01-06 ENCOUNTER — Ambulatory Visit (INDEPENDENT_AMBULATORY_CARE_PROVIDER_SITE_OTHER): Payer: Medicare (Managed Care)

## 2017-01-06 ENCOUNTER — Ambulatory Visit (INDEPENDENT_AMBULATORY_CARE_PROVIDER_SITE_OTHER): Payer: Medicaid Other | Admitting: Orthopaedic Surgery

## 2017-01-06 DIAGNOSIS — S82242A Displaced spiral fracture of shaft of left tibia, initial encounter for closed fracture: Secondary | ICD-10-CM

## 2017-01-06 NOTE — Progress Notes (Signed)
Patient is 2 weeks status post intramedullary nailing of a left tibia fracture. She is at a nursing facility. She is been nonweightbearing. Overall she is doing fine. Her incisions have healed. She has a small abrasion on the anterior surface of the tibia. The staples were removed. X-ray show stable alignment of the fracture and fixation. Follow-up in 6 weeks with 2 view x-rays of the left tib-fib. Continue nonweightbearing. Daily Xeroform changes to the abrasion until healed.

## 2017-02-01 ENCOUNTER — Encounter (HOSPITAL_COMMUNITY): Payer: Self-pay | Admitting: Orthopaedic Surgery

## 2017-02-01 NOTE — Anesthesia Procedure Notes (Addendum)
Anesthesia Regional Block: Popliteal block   Pre-Anesthetic Checklist: ,, timeout performed, Correct Patient, Correct Site, Correct Laterality, Correct Procedure, Correct Position, site marked, Risks and benefits discussed,  Surgical consent,  Pre-op evaluation,  At surgeon's request and post-op pain management  Laterality: Lower and Left  Prep: chloraprep       Needles:  Injection technique: Single-shot  Needle Type: Echogenic Stimulator Needle          Additional Needles:   Procedures: ultrasound guided, nerve stimulator,,,,,,   Nerve Stimulator or Paresthesia:  Response: plantar, 0.5 mA,   Additional Responses:   Narrative:  Start time: 12/19/2016 3:12 PM End time: 12/19/2016 3:21 PM Injection made incrementally with aspirations every 5 mL.  Performed by: Personally  Anesthesiologist: Makyle Eslick  Additional Notes: H+P and labs reviewed, risks and benefits discussed with patient, procedure tolerated well without complications

## 2017-02-01 NOTE — Addendum Note (Signed)
Addendum  created 02/01/17 0929 by Oleta Mouse, MD   Anesthesia Intra Blocks edited, Child order released for a procedure order, Sign clinical note

## 2017-02-03 ENCOUNTER — Encounter (HOSPITAL_COMMUNITY): Payer: Self-pay | Admitting: Orthopaedic Surgery

## 2017-02-03 MED ORDER — ROPIVACAINE HCL 5 MG/ML IJ SOLN
INTRAMUSCULAR | Status: DC | PRN
  Administered 2016-12-19: 15 mL via PERINEURAL

## 2017-02-03 MED ORDER — BUPIVACAINE-EPINEPHRINE (PF) 0.5% -1:200000 IJ SOLN
INTRAMUSCULAR | Status: DC | PRN
  Administered 2016-12-19: 30 mL via PERINEURAL

## 2017-02-03 NOTE — Addendum Note (Signed)
Addendum  created 02/03/17 1101 by Oleta Mouse, MD   Anesthesia Intra Blocks edited, Anesthesia Intra Meds edited, Child order released for a procedure order, Pend clinical note, Sign clinical note

## 2017-02-03 NOTE — Anesthesia Procedure Notes (Signed)
Anesthesia Regional Block: Adductor canal block   Pre-Anesthetic Checklist: ,, timeout performed, Correct Patient, Correct Site, Correct Laterality, Correct Procedure, Correct Position, site marked, Risks and benefits discussed,  Surgical consent,  Pre-op evaluation,  At surgeon's request and post-op pain management  Laterality: Lower and Left  Prep: chloraprep       Needles:  Injection technique: Single-shot  Needle Type: Echogenic Stimulator Needle          Additional Needles:   Procedures: ultrasound guided,,,,,,,,  Narrative:  Start time: 12/19/2016 3:12 PM End time: 12/19/2016 3:21 PM Injection made incrementally with aspirations every 5 mL.  Performed by: Personally  Anesthesiologist: Acey Woodfield  Additional Notes: H+P and labs reviewed, risks and benefits discussed with patient, procedure tolerated well without complications

## 2017-02-17 ENCOUNTER — Ambulatory Visit (INDEPENDENT_AMBULATORY_CARE_PROVIDER_SITE_OTHER): Payer: Medicaid Other | Admitting: Orthopaedic Surgery

## 2017-02-24 ENCOUNTER — Encounter (INDEPENDENT_AMBULATORY_CARE_PROVIDER_SITE_OTHER): Payer: Self-pay | Admitting: Orthopaedic Surgery

## 2017-02-24 ENCOUNTER — Ambulatory Visit (INDEPENDENT_AMBULATORY_CARE_PROVIDER_SITE_OTHER): Payer: Medicare (Managed Care) | Admitting: Orthopaedic Surgery

## 2017-02-24 ENCOUNTER — Ambulatory Visit (INDEPENDENT_AMBULATORY_CARE_PROVIDER_SITE_OTHER): Payer: Medicare (Managed Care)

## 2017-02-24 DIAGNOSIS — S82832D Other fracture of upper and lower end of left fibula, subsequent encounter for closed fracture with routine healing: Secondary | ICD-10-CM

## 2017-02-24 NOTE — Progress Notes (Signed)
Patient is 8 weeks status post intramedullary fixation of her left distal tibial shaft fracture. She is overall doing well and feeling much better. She has been nonweightbearing. She is not really complaining of any pain.  Physical exam shows fully healed surgical scars. She does have mild persistent swelling of the leg as expected for the postoperative period. Her x-ray show stable alignment with evidence of healing and callus formation.  From my standpoint she is progressing appropriately. At this point we will advance her to weight-bear as tolerated in cam walker with physical therapy. She needs to work on gait training, balance, strengthening, ADLs.  Follow-up in 6 weeks with repeat 2 view x-rays of the left tib-fib

## 2017-04-14 ENCOUNTER — Ambulatory Visit (INDEPENDENT_AMBULATORY_CARE_PROVIDER_SITE_OTHER): Payer: Medicare (Managed Care)

## 2017-04-14 ENCOUNTER — Encounter (INDEPENDENT_AMBULATORY_CARE_PROVIDER_SITE_OTHER): Payer: Self-pay | Admitting: Orthopaedic Surgery

## 2017-04-14 ENCOUNTER — Ambulatory Visit (INDEPENDENT_AMBULATORY_CARE_PROVIDER_SITE_OTHER): Payer: Medicare (Managed Care) | Admitting: Orthopaedic Surgery

## 2017-04-14 DIAGNOSIS — S82242A Displaced spiral fracture of shaft of left tibia, initial encounter for closed fracture: Secondary | ICD-10-CM

## 2017-04-14 NOTE — Progress Notes (Signed)
Office Visit Note   Patient: Alexandra Booth           Date of Birth: 1923/08/31           MRN: [id number] Visit Date: 04/14/2017              Requested by: Janifer Adie, MD [address] [place], [place] [address] PCP: Janifer Adie, MD   Assessment & Plan: Visit Diagnoses:  1. Closed displaced spiral fracture of shaft of left tibia, initial encounter     Plan: She has demonstrated healing of her tib-fib fractures.  At this point she may discontinue cam walker.  Continue with physical therapy for strengthening and gait training.  Follow-up as needed.  Follow-Up Instructions: Return if symptoms worsen or fail to improve.   Orders:  Orders Placed This Encounter  Procedures  . XR Tibia/Fibula Left   No orders of the defined types were placed in this encounter.     Procedures: No procedures performed   Clinical Data: No additional findings.   Subjective: Chief Complaint  Patient presents with  . Left Leg - Pain, Follow-up    Patient is approximately 4 months status post intramedullary fixation of a tibia fracture.  She is now living at home and going to physical therapy 3 times a week.  She wears a fracture boot for ambulation.  Denies any significant pain.    Review of Systems  Constitutional: Negative.   HENT: Negative.   Eyes: Negative.   Respiratory: Negative.   Cardiovascular: Negative.   Endocrine: Negative.   Musculoskeletal: Negative.   Neurological: Negative.   Hematological: Negative.   Psychiatric/Behavioral: Negative.   All other systems reviewed and are negative.    Objective: Vital Signs: There were no vitals taken for this visit.  Physical Exam  Constitutional: She is oriented to person, place, and time. She appears well-developed and well-nourished.  HENT:  Head: Normocephalic and atraumatic.  Eyes: EOM are normal.  Neck: Neck supple.  Pulmonary/Chest: Effort normal.  Abdominal: Soft.  Neurological: She is alert and  oriented to person, place, and time.  Skin: Skin is warm. Capillary refill takes less than 2 seconds.  Psychiatric: She has a normal mood and affect. Her behavior is normal. Judgment and thought content normal.  Nursing note and vitals reviewed.   Ortho Exam Left leg exam shows fully healed surgical scars.  She does have some dried blisters which appear to be from irritation from the fracture boot. Specialty Comments:  No specialty comments available.  Imaging: Xr Tibia/fibula Left  Result Date: 04/14/2017 Healed tibia and fibula fractures    PMFS History: Patient Active Problem List   Diagnosis Date Noted  . Fracture of tibial shaft, left, closed 12/18/2016  . Fracture of left proximal fibula 12/18/2016  . GERD (gastroesophageal reflux disease)   . Depression with anxiety   . Fever 09/14/2014  . Compression fracture of body of thoracic vertebra ([place]) 09/14/2014  . Osteoporosis 09/14/2014  . Irregular heart beats 09/14/2014  . Abnormal chest x-ray   . Acute blood loss anemia   . Macrocytic anemia   . Obesity 09/12/2014  . Fall 09/11/2014  . UTI (lower urinary tract infection) 09/11/2014  . Chronic kidney disease (CKD), stage III (moderate) ([place]) 09/11/2014  . Fracture, proximal femur ([place]) 09/11/2014  . Femur fracture, left ([place]) 09/11/2014  . Opacity of lung on imaging study 09/11/2014  . URINARY RETENTION 10/17/2008  . HYPERTENSION 09/07/2008  . HYPOKALEMIA 07/21/2008  .  DEMENTIA 07/21/2008  . KNEE PAIN, BILATERAL 05/17/2008  . ACCIDENTAL FALL, HX OF 05/17/2008  . ABDOMINAL PAIN OTHER SPECIFIED SITE 02/05/2008  . DYSPHAGIA UNSPECIFIED 01/13/2008  . Anemia 09/21/2007  . THYROIDECTOMY, HX OF 09/03/2007  . LAMINECTOMY, LUMBAR, HX OF 09/03/2007  . INTERTRIGO, CANDIDAL 06/24/2007  . CARCINOID TUMOR 05/01/2007  . CHEST PAIN 05/01/2007  . HEMORRHOIDS, EXTERNAL 12/23/2006  . 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 Hypothyroidism 04/28/2006  . T2DM  (type 2 diabetes mellitus) ([place]) 04/28/2006  . HYPERLIPIDEMIA 04/28/2006  . DEPRESSION 04/28/2006  . Coronary atherosclerosis 04/28/2006  . [place] DISEASE, LUMBAR SPINE 04/28/2006  . LUMBAR RADICULOPATHY 04/28/2006   Past Medical History:  Diagnosis Date  . Depression with anxiety   . GERD (gastroesophageal reflux disease)   . Hypothyroidism     Family History  Problem Relation Age of Onset  . Kidney disease Mother   . Kidney disease Father     Past Surgical History:  Procedure Laterality Date  . LAMINECTOMY     Social History   Occupational History  . Not on file  Tobacco Use  . Smoking status: Former Research scientist (life sciences)  . Smokeless tobacco: Never Used  Substance and Sexual Activity  . Alcohol use: No    Alcohol/week: 0.0 oz  . Drug use: No  . Sexual activity: Not on file

## 2017-04-25 ENCOUNTER — Encounter (HOSPITAL_COMMUNITY): Payer: Self-pay

## 2017-04-25 ENCOUNTER — Emergency Department (HOSPITAL_COMMUNITY): Payer: Medicare (Managed Care)

## 2017-04-25 ENCOUNTER — Ambulatory Visit
Admission: RE | Admit: 2017-04-25 | Discharge: 2017-04-25 | Disposition: A | Discharge status: Home / Self Care | Payer: Medicare (Managed Care) | Source: Ambulatory Visit | Attending: Nurse Practitioner | Admitting: Nurse Practitioner

## 2017-04-25 ENCOUNTER — Telehealth (INDEPENDENT_AMBULATORY_CARE_PROVIDER_SITE_OTHER): Payer: Self-pay | Admitting: Radiology

## 2017-04-25 ENCOUNTER — Other Ambulatory Visit: Payer: Self-pay | Admitting: Nurse Practitioner

## 2017-04-25 ENCOUNTER — Inpatient Hospital Stay (HOSPITAL_COMMUNITY)
Admission: EM | Admit: 2017-04-25 | Discharge: 2017-04-28 | DRG: 563 | Disposition: A | Discharge status: Skilled Nursing Facility | Payer: Medicare (Managed Care) | Attending: Internal Medicine | Admitting: Internal Medicine

## 2017-04-25 ENCOUNTER — Other Ambulatory Visit: Payer: Self-pay

## 2017-04-25 DIAGNOSIS — M8580 Other specified disorders of bone density and structure, unspecified site: Secondary | ICD-10-CM | POA: Diagnosis not present

## 2017-04-25 DIAGNOSIS — R918 Other nonspecific abnormal finding of lung field: Secondary | ICD-10-CM | POA: Diagnosis present

## 2017-04-25 DIAGNOSIS — Z888 Allergy status to other drugs, medicaments and biological substances status: Secondary | ICD-10-CM

## 2017-04-25 DIAGNOSIS — Y92002 Bathroom of unspecified non-institutional (private) residence single-family (private) house as the place of occurrence of the external cause: Secondary | ICD-10-CM | POA: Diagnosis not present

## 2017-04-25 DIAGNOSIS — I10 Essential (primary) hypertension: Secondary | ICD-10-CM | POA: Diagnosis present

## 2017-04-25 DIAGNOSIS — D539 Nutritional anemia, unspecified: Secondary | ICD-10-CM | POA: Diagnosis present

## 2017-04-25 DIAGNOSIS — N3281 Overactive bladder: Secondary | ICD-10-CM | POA: Diagnosis present

## 2017-04-25 DIAGNOSIS — Z880 Allergy status to penicillin: Secondary | ICD-10-CM | POA: Diagnosis not present

## 2017-04-25 DIAGNOSIS — W19XXXA Unspecified fall, initial encounter: Secondary | ICD-10-CM | POA: Diagnosis present

## 2017-04-25 DIAGNOSIS — S82121A Displaced fracture of lateral condyle of right tibia, initial encounter for closed fracture: Secondary | ICD-10-CM

## 2017-04-25 DIAGNOSIS — I2510 Atherosclerotic heart disease of native coronary artery without angina pectoris: Secondary | ICD-10-CM | POA: Diagnosis present

## 2017-04-25 DIAGNOSIS — Z79899 Other long term (current) drug therapy: Secondary | ICD-10-CM

## 2017-04-25 DIAGNOSIS — F0390 Unspecified dementia without behavioral disturbance: Secondary | ICD-10-CM | POA: Diagnosis not present

## 2017-04-25 DIAGNOSIS — M80061A Age-related osteoporosis with current pathological fracture, right lower leg, initial encounter for fracture: Secondary | ICD-10-CM | POA: Diagnosis not present

## 2017-04-25 DIAGNOSIS — E1122 Type 2 diabetes mellitus with diabetic chronic kidney disease: Secondary | ICD-10-CM | POA: Diagnosis present

## 2017-04-25 DIAGNOSIS — N183 Chronic kidney disease, stage 3 (moderate): Secondary | ICD-10-CM | POA: Diagnosis present

## 2017-04-25 DIAGNOSIS — E039 Hypothyroidism, unspecified: Secondary | ICD-10-CM | POA: Diagnosis present

## 2017-04-25 DIAGNOSIS — S82143A Displaced bicondylar fracture of unspecified tibia, initial encounter for closed fracture: Secondary | ICD-10-CM | POA: Diagnosis present

## 2017-04-25 DIAGNOSIS — D509 Iron deficiency anemia, unspecified: Secondary | ICD-10-CM | POA: Diagnosis not present

## 2017-04-25 DIAGNOSIS — F418 Other specified anxiety disorders: Secondary | ICD-10-CM | POA: Diagnosis present

## 2017-04-25 DIAGNOSIS — S82141A Displaced bicondylar fracture of right tibia, initial encounter for closed fracture: Secondary | ICD-10-CM | POA: Diagnosis present

## 2017-04-25 DIAGNOSIS — I1310 Hypertensive heart and chronic kidney disease without heart failure, with stage 1 through stage 4 chronic kidney disease, or unspecified chronic kidney disease: Secondary | ICD-10-CM | POA: Diagnosis not present

## 2017-04-25 DIAGNOSIS — F329 Major depressive disorder, single episode, unspecified: Secondary | ICD-10-CM | POA: Diagnosis present

## 2017-04-25 DIAGNOSIS — K219 Gastro-esophageal reflux disease without esophagitis: Secondary | ICD-10-CM | POA: Diagnosis present

## 2017-04-25 DIAGNOSIS — Z87310 Personal history of (healed) osteoporosis fracture: Secondary | ICD-10-CM | POA: Diagnosis not present

## 2017-04-25 DIAGNOSIS — E119 Type 2 diabetes mellitus without complications: Secondary | ICD-10-CM

## 2017-04-25 DIAGNOSIS — M810 Age-related osteoporosis without current pathological fracture: Secondary | ICD-10-CM | POA: Diagnosis present

## 2017-04-25 DIAGNOSIS — Z882 Allergy status to sulfonamides status: Secondary | ICD-10-CM

## 2017-04-25 DIAGNOSIS — Z87891 Personal history of nicotine dependence: Secondary | ICD-10-CM | POA: Diagnosis not present

## 2017-04-25 DIAGNOSIS — M1990 Unspecified osteoarthritis, unspecified site: Secondary | ICD-10-CM | POA: Diagnosis present

## 2017-04-25 DIAGNOSIS — R300 Dysuria: Secondary | ICD-10-CM | POA: Diagnosis not present

## 2017-04-25 DIAGNOSIS — I129 Hypertensive chronic kidney disease with stage 1 through stage 4 chronic kidney disease, or unspecified chronic kidney disease: Secondary | ICD-10-CM | POA: Diagnosis present

## 2017-04-25 DIAGNOSIS — W1830XA Fall on same level, unspecified, initial encounter: Secondary | ICD-10-CM | POA: Diagnosis present

## 2017-04-25 DIAGNOSIS — F339 Major depressive disorder, recurrent, unspecified: Secondary | ICD-10-CM | POA: Diagnosis not present

## 2017-04-25 DIAGNOSIS — N189 Chronic kidney disease, unspecified: Secondary | ICD-10-CM | POA: Diagnosis not present

## 2017-04-25 DIAGNOSIS — N184 Chronic kidney disease, stage 4 (severe): Secondary | ICD-10-CM | POA: Diagnosis present

## 2017-04-25 LAB — CBC WITH DIFFERENTIAL/PLATELET
Basophils Absolute: 0 10*3/uL (ref 0.0–0.1)
Basophils Relative: 0 %
EOS PCT: 4 %
Eosinophils Absolute: 0.4 10*3/uL (ref 0.0–0.7)
HEMATOCRIT: 31.3 % — AB (ref 36.0–46.0)
Hemoglobin: 10 g/dL — ABNORMAL LOW (ref 12.0–15.0)
LYMPHS PCT: 19 %
Lymphs Abs: 2 10*3/uL (ref 0.7–4.0)
MCH: 33.4 pg (ref 26.0–34.0)
MCHC: 31.9 g/dL (ref 30.0–36.0)
MCV: 104.7 fL — AB (ref 78.0–100.0)
MONO ABS: 0.8 10*3/uL (ref 0.1–1.0)
MONOS PCT: 7 %
NEUTROS ABS: 7.2 10*3/uL (ref 1.7–7.7)
Neutrophils Relative %: 70 %
Platelets: 247 10*3/uL (ref 150–400)
RBC: 2.99 MIL/uL — ABNORMAL LOW (ref 3.87–5.11)
RDW: 12.7 % (ref 11.5–15.5)
WBC: 10.5 10*3/uL (ref 4.0–10.5)

## 2017-04-25 LAB — TYPE AND SCREEN
ABO/RH(D): O POS
ANTIBODY SCREEN: POSITIVE

## 2017-04-25 LAB — COMPREHENSIVE METABOLIC PANEL
ALT: 13 U/L — ABNORMAL LOW (ref 14–54)
ANION GAP: 8 (ref 5–15)
AST: 26 U/L (ref 15–41)
Albumin: 3.2 g/dL — ABNORMAL LOW (ref 3.5–5.0)
Alkaline Phosphatase: 85 U/L (ref 38–126)
BILIRUBIN TOTAL: 0.6 mg/dL (ref 0.3–1.2)
BUN: 33 mg/dL — AB (ref 6–20)
CO2: 25 mmol/L (ref 22–32)
Calcium: 9 mg/dL (ref 8.9–10.3)
Chloride: 108 mmol/L (ref 101–111)
Creatinine, Ser: 1.76 mg/dL — ABNORMAL HIGH (ref 0.44–1.00)
GFR, EST AFRICAN AMERICAN: 28 mL/min — AB (ref >60)
GFR, EST NON AFRICAN AMERICAN: 24 mL/min — AB (ref >60)
Glucose, Bld: 115 mg/dL — ABNORMAL HIGH (ref 65–99)
POTASSIUM: 4.4 mmol/L (ref 3.5–5.1)
Sodium: 141 mmol/L (ref 135–145)
TOTAL PROTEIN: 7.5 g/dL (ref 6.5–8.1)

## 2017-04-25 LAB — URINALYSIS, ROUTINE W REFLEX MICROSCOPIC
Bilirubin Urine: NEGATIVE
GLUCOSE, UA: NEGATIVE mg/dL
HGB URINE DIPSTICK: NEGATIVE
Ketones, ur: NEGATIVE mg/dL
Nitrite: NEGATIVE
PH: 6 (ref 5.0–8.0)
Protein, ur: 100 mg/dL — AB
Specific Gravity, Urine: 1.023 (ref 1.005–1.030)

## 2017-04-25 LAB — PROTIME-INR
INR: 0.93
PROTHROMBIN TIME: 12.4 s (ref 11.4–15.2)

## 2017-04-25 LAB — CK: Total CK: 49 U/L (ref 38–234)

## 2017-04-25 NOTE — ED Notes (Signed)
Levada Dy, CNA updated on transport to [facility].

## 2017-04-25 NOTE — ED Notes (Signed)
Soup given

## 2017-04-25 NOTE — ED Triage Notes (Signed)
Pt brought in by [facility] from home. Pt had a routine [facility] appointment today where it was revealed that she had a fall. C/o R leg pain. Was sent to [facility] and was called back informing pt that she had a right tibial fracture and was transported here. Hx of dementia. A/O to baseline per EMS.

## 2017-04-25 NOTE — H&P (Signed)
Date: 04/26/2017               Patient Name:  Alexandra Booth MRN: [id number]  DOB: Oct 06, 1923 Age / Sex: 83 y.o., female   PCP: Janifer Adie, MD         Medical Service: Internal Medicine Teaching Service         Attending Physician: Dr. Evette Doffing, Mallie Mussel, *    First Contact: Dr. Maricela Bo Pager: [phone]  Second Contact: Dr. Tiburcio Pea Pager: [phone]       After Hours (After 5p/  First Contact Pager: [phone]  weekends / holidays): Second Contact Pager: [phone]   Chief Complaint: right tibial plateau fracture after mechanical fall  History of Present Illness:  Ms. Manner is a 83yo female ([facility] patient - PCP Dr. Bradd Burner) with PMH significant for recent surgery for closed left ankle fracture and left proximal tibia/fibula fracture due to mechanical fall, osteoporosis, dementia, HTN, diet-controlled diabetes, hypothyroidism, CKD, and CAD. Presented to [facility] ED after mechanical fall and found to have right tibial plateau fracture on x-ray and CT. Patient transferred to [facility] for admission by IMTS.  Patient was at home getting up to go to the bathroom when she states that her knee gave out and she fell. She complains of right leg pain, 8/10 in severity. Found to have right tibial fracture on x-ray and brought to Emergency Department by [facility]. She denies hitting her head or LOC. Endorses cough productive of nonbloody yellow sputum for the last two weeks and subjective fevers. Reports intermittent chest pain with cough and chronic stomach pain. Also has chronic dysuria - on home vesicare.  Patient was recently admitted 7/11-7/14 for left ankle and proximal tibia/fibula fracture after a mechanical fall. Underwent left tibia closed reduction and intramedullary nailing and closed treatment of left fibular shaft fracture with manipulation. Postoperative anemia - Hb of 8 on discharge (prior values 9-11). Discharged to [facility] with 28 days of Lovenox for DVT prophylaxis  (completed course). Completed follow up with Ortho outpatient - no longer on cam walker.  Also had acute spiral left femoral diaphysis fracture after a mechanical fall at home s/p closed reduction and intramedullary nailing in April 2016.  Lives at home alone. Her daughter used to live with her but she is now in a nursing home. Has home health aide come by every day to assist with medications, cooking, cleaning, and personal hygiene. Ambulates with a walker.  ED Course:  - BP 171/73, HR 74, temp 98.8, O2 98% on RA, RR 16. - Hb 10.0, Cr 1.76 (baseline ~1.6-1.9) - CXR: possible right inferior hilar mass - X-ray right knee: Acute depressed fracture of lateral tibial plateau. CT right knee: Acute, comminuted mildly depressed lateral and posterior tibial plateau fracture. Large lipohemarthrosis. - Evaluated by Dr. Lorin Mercy (Ortho), who plans on non-operative treatment with non-weightbearing, knee immobilizer, and pain control. Will provide full consult 11/17.  Meds:  Current Meds  Medication Sig  . acetaminophen (TYLENOL) 650 MG CR tablet Take 650 mg 2 (two) times daily by mouth.  . Cholecalciferol 1000 UNITS capsule Take 1,000 Units by mouth daily.  Marland Kitchen donepezil (ARICEPT) 10 MG tablet Take 10 mg by mouth at bedtime.  Marland Kitchen escitalopram (LEXAPRO) 10 MG tablet Take 10 mg by mouth daily.  Marland Kitchen HYDROcodone-acetaminophen (NORCO) 5-325 MG tablet Take 1-2 tablets by mouth every 6 (six) hours as needed. (Patient taking differently: Take 1 tablet 3 (three) times daily by mouth. )  .  iron polysaccharides (NIFEREX) 150 MG capsule Take 150 mg 2 (two) times daily by mouth.  . levothyroxine (SYNTHROID, LEVOTHROID) 25 MCG tablet Take 25 mcg by mouth daily before breakfast.  . losartan (COZAAR) 50 MG tablet Take 50 mg by mouth daily.  . Melatonin 3 MG TABS Take 1 tablet by mouth at bedtime.  . memantine (NAMENDA) 10 MG tablet Take 10 mg by mouth 2 (two) times daily.  . Multiple Vitamins-Minerals (MULTIVITAMIN WITH  MINERALS) tablet Take 1 tablet by mouth daily.  Marland Kitchen omeprazole (PRILOSEC) 40 MG capsule Take 40 mg by mouth 2 (two) times daily.  . polyethylene glycol (MIRALAX / GLYCOLAX) packet Take 17 g by mouth daily as needed for mild constipation. (Patient taking differently: Take 17 g daily by mouth. )  . senna (SENOKOT) 8.6 MG tablet Take 1 tablet by mouth 2 (two) times daily.  . solifenacin (VESICARE) 5 MG tablet Take 5 mg by mouth daily.  . vitamin B-12 (CYANOCOBALAMIN) 500 MCG tablet Take 500 mcg by mouth daily.   Allergies: Allergies as of 04/25/2017 - Review Complete 04/25/2017  Allergen Reaction Noted  . Diazepam    . Penicillins    . Sulfonamide derivatives     Past Medical History:  Diagnosis Date  . Depression with anxiety   . GERD (gastroesophageal reflux disease)   . Hypothyroidism    Family History:  Family History  Problem Relation Age of Onset  . Kidney disease Mother   . Kidney disease Father    Social History:  Social History   Socioeconomic History  . Marital status: Widowed    Spouse name: None  . Number of children: None  . Years of education: None  . Highest education level: None  Social Needs  . Financial resource strain: None  . Food insecurity - worry: None  . Food insecurity - inability: None  . Transportation needs - medical: None  . Transportation needs - non-medical: None  Occupational History  . None  Tobacco Use  . Smoking status: Former Research scientist (life sciences)  . Smokeless tobacco: Never Used  Substance and Sexual Activity  . Alcohol use: No    Alcohol/week: 0.0 oz  . Drug use: No  . Sexual activity: None  Other Topics Concern  . None  Social History Narrative   Lives alone, has home health aid that comes daily to assist with meal preparation.    Review of Systems: A complete ROS was negative except as per HPI.  Physical Exam: Blood pressure (!) 171/73, pulse 74, temperature 98.8 F (37.1 C), temperature source Oral, resp. rate 16, SpO2 98 %. GEN:  Elderly pleasant female lying comfortably in bed in NAD. Alert and oriented x3.  HENT: [place]/AT. Moist mucous membranes. No visible lesions. EYES: Sclera non-icteric. Conjunctiva clear. RESP: Clear to auscultation bilaterally. No wheezes, rales, or rhonchi. No increased work of breathing. CV: Normal rate and regular rhythm. No murmurs, gallops, or rubs. No LE edema. ABD: Soft. Mildly tender to palpation in L abdomen. Non-distended. Normoactive bowel sounds. No rebound or guarding. EXT: No edema. Warm. 1+ DP pulses bilaterally. MSK: Right knee immobilizer in place. NEURO: Cranial nerves II-XII grossly intact. Able to lift all four extremities against gravity. No apparent audiovisual hallucinations. Speech fluent and appropriate. PSYCH: Patient is calm and pleasant. Appropriate affect. Well-groomed; speech is appropriate and on-subject.  Labs CBC Latest Ref Rng & Units 04/25/2017 12/21/2016 12/20/2016  WBC 4.0 - 10.5 K/uL 10.5 10.7(H) 7.2  Hemoglobin 12.0 - 15.0 g/dL 10.0(L) 8.0(L) 8.5(L)  Hematocrit 36.0 - 46.0 % 31.3(L) 25.7(L) 26.9(L)  Platelets 150 - 400 K/uL 247 165 179   CMP Latest Ref Rng & Units 04/25/2017 12/21/2016 12/20/2016  Glucose 65 - 99 mg/dL 115(H) 112(H) 122(H)  BUN 6 - 20 mg/dL 33(H) 23(H) 18  Creatinine 0.44 - 1.00 mg/dL 1.76(H) 1.88(H) 1.53(H)  Sodium 135 - 145 mmol/L 141 140 137  Potassium 3.5 - 5.1 mmol/L 4.4 4.6 4.7  Chloride 101 - 111 mmol/L 108 111 108  CO2 22 - 32 mmol/L 25 24 23   Calcium 8.9 - 10.3 mg/dL 9.0 8.2(L) 8.0(L)  Total Protein 6.5 - 8.1 g/dL 7.5 - -  Total Bilirubin 0.3 - 1.2 mg/dL 0.6 - -  Alkaline Phos 38 - 126 U/L 85 - -  AST 15 - 41 U/L 26 - -  ALT 14 - 54 U/L 13(L) - -   Total CK 49 INR 0.93 UA with 100 protein, negative nitrites, small leukocytes, rare bacteria, 0-5 squam epithelial UCx pending  EKG: NSR, LVH  CXR: Possible right inferior hilar mass  Right knee x-ray: Depressed fracture of lateral tibial plateau; large  lipohemarthrosis  Right knee CT: Acute, comminuted mildly depressed lateral and posterior tibial plateau fracture with minimal displacement. Large lipohemarthrosis. Osteopenia.  Assessment & Plan by Problem: Active Problems:   Hypothyroidism   T2DM (type 2 diabetes mellitus) ([place])   Dementia   HYPERTENSION   Fall   Chronic kidney disease (CKD), stage III (moderate) ([facility])   Osteoporosis   GERD (gastroesophageal reflux disease)   Tibial plateau fracture, right   Pulmonary mass   Tibial plateau fracture  Ms. Lordi is a 83yo female ([facility] patient - PCP Dr. Bradd Burner) with PMH significant for dementia, recent surgery for closed left ankle fracture and left proximal tibia/fibula fracture due to mechanical fall, HTN, diet-controlled diabetes, hypothyroidism, CKD, and CAD who presents s/p mechanical fall today with right tibial plateau fracture, confirmed by imaging.  Right lateral and posterior tibial plateau fracture In setting of osteoporosis s/p mechanical fall today. Has been evaluated by Dr. Lorin Mercy in the ED. Plan for non-operative treatment for now - Ortho to evaluate 11/17 - Non-weight bearing and knee immobilizer - IV morphine 4mg  q3h PRN; home Norco 5-325mg  q6h PRN for pain - PT/OT consult - Obtain records from [facility] in AM  Right inferior hilar mass Noted on AP semi-erect CXR. Renal function stable (Cr 1.76, at baseline). Patient is complaining of productive cough and subjective fevers for the last 2 weeks, but VSS and afebrile here. No leukocytosis. - CT with contrast when able to evaluate mass (with pre-administration of IV NS bolus due to CKD)  Chronic Macrocytic Anemia Stable. Hb 8 on discharge on 7/14. Hb now at 10. Baseline ~9-11. - Continue to monitor - Continue home iron and B12  HTN BP elevated. On losartan at home. - Resume home losartan 50mg  daily  Chronic dysuria Was on pyridium previously and is on darifenacin for overactive bladder. Difficult to assess urinary  symptoms. UA drawn in the ER not convincing for infection. UCx pending. No need for antibiotics at this time. - Follow up UCx - Continue home darifenacin  Non-insulin dependent T2DM Last A1c in our records is 5.6 in 2016. Not on home medications. - Monitor blood sugars - SSI-S  Depression - Contine home escitalopram  Dementia - Continue memantine and aricept  Diet: Regular VTE PPx: Lovenox Code: DNR/DNI Dispo: Admit patient to Inpatient with expected length of stay greater than 2 midnights.  Signed: Ronalee Red,  Anderson [place], MD 04/26/2017, 1:50 AM

## 2017-04-25 NOTE — ED Notes (Signed)
ED TO INPATIENT HANDOFF REPORT  Name/Age/Gender Alexandra Booth 83 y.o. female  Code Status Code Status History    Date Active Date Inactive Code Status Order ID Comments User Context   12/18/2016 19:30 12/21/2016 18:59 DNR [id number]  Ivor Costa, MD ED   09/12/2014 21:17 09/19/2014 20:38 Full Code [id number]  Leandrew Koyanagi, MD Inpatient   09/11/2014 15:44 09/12/2014 21:17 Full Code [id number]  Juluis Mire, MD Inpatient    Questions for Most Recent Historical Code Status (Order [id number])    Question Answer Comment   In the event of cardiac or respiratory ARREST Do not call a "code blue"    In the event of cardiac or respiratory ARREST Do not perform Intubation, CPR, defibrillation or ACLS    In the event of cardiac or respiratory ARREST Use medication by any route, position, wound care, and other measures to relive pain and suffering. May use oxygen, suction and manual treatment of airway obstruction as needed for comfort.       Home/SNF/Other Home  Chief Complaint rt leg pain  Level of Care/Admitting Diagnosis ED Disposition    ED Disposition Condition [facility]: [place] [[id number]]  Level of Care: Med-Surg [16]  Diagnosis: Tibial plateau fracture [817711]  Admitting Physician: Axel Filler [[id number]]  Attending Physician: Axel Filler [[id number]]  Estimated length of stay: past midnight tomorrow  Certification:: I certify this patient will need inpatient services for at least 2 midnights  PT Class (Do Not Modify): Inpatient [101]  PT Acc Code (Do Not Modify): Private [1]       Medical History Past Medical History:  Diagnosis Date  . Depression with anxiety   . GERD (gastroesophageal reflux disease)   . Hypothyroidism     Allergies Allergies  Allergen Reactions  . Diazepam     REACTION: Unknown reaction  . Penicillins     REACTION: rash, itching  . Sulfonamide Derivatives     REACTION: GI upset    IV  Location/Drains/Wounds Patient Lines/Drains/Airways Status   Active Line/Drains/Airways    Name:   Placement date:   Placement time:   Site:   Days:   Peripheral IV 04/25/17 Left Antecubital   04/25/17    2009    Antecubital   less than 1   External Urinary Catheter   12/21/16    0532    -   125   External Urinary Catheter   04/25/17    1837    -   less than 1   Incision (Closed) 09/12/14 Hip Left   09/12/14    1750     956   Incision (Closed) 12/19/16 Leg Left   12/19/16    1724     127   Incision (Closed) 12/19/16 Leg Left   12/19/16    1724     127   Incision (Closed) 12/19/16 Leg Left   12/19/16    1724     127   Incision (Closed) 12/19/16 Leg   12/19/16    1726     127          Labs/Imaging Results for orders placed or performed during the hospital encounter of 04/25/17 (from the past 48 hour(s))  CBC with Differential/Platelet     Status: Abnormal   Collection Time: 04/25/17  7:30 PM  Result Value Ref Range   WBC 10.5 4.0 - 10.5 K/uL   RBC 2.99 (L) 3.87 - 5.11 MIL/uL  Hemoglobin 10.0 (L) 12.0 - 15.0 g/dL   HCT 31.3 (L) 36.0 - 46.0 %   MCV 104.7 (H) 78.0 - 100.0 fL   MCH 33.4 26.0 - 34.0 pg   MCHC 31.9 30.0 - 36.0 g/dL   RDW 12.7 11.5 - 15.5 %   Platelets 247 150 - 400 K/uL   Neutrophils Relative % 70 %   Neutro Abs 7.2 1.7 - 7.7 K/uL   Lymphocytes Relative 19 %   Lymphs Abs 2.0 0.7 - 4.0 K/uL   Monocytes Relative 7 %   Monocytes Absolute 0.8 0.1 - 1.0 K/uL   Eosinophils Relative 4 %   Eosinophils Absolute 0.4 0.0 - 0.7 K/uL   Basophils Relative 0 %   Basophils Absolute 0.0 0.0 - 0.1 K/uL  Comprehensive metabolic panel     Status: Abnormal   Collection Time: 04/25/17  7:30 PM  Result Value Ref Range   Sodium 141 135 - 145 mmol/L   Potassium 4.4 3.5 - 5.1 mmol/L   Chloride 108 101 - 111 mmol/L   CO2 25 22 - 32 mmol/L   Glucose, Bld 115 (H) 65 - 99 mg/dL   BUN 33 (H) 6 - 20 mg/dL   Creatinine, Ser 1.76 (H) 0.44 - 1.00 mg/dL   Calcium 9.0 8.9 - 10.3 mg/dL    Total Protein 7.5 6.5 - 8.1 g/dL   Albumin 3.2 (L) 3.5 - 5.0 g/dL   AST 26 15 - 41 U/L   ALT 13 (L) 14 - 54 U/L   Alkaline Phosphatase 85 38 - 126 U/L   Total Bilirubin 0.6 0.3 - 1.2 mg/dL   GFR calc non Af Amer 24 (L) >60 mL/min   GFR calc Af Amer 28 (L) >60 mL/min    Comment: (NOTE) The eGFR has been calculated using the CKD EPI equation. This calculation has not been validated in all clinical situations. eGFR's persistently <60 mL/min signify possible Chronic Kidney Disease.    Anion gap 8 5 - 15  Protime-INR     Status: None   Collection Time: 04/25/17  7:30 PM  Result Value Ref Range   Prothrombin Time 12.4 11.4 - 15.2 seconds   INR 0.93   CK     Status: None   Collection Time: 04/25/17  7:30 PM  Result Value Ref Range   Total CK 49 38 - 234 U/L  Type and screen     Status: None   Collection Time: 04/25/17  8:04 PM  Result Value Ref Range   ABO/RH(D) O POS    Antibody Screen POS    Sample Expiration 04/28/2017    Antibody Identification ANTI C   Urinalysis, Routine w reflex microscopic     Status: Abnormal   Collection Time: 04/25/17  9:14 PM  Result Value Ref Range   Color, Urine YELLOW YELLOW   APPearance CLEAR CLEAR   Specific Gravity, Urine 1.023 1.005 - 1.030   pH 6.0 5.0 - 8.0   Glucose, UA NEGATIVE NEGATIVE mg/dL   Hgb urine dipstick NEGATIVE NEGATIVE   Bilirubin Urine NEGATIVE NEGATIVE   Ketones, ur NEGATIVE NEGATIVE mg/dL   Protein, ur 100 (A) NEGATIVE mg/dL   Nitrite NEGATIVE NEGATIVE   Leukocytes, UA SMALL (A) NEGATIVE   RBC / HPF 0-5 0 - 5 RBC/hpf   WBC, UA 0-5 0 - 5 WBC/hpf   Bacteria, UA RARE (A) NONE SEEN   Squamous Epithelial / LPF 0-5 (A) NONE SEEN   Dg Tibia/fibula Right  Result Date:  04/25/2017 CLINICAL DATA:  Fall. EXAM: RIGHT TIBIA AND FIBULA - 2 VIEW COMPARISON:  Knee series 04/25/2017 and 10/08/2016 . FINDINGS: No acute soft tissue bony scratched it no acute bony abnormality identified. No evidence of fracture or dislocation. Diffuse  soft tissue swelling. Peripheral vascular calcification. IMPRESSION: Diffuse soft tissue swelling. No acute bony abnormality. No evidence of fracture. Electronically Signed   By: Marcello Moores  Register   On: 04/25/2017 15:18   Ct Knee Right Wo Contrast  Result Date: 04/25/2017 CLINICAL DATA:  Knee fracture EXAM: CT OF THE right KNEE WITHOUT CONTRAST TECHNIQUE: Multidetector CT imaging of the right knee was performed according to the standard protocol. Multiplanar CT image reconstructions were also generated. COMPARISON:  Radiograph 04/25/2017 FINDINGS: Bones/Joint/Cartilage Osteopenia limits the exam. Acute, mildly comminuted lateral and posterior tibial plateau fracture with approximately 4 mm of depression poste. Minimal posterior displacement of fracture fragment. On coronal views, fracture appears to extend into the proximal lateral tibial metaphysis and the interspinous region of the tibia. No other discrete fracture is visualized. Moderate to large like both hemarthrosis. Calcifications within the lateral joint space. Sclerosis in the distal femoral condyles, likely degenerative. Ligaments Suboptimally assessed by CT. Muscles and Tendons Diffuse atrophy of the muscles. Quadriceps and patellar tendons appear. Soft tissues Moderate edema within the lateral soft tissues. Vascular calcifications. IMPRESSION: 1. Osteopenia limits the exam. 2. Acute, comminuted mildly depressed lateral and posterior tibial plateau fracture with minimal displacement. Large like lipohemarthrosis 3. Probable chondrocalcinosis of the lateral joint space. Electronically Signed   By: Donavan Foil M.D.   On: 04/25/2017 19:59   Dg Chest Port 1 View  Result Date: 04/25/2017 CLINICAL DATA:  Right tibial fracture following a fall. EXAM: PORTABLE CHEST 1 VIEW COMPARISON:  11/05/2016.  Chest CT dated 09/14/2014. FINDINGS: Increased prominence of the inferior right hilum with a rounded configuration. Mildly enlarged cardiac silhouette. Clear  lungs with mildly prominent interstitial markings and mild diffuse peribronchial thickening. Diffuse osteopenia. Moderate-to-marked left glenohumeral joint degenerative changes. Milder right glenohumeral joint degenerative changes. IMPRESSION: 1. Possible right inferior hilar mass. Followup PA and lateral views are recommended when possible. Alternatively, this could be further evaluated with a chest CT with contrast. 2. Stable mild chronic bronchitic changes. 3. Mild cardiomegaly. Electronically Signed   By: Claudie Revering M.D.   On: 04/25/2017 20:13   Dg Knee Complete 4 Views Right  Result Date: 04/25/2017 CLINICAL DATA:  Lateral knee pain after fall 1 week ago. EXAM: RIGHT KNEE - COMPLETE 4+ VIEW COMPARISON:  Right knee x-rays dated Oct 08, 2016. FINDINGS: Acute, depressed fracture of the lateral tibial plateau. A fracture line seen extending into the proximal tibial metaphysis. Large lipohemarthrosis. Mild tricompartmental degenerative changes and chondrocalcinosis. Diffuse osteopenia. IMPRESSION: Acute, depressed fracture of the lateral tibial plateau. Large lipohemarthrosis. Electronically Signed   By: Titus Dubin M.D.   On: 04/25/2017 15:20    Pending Labs Unresulted Labs (From admission, onward)   Start     Ordered   04/25/17 1930  Urine Culture  STAT,   STAT     04/25/17 1929      Vitals/Pain Today's Vitals   04/25/17 2100 04/25/17 2130 04/25/17 2200 04/25/17 2230  BP: (!) 193/75 (!) 162/82 (!) 185/72 (!) 188/61  Pulse: 83 86 78 76  Resp: '19  17 18  ' Temp:      TempSrc:      SpO2: 95% 95% 97% 95%    Isolation Precautions No active isolations  Medications Medications - No data  to display  Mobility walks with device

## 2017-04-25 NOTE — Progress Notes (Signed)
Reviewed xrays and CT scan of knee. Has slight depression . Will plan on Non op Tx with NWB and transfers.  Full consult to follow Saturday. Discussed plan with patient and reviewed CT and xrays with her. Knee immobilizer.  My cell [phone]

## 2017-04-25 NOTE — ED Notes (Signed)
Bed: XB26 Expected date:  Expected time:  Means of arrival:  Comments: EMS 83 y/o tibial fx

## 2017-04-25 NOTE — Telephone Encounter (Signed)
Beverly from [facility] calling NP, about patient, with new acute depressed lateral tib plateau fracture. Patient is not currently with them, they just got the report shortly before calling us. They are unable to transport patient to office before 500pm. Advised per Dr. Lorin Mercy recommend go to ER, most likely will need knee immobilizer and non weightbearing.

## 2017-04-25 NOTE — ED Notes (Signed)
X-ray in progress 

## 2017-04-25 NOTE — ED Notes (Addendum)
Levada Dy CNA, [phone].

## 2017-04-26 DIAGNOSIS — Z841 Family history of disorders of kidney and ureter: Secondary | ICD-10-CM

## 2017-04-26 DIAGNOSIS — Z885 Allergy status to narcotic agent status: Secondary | ICD-10-CM

## 2017-04-26 DIAGNOSIS — Z87891 Personal history of nicotine dependence: Secondary | ICD-10-CM

## 2017-04-26 DIAGNOSIS — Z882 Allergy status to sulfonamides status: Secondary | ICD-10-CM

## 2017-04-26 DIAGNOSIS — R300 Dysuria: Secondary | ICD-10-CM

## 2017-04-26 DIAGNOSIS — Z87310 Personal history of (healed) osteoporosis fracture: Secondary | ICD-10-CM

## 2017-04-26 DIAGNOSIS — I2510 Atherosclerotic heart disease of native coronary artery without angina pectoris: Secondary | ICD-10-CM

## 2017-04-26 DIAGNOSIS — D509 Iron deficiency anemia, unspecified: Secondary | ICD-10-CM

## 2017-04-26 DIAGNOSIS — N189 Chronic kidney disease, unspecified: Secondary | ICD-10-CM

## 2017-04-26 DIAGNOSIS — I129 Hypertensive chronic kidney disease with stage 1 through stage 4 chronic kidney disease, or unspecified chronic kidney disease: Secondary | ICD-10-CM

## 2017-04-26 DIAGNOSIS — F339 Major depressive disorder, recurrent, unspecified: Secondary | ICD-10-CM

## 2017-04-26 DIAGNOSIS — M80061A Age-related osteoporosis with current pathological fracture, right lower leg, initial encounter for fracture: Secondary | ICD-10-CM

## 2017-04-26 DIAGNOSIS — Z880 Allergy status to penicillin: Secondary | ICD-10-CM

## 2017-04-26 DIAGNOSIS — Z66 Do not resuscitate: Secondary | ICD-10-CM

## 2017-04-26 DIAGNOSIS — Z79899 Other long term (current) drug therapy: Secondary | ICD-10-CM

## 2017-04-26 LAB — GLUCOSE, CAPILLARY
Glucose-Capillary: 105 mg/dL — ABNORMAL HIGH (ref 65–99)
Glucose-Capillary: 136 mg/dL — ABNORMAL HIGH (ref 65–99)
Glucose-Capillary: 141 mg/dL — ABNORMAL HIGH (ref 65–99)
Glucose-Capillary: 139 mg/dL — ABNORMAL HIGH (ref 65–99)

## 2017-04-26 MED ORDER — CYANOCOBALAMIN 500 MCG PO TABS
500.0000 ug | ORAL_TABLET | Freq: Every day | ORAL | Status: DC
  Administered 2017-04-26 – 2017-04-28 (×3): 500 ug via ORAL
  Filled 2017-04-26 (×3): qty 1

## 2017-04-26 MED ORDER — PANTOPRAZOLE SODIUM 40 MG PO TBEC
40.0000 mg | DELAYED_RELEASE_TABLET | Freq: Every day | ORAL | Status: DC
  Administered 2017-04-26 – 2017-04-28 (×3): 40 mg via ORAL
  Filled 2017-04-26 (×3): qty 1

## 2017-04-26 MED ORDER — POLYETHYL GLYCOL-PROPYL GLYCOL 0.4-0.3 % OP SOLN: 1.0000 [drp] | Freq: Two times a day (BID) | OPHTHALMIC | Status: DC | PRN

## 2017-04-26 MED ORDER — ESCITALOPRAM OXALATE 10 MG PO TABS
10.0000 mg | ORAL_TABLET | Freq: Every day | ORAL | Status: DC
  Administered 2017-04-26 – 2017-04-28 (×3): 10 mg via ORAL
  Filled 2017-04-26 (×3): qty 1

## 2017-04-26 MED ORDER — ACETAMINOPHEN 650 MG RE SUPP: 650.0000 mg | Freq: Four times a day (QID) | RECTAL | Status: DC | PRN

## 2017-04-26 MED ORDER — MORPHINE SULFATE (PF) 4 MG/ML IV SOLN
4.0000 mg | INTRAVENOUS | Status: DC | PRN
  Administered 2017-04-27: 4 mg via INTRAVENOUS
  Filled 2017-04-26 (×2): qty 1

## 2017-04-26 MED ORDER — POLYETHYLENE GLYCOL 3350 17 G PO PACK
17.0000 g | PACK | Freq: Every day | ORAL | Status: DC | PRN
  Administered 2017-04-28: 17 g via ORAL
  Filled 2017-04-26: qty 1

## 2017-04-26 MED ORDER — LEVOTHYROXINE SODIUM 25 MCG PO TABS
25.0000 ug | ORAL_TABLET | Freq: Every day | ORAL | Status: DC
  Administered 2017-04-26 – 2017-04-28 (×3): 25 ug via ORAL
  Filled 2017-04-26 (×3): qty 1

## 2017-04-26 MED ORDER — ADULT MULTIVITAMIN W/MINERALS CH
1.0000 | ORAL_TABLET | Freq: Every day | ORAL | Status: DC
  Administered 2017-04-26 – 2017-04-28 (×3): 1 via ORAL
  Filled 2017-04-26 (×3): qty 1

## 2017-04-26 MED ORDER — MEMANTINE HCL 10 MG PO TABS
10.0000 mg | ORAL_TABLET | Freq: Two times a day (BID) | ORAL | Status: DC
  Administered 2017-04-26 – 2017-04-28 (×5): 10 mg via ORAL
  Filled 2017-04-26 (×5): qty 1

## 2017-04-26 MED ORDER — MORPHINE SULFATE (PF) 4 MG/ML IV SOLN: 4.0000 mg | INTRAVENOUS | Status: DC | PRN

## 2017-04-26 MED ORDER — POLYSACCHARIDE IRON COMPLEX 150 MG PO CAPS
150.0000 mg | ORAL_CAPSULE | Freq: Two times a day (BID) | ORAL | Status: DC
  Administered 2017-04-26 – 2017-04-28 (×5): 150 mg via ORAL
  Filled 2017-04-26 (×5): qty 1

## 2017-04-26 MED ORDER — POLYVINYL ALCOHOL 1.4 % OP SOLN
1.0000 [drp] | Freq: Two times a day (BID) | OPHTHALMIC | Status: DC | PRN
  Administered 2017-04-28: 1 [drp] via OPHTHALMIC
  Filled 2017-04-26: qty 15

## 2017-04-26 MED ORDER — VITAMIN D 1000 UNITS PO TABS
1000.0000 [IU] | ORAL_TABLET | Freq: Every day | ORAL | Status: DC
  Administered 2017-04-26 – 2017-04-28 (×3): 1000 [IU] via ORAL
  Filled 2017-04-26 (×3): qty 1

## 2017-04-26 MED ORDER — MELATONIN 3 MG PO TABS
1.0000 | ORAL_TABLET | Freq: Every day | ORAL | Status: DC
  Administered 2017-04-26 – 2017-04-27 (×3): 3 mg via ORAL
  Filled 2017-04-26 (×4): qty 1

## 2017-04-26 MED ORDER — HYDROCODONE-ACETAMINOPHEN 5-325 MG PO TABS
1.0000 | ORAL_TABLET | Freq: Four times a day (QID) | ORAL | Status: DC | PRN
  Administered 2017-04-26 – 2017-04-28 (×5): 2 via ORAL
  Filled 2017-04-26 (×3): qty 2
  Filled 2017-04-26: qty 1
  Filled 2017-04-26 (×2): qty 2

## 2017-04-26 MED ORDER — LOSARTAN POTASSIUM 50 MG PO TABS
50.0000 mg | ORAL_TABLET | Freq: Every day | ORAL | Status: DC
  Administered 2017-04-26 – 2017-04-28 (×3): 50 mg via ORAL
  Filled 2017-04-26 (×3): qty 1

## 2017-04-26 MED ORDER — ONDANSETRON HCL 4 MG/2ML IJ SOLN
4.0000 mg | Freq: Once | INTRAMUSCULAR | Status: AC
  Administered 2017-04-26: 4 mg via INTRAVENOUS
  Filled 2017-04-26: qty 2

## 2017-04-26 MED ORDER — INSULIN ASPART 100 UNIT/ML ~~LOC~~ SOLN
0.0000 [IU] | Freq: Three times a day (TID) | SUBCUTANEOUS | Status: DC
  Administered 2017-04-26: 2 [IU] via SUBCUTANEOUS
  Administered 2017-04-26 – 2017-04-27 (×2): 1 [IU] via SUBCUTANEOUS
  Administered 2017-04-27: 2 [IU] via SUBCUTANEOUS
  Administered 2017-04-28 (×3): 1 [IU] via SUBCUTANEOUS

## 2017-04-26 MED ORDER — ALPRAZOLAM 0.5 MG PO TABS
0.5000 mg | ORAL_TABLET | Freq: Every day | ORAL | Status: DC
  Administered 2017-04-26 – 2017-04-27 (×2): 0.5 mg via ORAL
  Filled 2017-04-26 (×2): qty 1

## 2017-04-26 MED ORDER — ACETAMINOPHEN 325 MG PO TABS
650.0000 mg | ORAL_TABLET | Freq: Four times a day (QID) | ORAL | Status: DC | PRN
  Filled 2017-04-26: qty 2

## 2017-04-26 MED ORDER — MORPHINE SULFATE (PF) 4 MG/ML IV SOLN
4.0000 mg | INTRAVENOUS | Status: DC | PRN
  Administered 2017-04-26: 4 mg via INTRAVENOUS
  Filled 2017-04-26: qty 1

## 2017-04-26 MED ORDER — DARIFENACIN HYDROBROMIDE ER 7.5 MG PO TB24
7.5000 mg | ORAL_TABLET | Freq: Every day | ORAL | Status: DC
  Administered 2017-04-26 – 2017-04-28 (×3): 7.5 mg via ORAL
  Filled 2017-04-26 (×3): qty 1

## 2017-04-26 MED ORDER — ENOXAPARIN SODIUM 30 MG/0.3ML ~~LOC~~ SOLN
30.0000 mg | SUBCUTANEOUS | Status: DC
  Administered 2017-04-27 – 2017-04-28 (×2): 30 mg via SUBCUTANEOUS
  Filled 2017-04-26 (×2): qty 0.3

## 2017-04-26 MED ORDER — GUAIFENESIN-DM 100-10 MG/5ML PO SYRP
5.0000 mL | ORAL_SOLUTION | ORAL | Status: DC | PRN
  Administered 2017-04-26 – 2017-04-27 (×2): 5 mL via ORAL
  Filled 2017-04-26 (×2): qty 5

## 2017-04-26 MED ORDER — SENNA 8.6 MG PO TABS
1.0000 | ORAL_TABLET | Freq: Two times a day (BID) | ORAL | Status: DC
  Administered 2017-04-26 – 2017-04-28 (×5): 8.6 mg via ORAL
  Filled 2017-04-26 (×5): qty 1

## 2017-04-26 MED ORDER — DONEPEZIL HCL 10 MG PO TABS
10.0000 mg | ORAL_TABLET | Freq: Every day | ORAL | Status: DC
  Administered 2017-04-26 – 2017-04-27 (×3): 10 mg via ORAL
  Filled 2017-04-26 (×3): qty 1

## 2017-04-26 MED ORDER — ENOXAPARIN SODIUM 40 MG/0.4ML ~~LOC~~ SOLN
40.0000 mg | SUBCUTANEOUS | Status: DC
  Administered 2017-04-26: 40 mg via SUBCUTANEOUS
  Filled 2017-04-26: qty 0.4

## 2017-04-26 NOTE — ED Notes (Signed)
Report given to [place] at [facility]. Care link in route

## 2017-04-26 NOTE — Progress Notes (Signed)
PT Cancellation Note  Patient Details Name: Alexandra Booth MRN: [id number] DOB: 1924-05-03   Cancelled Treatment:    Reason Eval/Treat Not Completed: Medical issues which prohibited therapy. Pt with orders for strict bedrest. Please update activity order, when appropriate, for PT to proceed with eval.    Lorriane Shire 04/26/2017, 8:17 AM

## 2017-04-26 NOTE — ED Provider Notes (Signed)
[facility] DEPT Provider Note   CSN: [id number] Arrival date & time: 04/25/17  1748     History   Chief Complaint Chief Complaint  Patient presents with  . Leg Pain  . Fall    HPI Alexandra Booth is a 83 y.o. female. Chief complaint is right leg injury  HPI 83 year old female. Home bound. She is a [facility] patient. Gets every other day home care. Fell 4 days ago. Had outpatient x-rays obtained today showing a right tibial plateau fracture and was referred here.  Normally ambulates with walker and assist.  Had left tibial fracture treated with IM rod the summer.  Was in bed. Was not on the floor incapacitated with this injury.  Past Medical History:  Diagnosis Date  . Depression with anxiety   . GERD (gastroesophageal reflux disease)   . Hypothyroidism     Patient Active Problem List   Diagnosis Date Noted  . Tibial plateau fracture, right 04/25/2017  . Pulmonary mass 04/25/2017  . Tibial plateau fracture 04/25/2017  . Fracture of tibial shaft, left, closed 12/18/2016  . Fracture of left proximal fibula 12/18/2016  . GERD (gastroesophageal reflux disease)   . Depression with anxiety   . Fever 09/14/2014  . Compression fracture of body of thoracic vertebra ([place]) 09/14/2014  . Osteoporosis 09/14/2014  . Irregular heart beats 09/14/2014  . Abnormal chest x-ray   . Acute blood loss anemia   . Macrocytic anemia   . Obesity 09/12/2014  . Fall 09/11/2014  . UTI (lower urinary tract infection) 09/11/2014  . Chronic kidney disease (CKD), stage III (moderate) ([place]) 09/11/2014  . Fracture, proximal femur ([place]) 09/11/2014  . Femur fracture, left ([place]) 09/11/2014  . Opacity of lung on imaging study 09/11/2014  . URINARY RETENTION 10/17/2008  . HYPERTENSION 09/07/2008  . HYPOKALEMIA 07/21/2008  . Dementia 07/21/2008  . KNEE PAIN, BILATERAL 05/17/2008  . ACCIDENTAL FALL, HX OF 05/17/2008  . ABDOMINAL PAIN OTHER SPECIFIED SITE 02/05/2008  .  DYSPHAGIA UNSPECIFIED 01/13/2008  . Anemia 09/21/2007  . THYROIDECTOMY, HX OF 09/03/2007  . LAMINECTOMY, LUMBAR, HX OF 09/03/2007  . INTERTRIGO, CANDIDAL 06/24/2007  . CARCINOID TUMOR 05/01/2007  . CHEST PAIN 05/01/2007  . HEMORRHOIDS, EXTERNAL 12/23/2006  . 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 Hypothyroidism 04/28/2006  . T2DM (type 2 diabetes mellitus) ([place]) 04/28/2006  . HYPERLIPIDEMIA 04/28/2006  . DEPRESSION 04/28/2006  . Coronary atherosclerosis 04/28/2006  . [place] DISEASE, LUMBAR SPINE 04/28/2006  . LUMBAR RADICULOPATHY 04/28/2006    Past Surgical History:  Procedure Laterality Date  . INTRAMEDULLARY (IM) NAIL FEMORAL Left 09/12/2014   Performed by Leandrew Koyanagi, MD at [facility]  . INTRAMEDULLARY (IM) NAIL TIBIAL Left 12/19/2016   Performed by Leandrew Koyanagi, MD at [facility]      OB History    No data available       Home Medications    Prior to Admission medications   Medication Sig Start Date End Date Taking? Authorizing Provider  acetaminophen (TYLENOL) 650 MG CR tablet Take 650 mg 2 (two) times daily by mouth.   Yes [provider]  Cholecalciferol 1000 UNITS capsule Take 1,000 Units by mouth daily.   Yes [provider]  donepezil (ARICEPT) 10 MG tablet Take 10 mg by mouth at bedtime.   Yes [provider]  escitalopram (LEXAPRO) 10 MG tablet Take 10 mg by mouth daily.   Yes [provider]  HYDROcodone-acetaminophen (NORCO) 5-325 MG tablet Take 1-2 tablets by mouth every 6 (six) hours as needed. Patient taking differently: Take 1 tablet 3 (three) times daily by mouth.  12/19/16  Yes Leandrew Koyanagi, MD  iron polysaccharides (NIFEREX) 150 MG capsule Take 150 mg 2 (two) times daily by mouth.   Yes [provider]  levothyroxine (SYNTHROID, LEVOTHROID) 25 MCG tablet Take 25 mcg by mouth daily before breakfast.   Yes [provider]  losartan (COZAAR) 50 MG tablet Take 50 mg by  mouth daily.   Yes [provider]  Melatonin 3 MG TABS Take 1 tablet by mouth at bedtime.   Yes [provider]  memantine (NAMENDA) 10 MG tablet Take 10 mg by mouth 2 (two) times daily.   Yes [provider]  Multiple Vitamins-Minerals (MULTIVITAMIN WITH MINERALS) tablet Take 1 tablet by mouth daily.   Yes [provider]  omeprazole (PRILOSEC) 40 MG capsule Take 40 mg by mouth 2 (two) times daily.   Yes [provider]  polyethylene glycol (MIRALAX / GLYCOLAX) packet Take 17 g by mouth daily as needed for mild constipation. Patient taking differently: Take 17 g daily by mouth.  12/21/16  Yes Oswald Hillock, MD  senna (SENOKOT) 8.6 MG tablet Take 1 tablet by mouth 2 (two) times daily.   Yes [provider]  solifenacin (VESICARE) 5 MG tablet Take 5 mg by mouth daily.   Yes [provider]  vitamin B-12 (CYANOCOBALAMIN) 500 MCG tablet Take 500 mcg by mouth daily.   Yes [provider]  ALPRAZolam (XANAX) 0.5 MG tablet Take 1 tablet (0.5 mg total) by mouth at bedtime. 12/21/16   Oswald Hillock, MD  aspirin 81 MG chewable tablet Chew 1 tablet (81 mg total) by mouth daily. Patient not taking: Reported on 04/25/2017 12/22/16   Oswald Hillock, MD  calcium carbonate (OS-CAL - DOSED IN MG OF ELEMENTAL CALCIUM) 1250 (500 CA) MG tablet Take 1 tablet (500 mg of elemental calcium total) by mouth daily with breakfast. Patient not taking: Reported on 04/25/2017 09/19/14   Juluis Mire, MD  enoxaparin (LOVENOX) 30 MG/0.3ML injection Inject 0.3 mLs (30 mg total) into the skin daily. Patient not taking: Reported on 04/25/2017 12/19/16   Leandrew Koyanagi, MD  feeding supplement, ENSURE ENLIVE, (ENSURE ENLIVE) LIQD Take 237 mLs by mouth 2 (two) times daily between meals. Patient not taking: Reported on 04/25/2017 09/19/14   Juluis Mire, MD  Menthol, Topical Analgesic, (BIOFREEZE EX) Apply 1 application topically 3 (three) times daily. 3% Apply to  knees    [provider]  nystatin (NYSTATIN) powder Apply 1 Bottle topically 3 (three) times daily as needed (skin irritation).    [provider]  phenazopyridine (PYRIDIUM) 200 MG tablet Take 200 mg by mouth daily as needed for pain (painful urination).    [provider]  Polyethyl Glycol-Propyl Glycol (SYSTANE) 0.4-0.3 % SOLN Apply 1 drop to eye 2 (two) times daily as needed (dry eyes).    [provider]    Family History Family History  Problem Relation Age of Onset  . Kidney disease Mother   . Kidney disease Father     Social History Social History   Tobacco Use  . Smoking status: Former Research scientist (life sciences)  . Smokeless tobacco: Never Used  Substance Use Topics  . Alcohol use: No    Alcohol/week: 0.0 oz  . Drug use: No     Allergies   Diazepam; Penicillins; and Sulfonamide  derivatives   Review of Systems Review of Systems  Constitutional: Negative for appetite change, chills, diaphoresis, fatigue and fever.  HENT: Negative for mouth sores, sore throat and trouble swallowing.   Eyes: Negative for visual disturbance.  Respiratory: Negative for cough, chest tightness, shortness of breath and wheezing.   Cardiovascular: Negative for chest pain.  Gastrointestinal: Negative for abdominal distention, abdominal pain, diarrhea, nausea and vomiting.  Endocrine: Negative for polydipsia, polyphagia and polyuria.  Genitourinary: Negative for dysuria, frequency and hematuria.  Musculoskeletal: Negative for gait problem.       Right leg pain  Skin: Negative for color change, pallor and rash.  Neurological: Negative for dizziness, syncope, light-headedness and headaches.  Hematological: Does not bruise/bleed easily.  Psychiatric/Behavioral: Negative for behavioral problems and confusion.     Physical Exam Updated Vital Signs BP (!) 189/72   Pulse 66   Temp 98.7 F (37.1 C) (Oral)   Resp 16   SpO2 95%   Physical Exam  Constitutional: She is  oriented to person, place, and time. She appears well-developed and well-nourished. No distress.  HENT:  Head: Normocephalic.  Eyes: Conjunctivae are normal. Pupils are equal, round, and reactive to light. No scleral icterus.  Neck: Normal range of motion. Neck supple. No thyromegaly present.  Cardiovascular: Normal rate and regular rhythm. Exam reveals no gallop and no friction rub.  No murmur heard. Pulmonary/Chest: Effort normal and breath sounds normal. No respiratory distress. She has no wheezes. She has no rales.  Abdominal: Soft. Bowel sounds are normal. She exhibits no distension. There is no tenderness. There is no rebound.  Musculoskeletal: Normal range of motion.  Soft tissue swelling tenderness at right lower leg area over lateral tibial plateau.  Neurological: She is alert and oriented to person, place, and time.  Skin: Skin is warm and dry. No rash noted.  Psychiatric: She has a normal mood and affect. Her behavior is normal.     ED Treatments / Results  Labs (all labs ordered are listed, but only abnormal results are displayed) Labs Reviewed  URINALYSIS, ROUTINE W REFLEX MICROSCOPIC - Abnormal; Notable for the following components:      Result Value   Protein, ur 100 (*)    Leukocytes, UA SMALL (*)    Bacteria, UA RARE (*)    Squamous Epithelial / LPF 0-5 (*)    All other components within normal limits  CBC WITH DIFFERENTIAL/PLATELET - Abnormal; Notable for the following components:   RBC 2.99 (*)    Hemoglobin 10.0 (*)    HCT 31.3 (*)    MCV 104.7 (*)    All other components within normal limits  COMPREHENSIVE METABOLIC PANEL - Abnormal; Notable for the following components:   Glucose, Bld 115 (*)    BUN 33 (*)    Creatinine, Ser 1.76 (*)    Albumin 3.2 (*)    ALT 13 (*)    GFR calc non Af Amer 24 (*)    GFR calc Af Amer 28 (*)    All other components within normal limits  URINE CULTURE  PROTIME-INR  CK  TYPE AND SCREEN    EKG  EKG  Interpretation None       Radiology Dg Tibia/fibula Right  Result Date: 04/25/2017 CLINICAL DATA:  Fall. EXAM: RIGHT TIBIA AND FIBULA - 2 VIEW COMPARISON:  Knee series 04/25/2017 and 10/08/2016 . FINDINGS: No acute soft tissue bony scratched it no acute bony abnormality identified. No evidence of fracture or dislocation. Diffuse soft tissue swelling.  Peripheral vascular calcification. IMPRESSION: Diffuse soft tissue swelling. No acute bony abnormality. No evidence of fracture. Electronically Signed   By: Marcello Moores  Register   On: 04/25/2017 15:18   Ct Knee Right Wo Contrast  Result Date: 04/25/2017 CLINICAL DATA:  Knee fracture EXAM: CT OF THE right KNEE WITHOUT CONTRAST TECHNIQUE: Multidetector CT imaging of the right knee was performed according to the standard protocol. Multiplanar CT image reconstructions were also generated. COMPARISON:  Radiograph 04/25/2017 FINDINGS: Bones/Joint/Cartilage Osteopenia limits the exam. Acute, mildly comminuted lateral and posterior tibial plateau fracture with approximately 4 mm of depression poste. Minimal posterior displacement of fracture fragment. On coronal views, fracture appears to extend into the proximal lateral tibial metaphysis and the interspinous region of the tibia. No other discrete fracture is visualized. Moderate to large like both hemarthrosis. Calcifications within the lateral joint space. Sclerosis in the distal femoral condyles, likely degenerative. Ligaments Suboptimally assessed by CT. Muscles and Tendons Diffuse atrophy of the muscles. Quadriceps and patellar tendons appear. Soft tissues Moderate edema within the lateral soft tissues. Vascular calcifications. IMPRESSION: 1. Osteopenia limits the exam. 2. Acute, comminuted mildly depressed lateral and posterior tibial plateau fracture with minimal displacement. Large like lipohemarthrosis 3. Probable chondrocalcinosis of the lateral joint space. Electronically Signed   By: Donavan Foil M.D.    On: 04/25/2017 19:59   Dg Chest Port 1 View  Result Date: 04/25/2017 CLINICAL DATA:  Right tibial fracture following a fall. EXAM: PORTABLE CHEST 1 VIEW COMPARISON:  11/05/2016.  Chest CT dated 09/14/2014. FINDINGS: Increased prominence of the inferior right hilum with a rounded configuration. Mildly enlarged cardiac silhouette. Clear lungs with mildly prominent interstitial markings and mild diffuse peribronchial thickening. Diffuse osteopenia. Moderate-to-marked left glenohumeral joint degenerative changes. Milder right glenohumeral joint degenerative changes. IMPRESSION: 1. Possible right inferior hilar mass. Followup PA and lateral views are recommended when possible. Alternatively, this could be further evaluated with a chest CT with contrast. 2. Stable mild chronic bronchitic changes. 3. Mild cardiomegaly. Electronically Signed   By: Claudie Revering M.D.   On: 04/25/2017 20:13   Dg Knee Complete 4 Views Right  Result Date: 04/25/2017 CLINICAL DATA:  Lateral knee pain after fall 1 week ago. EXAM: RIGHT KNEE - COMPLETE 4+ VIEW COMPARISON:  Right knee x-rays dated Oct 08, 2016. FINDINGS: Acute, depressed fracture of the lateral tibial plateau. A fracture line seen extending into the proximal tibial metaphysis. Large lipohemarthrosis. Mild tricompartmental degenerative changes and chondrocalcinosis. Diffuse osteopenia. IMPRESSION: Acute, depressed fracture of the lateral tibial plateau. Large lipohemarthrosis. Electronically Signed   By: Titus Dubin M.D.   On: 04/25/2017 15:20    Procedures Procedures (including critical care time)  Medications Ordered in ED Medications - No data to display   Initial Impression / Assessment and Plan / ED Course  I have reviewed the triage vital signs and the nursing notes.  Pertinent labs & imaging results that were available during my care of the patient were reviewed by me and considered in my medical decision making (see chart for details).     Seen  in evaluation by Dr. Inda Merlin orthopedics. She is a paced patient. We'll be admitted to [facility] in the care of the internal medicine admitting team.  Final Clinical Impressions(s) / ED Diagnoses   Final diagnoses:  Closed fracture of right tibial plateau, initial encounter    ED Discharge Orders    None       Tanna Furry, MD 04/26/17 0011

## 2017-04-26 NOTE — Progress Notes (Addendum)
   Subjective: Alexandra Booth was seen laying in her bed this morning.  She states that she is doing well and has some mild amount of pain in her right leg.  She denied any shortness of breath, chest pain.  The patient has taken 1 dose of morphine and 1 dose of Norco this morning.  Objective:  Vital signs in last 24 hours: Vitals:   04/25/17 2330 04/26/17 0045 04/26/17 0532 04/26/17 0858  BP: (!) 189/72 (!) 171/73 (!) 165/70 (!) 159/52  Pulse: 66 74 69 68  Resp: 16  16 20   Temp:  98.8 F (37.1 C) 97.6 F (36.4 C)   TempSrc:  Oral Oral   SpO2: 95% 98% 93% 98%   Physical Exam  Constitutional: She appears well-developed and well-nourished. No distress.  HENT:  Head: Normocephalic and atraumatic.  Eyes: Conjunctivae are normal.  Cardiovascular: Normal rate, regular rhythm and normal heart sounds.  Pulmonary/Chest: Effort normal and breath sounds normal. No stridor. No respiratory distress.  Abdominal: Soft. Bowel sounds are normal. She exhibits no distension. There is no tenderness.  Musculoskeletal: She exhibits tenderness (Mild right leg). She exhibits no edema.  Right knee in a knee immobilizer  Neurological: She is alert.  Skin: She is not diaphoretic.  Psychiatric: She has a normal mood and affect. Her behavior is normal. Judgment and thought content normal.   Assessment/Plan:  Principal Problem:   Tibial plateau fracture Active Problems:   Hypothyroidism   T2DM (type 2 diabetes mellitus) ([place])   Dementia   HYPERTENSION   Fall   Chronic kidney disease (CKD), stage III (moderate) ([facility])   Osteoporosis   GERD (gastroesophageal reflux disease)   Tibial plateau fracture, right   Pulmonary mass  Tibial plateau fracture The patient presented following a fall in her bathroom yesterday 04/25/2017.    -X-ray tibia/fibula 04/26/2017: Diffuse soft tissue swelling, no bony abnormality, no fracture -X-ray knee 04/26/2017: Acute depressed fracture of the lateral tibial plateau, large  lipoma hemi-arthrosis -CT right knee 04/26/2017: Osteopenia, acute comminuted mildly depressed lateral and posterior tibial plateau fracture with minimal  displacement, large lipohemarthrosis.  Multiple chondrocalcinosis of the lateral joint space -Orthopedic surgery placed the patient on nonweightbearing nonoperative therapy at this time and they will reevaluate the patient at 04/27/2017 -PT and OT will follow the patient after orthopedic surgery has evaluated on Sunday, 04/27/2017 -The patient has osteoporosis by definition as patient has had a fracture from from falling from a standing position.  We will start bisphosphonate therapy at discharge -Knee in knee immobilizer -Continue IV morphine 4mg  q3h PRN; home Norco 5-325mg  q6h PRN for pain  Right inferior hilar mass -CT with contrast pending with pre-administration of IV normal saline bolus  Hypertension His blood pressure over the past 24 hours has ranged 162-197/70-82. -Continue losartan 50 mg daily  Dispo: Anticipated discharge in approximately 1-2 day(s).   Lars Mage, MD Internal Medicine PGY1 [phone] 04/26/2017, 12:21 PM

## 2017-04-26 NOTE — Plan of Care (Signed)
  Pain Managment: General experience of comfort will improve 04/26/2017 0929 - Progressing by Dorene Sorrow, RN   Nutrition: Adequate nutrition will be maintained 04/26/2017 0929 - Progressing by Dorene Sorrow, RN

## 2017-04-26 NOTE — Progress Notes (Signed)
OT Cancellation Note  Patient Details Name: Alexandra Booth MRN: [id number] DOB: 1923-10-26   Cancelled Treatment:    Reason Eval/Treat Not Completed: Patient not medically ready. Pt with orders for strict bedrest. Please update activity order, when appropriate, for OT to proceed with eval.     Merri Ray Auria Mckinlay 04/26/2017, 8:33 AM  Hulda Humphrey OTR/L [phone]

## 2017-04-27 LAB — BASIC METABOLIC PANEL
Anion gap: 7 (ref 5–15)
BUN: 34 mg/dL — AB (ref 6–20)
CHLORIDE: 104 mmol/L (ref 101–111)
CO2: 25 mmol/L (ref 22–32)
CREATININE: 1.99 mg/dL — AB (ref 0.44–1.00)
Calcium: 8.3 mg/dL — ABNORMAL LOW (ref 8.9–10.3)
GFR, EST AFRICAN AMERICAN: 24 mL/min — AB (ref >60)
GFR, EST NON AFRICAN AMERICAN: 21 mL/min — AB (ref >60)
Glucose, Bld: 134 mg/dL — ABNORMAL HIGH (ref 65–99)
Potassium: 4.1 mmol/L (ref 3.5–5.1)
SODIUM: 136 mmol/L (ref 135–145)

## 2017-04-27 LAB — CBC
HCT: 27 % — ABNORMAL LOW (ref 36.0–46.0)
HEMOGLOBIN: 8.5 g/dL — AB (ref 12.0–15.0)
MCH: 32.6 pg (ref 26.0–34.0)
MCHC: 31.5 g/dL (ref 30.0–36.0)
MCV: 103.4 fL — ABNORMAL HIGH (ref 78.0–100.0)
PLATELETS: 245 10*3/uL (ref 150–400)
RBC: 2.61 MIL/uL — AB (ref 3.87–5.11)
RDW: 12.3 % (ref 11.5–15.5)
WBC: 7.2 10*3/uL (ref 4.0–10.5)

## 2017-04-27 LAB — GLUCOSE, CAPILLARY
GLUCOSE-CAPILLARY: 132 mg/dL — AB (ref 65–99)
GLUCOSE-CAPILLARY: 158 mg/dL — AB (ref 65–99)
GLUCOSE-CAPILLARY: 198 mg/dL — AB (ref 65–99)
Glucose-Capillary: 121 mg/dL — ABNORMAL HIGH (ref 65–99)

## 2017-04-27 LAB — URINE CULTURE

## 2017-04-27 MED ORDER — HYDRALAZINE HCL 10 MG PO TABS: 10.0000 mg | ORAL_TABLET | Freq: Four times a day (QID) | ORAL | Status: DC | PRN

## 2017-04-27 MED ORDER — SODIUM CHLORIDE 0.9 % IV SOLN
INTRAVENOUS | Status: AC
  Administered 2017-04-27: 18:00:00 via INTRAVENOUS

## 2017-04-27 NOTE — Evaluation (Addendum)
Physical Therapy Evaluation Patient Details Name: Alexandra Booth MRN: [id number] DOB: Apr 12, 1924 Today's Date: 04/27/2017   History of Present Illness  83 year old woman with history of osteoporosis, high risk for falls, fragility fractures, came to ER after a fall in the bathroom.  The patient lives at home by herself. She is a [facility] patient and follows with them regularly. Diagnosed with a right proximal tibial plateau fracture: nonoperative management per ortho, non weightbearing but can do transfers.  Clinical Impression   Pt admitted with above diagnosis. Pt currently with functional limitations due to the deficits listed below (see PT Problem List). Had recently gotten out of her Cam Boot from L lower leg fracture when she sustained this RLE fracture; prior to this admission, Ms. Loder was able to transfer to her wheelchair with minimal assist, an aide helped her with mobility and ADLs in her home; Today, pt performed anterior/posterior transfer bed to recliner well, with very good use of UEs to scoot; Will plan to work on lateral scooting for transfers and try standing/pivot transfers as RLE is able (LLE weak from previous injury, and RLE is NWB); At this point, I believe it is worth initiating conversation for more sustainable longer term living situation; Pt will benefit from skilled PT to increase their independence and safety with mobility to allow discharge to the venue listed below.       Follow Up Recommendations SNF    Equipment Recommendations  Other (comment)(will defer to SNF)    Recommendations for Other Services       Precautions / Restrictions Precautions Precautions: Fall Required Braces or Orthoses: Knee Immobilizer - Right Knee Immobilizer - Right: On at all times Restrictions RLE Weight Bearing: Non weight bearing      Mobility  Bed Mobility Overal bed mobility: Needs Assistance Bed Mobility: Supine to Sit     Supine to sit: Min assist     General  bed mobility comments: HOB slightly elevated; Pt able to use bedrails to pull self to long sitting, min assist to help angle LEs  Transfers Overall transfer level: Needs assistance Equipment used: 2 person hand held assist Transfers: Comptroller transfers: +2 physical assistance;Mod assist   General transfer comment: light mod assist to transfer to chair; 2 people for safety; excellent use of UEs on rails and beside trunk in bed to scoot posteriorly to chair  Ambulation/Gait                Stairs            Wheelchair Mobility    Modified Rankin (Stroke Patients Only)       Balance                                             Pertinent Vitals/Pain Pain Assessment: 0-10 Pain Score: 5  Pain Location: R knee Pain Descriptors / Indicators: Grimacing;Discomfort;Sore Pain Intervention(s): Limited activity within patient's tolerance    Home Living Family/patient expects to be discharged to:: Skilled nursing facility                      Prior Function Level of Independence: Needs assistance   Gait / Transfers Assistance Needed: wc mobility recently per pt but could stand with assistance to transfer to chair  ADL's / Homemaking Assistance Needed: aide assisting  with bathing and dressing  Comments: active member of [facility]     Hand Dominance   Dominant Hand: Right    Extremity/Trunk Assessment   Upper Extremity Assessment Upper Extremity Assessment: Defer to OT evaluation(impressive use of arms for scooting)    Lower Extremity Assessment Lower Extremity Assessment: RLE deficits/detail;LLE deficits/detail RLE Deficits / Details: in knee immobilizer; Able to move hip freely with gravity eliminated RLE: Unable to fully assess due to pain;Unable to fully assess due to immobilization RLE Coordination: decreased fine motor;decreased gross motor LLE Deficits / Details: Generalized weakness;  recent fracture and IM nail in July- just got out of CAM boot LLE Coordination: decreased fine motor;decreased gross motor       Communication   Communication: No difficulties  Cognition Arousal/Alertness: Awake/alert Behavior During Therapy: WFL for tasks assessed/performed Overall Cognitive Status: Within Functional Limits for tasks assessed                                        General Comments      Exercises     Assessment/Plan    PT Assessment Patient needs continued PT services  PT Problem List Decreased strength;Decreased range of motion;Decreased activity tolerance;Decreased balance;Decreased mobility;Decreased knowledge of use of DME;Decreased knowledge of precautions;Pain       PT Treatment Interventions DME instruction;Functional mobility training;Therapeutic activities;Therapeutic exercise;Balance training;Patient/family education;Wheelchair mobility training    PT Goals (Current goals can be found in the Care Plan section)  Acute Rehab PT Goals Patient Stated Goal: Did not specifically state, but agreeable to getting OOB PT Goal Formulation: With patient Time For Goal Achievement: 05/11/17 Potential to Achieve Goals: Good    Frequency Min 2X/week   Barriers to discharge Decreased caregiver support      Co-evaluation PT/OT/SLP Co-Evaluation/Treatment: Yes Reason for Co-Treatment: Necessary to address cognition/behavior during functional activity;For patient/therapist safety;To address functional/ADL transfers PT goals addressed during session: Mobility/safety with mobility         AM-PAC PT "6 Clicks" Daily Activity  Outcome Measure Difficulty turning over in bed (including adjusting bedclothes, sheets and blankets)?: Unable Difficulty moving from lying on back to sitting on the side of the bed? : A Lot Difficulty sitting down on and standing up from a chair with arms (e.g., wheelchair, bedside commode, etc,.)?: Unable Help needed  moving to and from a bed to chair (including a wheelchair)?: A Lot Help needed walking in hospital room?: Total Help needed climbing 3-5 steps with a railing? : Total 6 Click Score: 8    End of Session Equipment Utilized During Treatment: (bed pad) Activity Tolerance: Patient tolerated treatment well Patient left: in chair;with call bell/phone within reach;with chair alarm set(bilateral LEs on bed with recliner locked facing the bed) Nurse Communication: Mobility status PT Visit Diagnosis: Other abnormalities of gait and mobility (R26.89);Pain;Muscle weakness (generalized) (M62.81) Pain - Right/Left: Right Pain - part of body: Knee    Time: 0865-7846 PT Time Calculation (min) (ACUTE ONLY): 39 min   Charges:   PT Evaluation $PT Eval Moderate Complexity: 1 Mod     PT G Codes:        Roney Marion, PT  Acute Rehabilitation Services Pager [phone] Office [phone]   [place] 04/27/2017, 5:30 PM

## 2017-04-27 NOTE — Progress Notes (Addendum)
   Subjective:   No acute events overnight. Pt was very sleepy in the morning and exam limited by that. Denies any pain, or SOB. Incentive spirometer at bedside which she said she was using. Took 3 doses of norco yesterday   Objective:  Vital signs in last 24 hours: Vitals:   04/26/17 0532 04/26/17 0858 04/26/17 1543 04/26/17 2000  BP: (!) 165/70 (!) 159/52 (!) 155/55 (!) 154/54  Pulse: 69 68 69 72  Resp: 16 20 18 18   Temp: 97.6 F (36.4 C)  97.6 F (36.4 C) 98.3 F (36.8 C)  TempSrc: Oral  Oral Oral  SpO2: 93% 98% 97% 100%   General: Vital signs reviewed. Patient in no acute distress, sleepy when examined Cardiovascular: regular rate, rhythm, no murmur appreciated  Pulmonary/Chest: Clear to auscultation bilaterally, no wheezes, rales, or rhonchi. Abdominal: Soft, non-tender, non-distended, BS + Extremities  Right knee in a knee immobilizer    Assessment/Plan:  Principal Problem:   Tibial plateau fracture Active Problems:   Hypothyroidism   T2DM (type 2 diabetes mellitus) ([place])   Dementia   HYPERTENSION   Fall   Chronic kidney disease (CKD), stage III (moderate) (HCC)   Osteoporosis   GERD (gastroesophageal reflux disease)   Tibial plateau fracture, right   Pulmonary mass   Right tibial plateau fracture: nonoperative management per ortho, non weightbearing but can do transfers.  Awaiting PT recommendations. Continue norco for pain as needed. She has not required them since about 10 PM yesterday.  -continue norco PRN -awaiting PT recs -incentive spirometer  Fragility fracture- the above is considered an osteoporotic fragility fracture  -bisphosphonate, calcium and vitamin D on discharge  Right inferior hilar mass -CT with contrast pending with pre-administration of IV normal saline bolus -BMET showed slight worsening of renal function so held the ct contrast till itimproves -given normal saline fluid   Hypertension blood pressure over the past 24 hours has  been in the 150s  -stopped losartan today as repeat bmet showed slight worsening of renal function -prn hydralazine for sbp >150  Dispo: Anticipated discharge in approximately 1-2 day(s).   Burgess Estelle , MD MPH IMTS 04/27/2017, 8:24 AM

## 2017-04-27 NOTE — Consult Note (Signed)
Reason for Consult:fall with right lateral tibial plateau fracture , closed.  Referring Physician: Evette Doffing MD  Alexandra Booth is an 83 y.o. female.  HPI: 83 year old female fell at home she is [facility] by [facility] with fall date on 04/25/2017 while in the bathroom.  Pain with attempted transfers and x-rays demonstrated a lateral tibial plateau fracture.  CT scan showed slight 4 mm posterior depression of the lateral tibial plateau with osteopenia.  Hemarthrosis was demonstrated.  Recent treatment with intramedullary tibial nail for distal tibial shaft fracture treated by Dr.Xu with intramedullary locking nail on 12/19/2016 which is healing.  Past Medical History:  Diagnosis Date  . Depression with anxiety   . GERD (gastroesophageal reflux disease)   . Hypothyroidism     Past Surgical History:  Procedure Laterality Date  . INTRAMEDULLARY (IM) NAIL FEMORAL Left 09/12/2014   Performed by Leandrew Koyanagi, MD at [facility]  . INTRAMEDULLARY (IM) NAIL TIBIAL Left 12/19/2016   Performed by Leandrew Koyanagi, MD at [facility]      Family History  Problem Relation Age of Onset  . Kidney disease Mother   . Kidney disease Father     Social History:  reports that she has quit smoking. she has never used smokeless tobacco. She reports that she does not drink alcohol or use drugs.  Allergies:  Allergies  Allergen Reactions  . Diazepam     REACTION: Unknown reaction  . Penicillins     REACTION: rash, itching  . Sulfonamide Derivatives     REACTION: GI upset    Medications: I have reviewed the patient's current medications.  Results for orders placed or performed during the hospital encounter of 04/25/17 (from the past 48 hour(s))  Type and screen     Status: None   Collection Time: 04/25/17  8:04 PM  Result Value Ref Range   ABO/RH(D) O POS    Antibody Screen POS    Sample Expiration 04/28/2017    Antibody Identification ANTI C   Urinalysis, Routine w reflex microscopic     Status:  Abnormal   Collection Time: 04/25/17  9:14 PM  Result Value Ref Range   Color, Urine YELLOW YELLOW   APPearance CLEAR CLEAR   Specific Gravity, Urine 1.023 1.005 - 1.030   pH 6.0 5.0 - 8.0   Glucose, UA NEGATIVE NEGATIVE mg/dL   Hgb urine dipstick NEGATIVE NEGATIVE   Bilirubin Urine NEGATIVE NEGATIVE   Ketones, ur NEGATIVE NEGATIVE mg/dL   Protein, ur 100 (A) NEGATIVE mg/dL   Nitrite NEGATIVE NEGATIVE   Leukocytes, UA SMALL (A) NEGATIVE   RBC / HPF 0-5 0 - 5 RBC/hpf   WBC, UA 0-5 0 - 5 WBC/hpf   Bacteria, UA RARE (A) NONE SEEN   Squamous Epithelial / LPF 0-5 (A) NONE SEEN  Urine Culture     Status: Abnormal   Collection Time: 04/25/17  9:14 PM  Result Value Ref Range   Specimen Description URINE, CLEAN CATCH    Special Requests NONE    Culture MULTIPLE SPECIES PRESENT, SUGGEST RECOLLECTION (A)    Report Status 04/27/2017 FINAL   Glucose, capillary     Status: Abnormal   Collection Time: 04/26/17  7:01 AM  Result Value Ref Range   Glucose-Capillary 105 (H) 65 - 99 mg/dL  Glucose, capillary     Status: Abnormal   Collection Time: 04/26/17 12:32 PM  Result Value Ref Range   Glucose-Capillary 136 (H) 65 - 99 mg/dL  Glucose, capillary     Status: Abnormal   Collection Time: 04/26/17  4:03 PM  Result Value Ref Range   Glucose-Capillary 141 (H) 65 - 99 mg/dL  Glucose, capillary     Status: Abnormal   Collection Time: 04/26/17  9:23 PM  Result Value Ref Range   Glucose-Capillary 139 (H) 65 - 99 mg/dL  Glucose, capillary     Status: Abnormal   Collection Time: 04/27/17  6:25 AM  Result Value Ref Range   Glucose-Capillary 132 (H) 65 - 99 mg/dL  CBC     Status: Abnormal   Collection Time: 04/27/17  7:42 AM  Result Value Ref Range   WBC 7.2 4.0 - 10.5 K/uL   RBC 2.61 (L) 3.87 - 5.11 MIL/uL   Hemoglobin 8.5 (L) 12.0 - 15.0 g/dL   HCT 27.0 (L) 36.0 - 46.0 %   MCV 103.4 (H) 78.0 - 100.0 fL   MCH 32.6 26.0 - 34.0 pg   MCHC 31.5 30.0 - 36.0 g/dL   RDW 12.3 11.5 - 15.5 %    Platelets 245 150 - 400 K/uL  Basic metabolic panel     Status: Abnormal   Collection Time: 04/27/17  7:42 AM  Result Value Ref Range   Sodium 136 135 - 145 mmol/L   Potassium 4.1 3.5 - 5.1 mmol/L   Chloride 104 101 - 111 mmol/L   CO2 25 22 - 32 mmol/L   Glucose, Bld 134 (H) 65 - 99 mg/dL   BUN 34 (H) 6 - 20 mg/dL   Creatinine, Ser 1.99 (H) 0.44 - 1.00 mg/dL   Calcium 8.3 (L) 8.9 - 10.3 mg/dL   GFR calc non Af Amer 21 (L) >60 mL/min   GFR calc Af Amer 24 (L) >60 mL/min    Comment: (NOTE) The eGFR has been calculated using the CKD EPI equation. This calculation has not been validated in all clinical situations. eGFR's persistently <60 mL/min signify possible Chronic Kidney Disease.    Anion gap 7 5 - 15  Glucose, capillary     Status: Abnormal   Collection Time: 04/27/17 12:31 PM  Result Value Ref Range   Glucose-Capillary 158 (H) 65 - 99 mg/dL  Glucose, capillary     Status: Abnormal   Collection Time: 04/27/17  4:03 PM  Result Value Ref Range   Glucose-Capillary 198 (H) 65 - 99 mg/dL    No results found.  ROS 14 point review of systems positive for depression with anxiety, GERD, hypothyroidism.  Coronary artery disease, lumbar disc degeneration, dementia, dysphasia.  Tibial nail by Dr.Xu in July, history of UTI, urinary retention, osteoporosis, lumbar radiculopathy otherwise negative as it pertains HPI . Blood pressure (!) 147/46, pulse 70, temperature 97.6 F (36.4 C), temperature source Oral, resp. rate 18, SpO2 96 %. Physical Exam  Constitutional: She is oriented to person, place, and time. She appears well-developed and well-nourished.  HENT:  Head: Normocephalic.  Eyes: Pupils are equal, round, and reactive to light.  Neck: Normal range of motion.  Cardiovascular: Normal rate and normal heart sounds.  Respiratory: Effort normal and breath sounds normal. She has no wheezes.  GI: Soft. Bowel sounds are normal. She exhibits no distension. There is no tenderness.    Musculoskeletal: She exhibits tenderness. She exhibits no edema.  Neurological: She is alert and oriented to person, place, and time.  Skin: Skin is warm and dry.  Psychiatric: She has a normal mood and affect. Her behavior is normal.  Right knee anterior, lateral  and posterior compartments are soft.  Distal pulses palpable.  Minimal swelling in the anterior compartment.  There is hemarthrosis present not tense.  This is a closed injury of the right tibial plateau with intact skin.  Assessment/Plan: Patient will be nonweightbearing on the right lower extremity for 6 weeks.  Immobilization with right  knee immobilizer.  She can place her left t foot down for pivot transfers to recliner.  Follow-up for repeat x-rays in 2 weeks.  My cell [phone]  Marybelle Killings 04/27/2017, 7:53 PM

## 2017-04-27 NOTE — Evaluation (Signed)
Occupational Therapy Evaluation Patient Details Name: Alexandra Booth MRN: [id number] DOB: 05-Oct-1923 Today's Date: 04/27/2017    History of Present Illness 83 year old woman with history of osteoporosis, high risk for falls, fragility fractures, came to ER after a fall in the bathroom.  The patient lives at home by herself. She is a [facility] patient and follows with them regularly. Diagnosed with a right proximal tibial plateau fracture: nonoperative management per ortho, non weightbearing but can do transfers.   Clinical Impression   Pt admitted with above diagnosis. Pt currently with functional limitations due to the deficits listed below (see OT Problem List). Had recently gotten out of her Cam Boot from L lower leg fracture when she sustained this RLE fracture; prior to this admission, Ms. Ouellet was able to transfer to her wheelchair with minimal assist, an aide helped her with mobility and ADLs in her home. Pt is currently set up for UB ADL and total A for LB ADL. Mod +2 for bed mobility (AP transfer to recliner with very good use of UEs to scoot; Will plan to work on lateral scooting for transfers and try standing/pivot transfers as LLE is able (LLE weak from previous injury, and RLE is NWB); Pt will benefit from skilled OT in the acute setting and afterwards at SNF level to maximize safety and independence in ADL and functional transfers. Pt is an active member of [facility], but therapy team in agreement that longer term solutions where Pt would have more assistance available might be the best option as she has no family able to assist (daughter is in SNF, husband/son diseased.    Follow Up Recommendations  SNF    Equipment Recommendations  Other (comment)(defer to next venue)    Recommendations for Other Services       Precautions / Restrictions Precautions Precautions: Fall Required Braces or Orthoses: Knee Immobilizer - Right Knee Immobilizer - Right: On at all  times Restrictions Weight Bearing Restrictions: Yes RLE Weight Bearing: Non weight bearing      Mobility Bed Mobility Overal bed mobility: Needs Assistance Bed Mobility: Supine to Sit     Supine to sit: Min assist     General bed mobility comments: HOB slightly elevated; Pt able to use bedrails to pull self to long sitting, min assist to help angle LEs  Transfers Overall transfer level: Needs assistance Equipment used: 2 person hand held assist Transfers: Comptroller transfers: +2 physical assistance;Mod assist   General transfer comment: light mod assist to transfer to chair; 2 people for safety; excellent use of UEs on rails and beside trunk in bed to scoot posteriorly to chair    Balance Overall balance assessment: Needs assistance Sitting-balance support: Bilateral upper extremity supported;Feet supported Sitting balance-Leahy Scale: Fair                                     ADL either performed or assessed with clinical judgement   ADL Overall ADL's : Needs assistance/impaired Eating/Feeding: Modified independent   Grooming: Set up;Sitting;Wash/dry hands;Wash/dry face   Upper Body Bathing: Moderate assistance Upper Body Bathing Details (indicate cue type and reason): mod A only for back Lower Body Bathing: Maximal assistance;Bed level   Upper Body Dressing : Moderate assistance   Lower Body Dressing: Total assistance;+2 for physical assistance   Toilet Transfer: Moderate assistance;+2 for physical assistance;Anterior/posterior;[employer] Armed forces technical officer Details (indicate cue type  and reason): Pt with good arm strength to perform AP transfer Toileting- Clothing Manipulation and Hygiene: Total assistance       Functional mobility during ADLs: (not safe at this time)       Vision Baseline Vision/History: Wears glasses Wears Glasses: At all times Patient Visual Report: No change from baseline        Perception     Praxis      Pertinent Vitals/Pain Pain Assessment: 0-10 Pain Score: 5  Pain Location: R knee Pain Descriptors / Indicators: Grimacing;Discomfort;Sore Pain Intervention(s): Limited activity within patient's tolerance     Hand Dominance Right   Extremity/Trunk Assessment Upper Extremity Assessment Upper Extremity Assessment: Overall WFL for tasks assessed(impressive use of arms for scooting)   Lower Extremity Assessment Lower Extremity Assessment: RLE deficits/detail;LLE deficits/detail RLE Deficits / Details: in knee immobilizer; Able to move hip freely with gravity eliminated RLE: Unable to fully assess due to pain;Unable to fully assess due to immobilization RLE Coordination: decreased fine motor;decreased gross motor LLE Deficits / Details: recent fracture and IM nail in July- just got out of CAM boot LLE Coordination: decreased fine motor;decreased gross motor       Communication Communication Communication: No difficulties   Cognition Arousal/Alertness: Awake/alert Behavior During Therapy: WFL for tasks assessed/performed Overall Cognitive Status: Within Functional Limits for tasks assessed                                     General Comments  Pt is very pleasant and motivated to work with therapy    Exercises     Shoulder Instructions      Home Living Family/patient expects to be discharged to:: Skilled nursing facility                                        Prior Functioning/Environment Level of Independence: Needs assistance  Gait / Transfers Assistance Needed: wc mobility recently per pt but could stand with assistance to transfer to chair ADL's / Homemaking Assistance Needed: aide assisting with bathing and dressing   Comments: active member of [facility]        OT Problem List: Decreased strength;Decreased range of motion;Decreased activity tolerance;Impaired balance (sitting and/or standing);Decreased  safety awareness;Decreased knowledge of use of DME or AE;Decreased knowledge of precautions;Obesity;Pain      OT Treatment/Interventions: Self-care/ADL training;Therapeutic exercise;Energy conservation;DME and/or AE instruction;Therapeutic activities;Patient/family education;Balance training    OT Goals(Current goals can be found in the care plan section) Acute Rehab OT Goals Patient Stated Goal: Did not specifically state, but agreeable to getting OOB OT Goal Formulation: With patient Time For Goal Achievement: 05/11/17 Potential to Achieve Goals: Fair ADL Goals Pt Will Perform Grooming: with set-up;sitting Pt Will Transfer to Toilet: with min guard assist;bedside commode Pt Will Perform Toileting - Clothing Manipulation and hygiene: with mod assist;with caregiver independent in assisting;sitting/lateral leans Pt/caregiver will Perform Home Exercise Program: Both right and left upper extremity;With theraband;Independently;With written HEP provided;Increased strength Additional ADL Goal #1: Pt will perform bed mobility at supervision level prior to engaging in ADL activity  OT Frequency: Min 2X/week   Barriers to D/C: Decreased caregiver support  Pt lives alone       Co-evaluation PT/OT/SLP Co-Evaluation/Treatment: Yes Reason for Co-Treatment: Necessary to address cognition/behavior during functional activity;For patient/therapist safety;To address functional/ADL transfers PT goals addressed during  session: Mobility/safety with mobility OT goals addressed during session: ADL's and self-care      AM-PAC PT "6 Clicks" Daily Activity     Outcome Measure Help from another person eating meals?: None Help from another person taking care of personal grooming?: A Little Help from another person toileting, which includes using toliet, bedpan, or urinal?: A Lot Help from another person bathing (including washing, rinsing, drying)?: A Lot Help from another person to put on and taking off  regular upper body clothing?: A Little Help from another person to put on and taking off regular lower body clothing?: Total 6 Click Score: 15   End of Session Nurse Communication: Mobility status;Other (comment)(purewick out, Pt would like razor for shaving)  Activity Tolerance: Patient tolerated treatment well Patient left: in chair;with call bell/phone within reach;with chair alarm set  OT Visit Diagnosis: Other abnormalities of gait and mobility (R26.89);Repeated falls (R29.6);History of falling (Z91.81);Muscle weakness (generalized) (M62.81);Pain Pain - Right/Left: Right Pain - part of body: Knee                Time: 7510-2585 OT Time Calculation (min): 42 min Charges:  OT General Charges $OT Visit: 1 Visit OT Evaluation $OT Eval Moderate Complexity: 1 Mod G-Codes:     Hulda Humphrey OTR/L [place] Baelyn Doring 04/27/2017, 5:56 PM

## 2017-04-28 DIAGNOSIS — E039 Hypothyroidism, unspecified: Secondary | ICD-10-CM

## 2017-04-28 DIAGNOSIS — N183 Chronic kidney disease, stage 3 (moderate): Secondary | ICD-10-CM

## 2017-04-28 DIAGNOSIS — R918 Other nonspecific abnormal finding of lung field: Secondary | ICD-10-CM

## 2017-04-28 DIAGNOSIS — Y92002 Bathroom of unspecified non-institutional (private) residence single-family (private) house as the place of occurrence of the external cause: Secondary | ICD-10-CM

## 2017-04-28 DIAGNOSIS — M8580 Other specified disorders of bone density and structure, unspecified site: Secondary | ICD-10-CM

## 2017-04-28 DIAGNOSIS — D539 Nutritional anemia, unspecified: Secondary | ICD-10-CM

## 2017-04-28 DIAGNOSIS — W1830XA Fall on same level, unspecified, initial encounter: Secondary | ICD-10-CM

## 2017-04-28 DIAGNOSIS — E1122 Type 2 diabetes mellitus with diabetic chronic kidney disease: Secondary | ICD-10-CM

## 2017-04-28 DIAGNOSIS — K219 Gastro-esophageal reflux disease without esophagitis: Secondary | ICD-10-CM

## 2017-04-28 DIAGNOSIS — S82141A Displaced bicondylar fracture of right tibia, initial encounter for closed fracture: Principal | ICD-10-CM

## 2017-04-28 DIAGNOSIS — F0390 Unspecified dementia without behavioral disturbance: Secondary | ICD-10-CM

## 2017-04-28 DIAGNOSIS — I1310 Hypertensive heart and chronic kidney disease without heart failure, with stage 1 through stage 4 chronic kidney disease, or unspecified chronic kidney disease: Secondary | ICD-10-CM

## 2017-04-28 LAB — BASIC METABOLIC PANEL
Anion gap: 8 (ref 5–15)
BUN: 35 mg/dL — ABNORMAL HIGH (ref 6–20)
CHLORIDE: 105 mmol/L (ref 101–111)
CO2: 23 mmol/L (ref 22–32)
CREATININE: 1.92 mg/dL — AB (ref 0.44–1.00)
Calcium: 8.5 mg/dL — ABNORMAL LOW (ref 8.9–10.3)
GFR calc non Af Amer: 22 mL/min — ABNORMAL LOW (ref >60)
GFR, EST AFRICAN AMERICAN: 25 mL/min — AB (ref >60)
GLUCOSE: 118 mg/dL — AB (ref 65–99)
Potassium: 4.3 mmol/L (ref 3.5–5.1)
Sodium: 136 mmol/L (ref 135–145)

## 2017-04-28 LAB — CBC
HEMATOCRIT: 27.5 % — AB (ref 36.0–46.0)
HEMOGLOBIN: 8.6 g/dL — AB (ref 12.0–15.0)
MCH: 31.9 pg (ref 26.0–34.0)
MCHC: 31.3 g/dL (ref 30.0–36.0)
MCV: 101.9 fL — AB (ref 78.0–100.0)
Platelets: 223 10*3/uL (ref 150–400)
RBC: 2.7 MIL/uL — AB (ref 3.87–5.11)
RDW: 12.2 % (ref 11.5–15.5)
WBC: 7.6 10*3/uL (ref 4.0–10.5)

## 2017-04-28 LAB — GLUCOSE, CAPILLARY
GLUCOSE-CAPILLARY: 134 mg/dL — AB (ref 65–99)
GLUCOSE-CAPILLARY: 147 mg/dL — AB (ref 65–99)
Glucose-Capillary: 122 mg/dL — ABNORMAL HIGH (ref 65–99)

## 2017-04-28 NOTE — Social Work (Signed)
Clinical Social Worker facilitated patient discharge including contacting patient family and facility to confirm patient discharge plans.  Clinical information faxed to facility and family agreeable with plan.    SW arranged ambulance transport via [facility] to [facility].    RN to call [phone] to give report prior to discharge.  Clinical Social Worker will sign off for now as social work intervention is no longer needed. Please consult [place] again if new need arises.  Elissa Hefty, LCSW Clinical Social Worker [phone]

## 2017-04-28 NOTE — NC FL2 (Signed)
[place] LEVEL OF CARE SCREENING TOOL     IDENTIFICATION  Patient Name: Alexandra Booth Birthdate: [birth year] Sex: female Admission Date (Current Location): 04/25/2017  [facility] and [place] Number:  Herbalist and Address:         Provider Number: [id number]  Attending Physician Name and Address:  Axel Filler, *  Relative Name and Phone Number:  Glade Nurse, granddaughter, [phone]    Current Level of Care: Hospital Recommended Level of Care: [place] Prior Approval Number:    Date Approved/Denied:   PASRR Number: [id number]  Discharge Plan: SNF    Current Diagnoses: Patient Active Problem List   Diagnosis Date Noted  . Tibial plateau fracture, right 04/25/2017  . Pulmonary mass 04/25/2017  . Tibial plateau fracture 04/25/2017  . Fracture of tibial shaft, left, closed 12/18/2016  . Fracture of left proximal fibula 12/18/2016  . GERD (gastroesophageal reflux disease)   . Depression with anxiety   . Fever 09/14/2014  . Compression fracture of body of thoracic vertebra ([place]) 09/14/2014  . Osteoporosis 09/14/2014  . Irregular heart beats 09/14/2014  . Abnormal chest x-ray   . Acute blood loss anemia   . Macrocytic anemia   . Obesity 09/12/2014  . Fall 09/11/2014  . UTI (lower urinary tract infection) 09/11/2014  . Chronic kidney disease (CKD), stage III (moderate) ([place]) 09/11/2014  . Fracture, proximal femur ([place]) 09/11/2014  . Femur fracture, left ([place]) 09/11/2014  . Opacity of lung on imaging study 09/11/2014  . URINARY RETENTION 10/17/2008  . HYPERTENSION 09/07/2008  . HYPOKALEMIA 07/21/2008  . Dementia 07/21/2008  . KNEE PAIN, BILATERAL 05/17/2008  . ACCIDENTAL FALL, HX OF 05/17/2008  . ABDOMINAL PAIN OTHER SPECIFIED SITE 02/05/2008  . DYSPHAGIA UNSPECIFIED 01/13/2008  . Anemia 09/21/2007  . THYROIDECTOMY, HX OF 09/03/2007  . LAMINECTOMY, LUMBAR, HX OF 09/03/2007  . INTERTRIGO, CANDIDAL  06/24/2007  . CARCINOID TUMOR 05/01/2007  . CHEST PAIN 05/01/2007  . HEMORRHOIDS, EXTERNAL 12/23/2006  . 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 Hypothyroidism 04/28/2006  . T2DM (type 2 diabetes mellitus) ([place]) 04/28/2006  . HYPERLIPIDEMIA 04/28/2006  . DEPRESSION 04/28/2006  . Coronary atherosclerosis 04/28/2006  . [place] DISEASE, LUMBAR SPINE 04/28/2006  . LUMBAR RADICULOPATHY 04/28/2006    Orientation RESPIRATION BLADDER Height & Weight        Normal Continent, Indwelling catheter Weight:   Height:     BEHAVIORAL SYMPTOMS/MOOD NEUROLOGICAL BOWEL NUTRITION STATUS      Continent Diet(see Discharge Summary)  AMBULATORY STATUS COMMUNICATION OF NEEDS Skin   Extensive Assist Verbally Bruising(Nonoperative Tib Plateau Fracture)                       Personal Care Assistance Level of Assistance  Dressing, Bathing, Feeding Bathing Assistance: Limited assistance Feeding assistance: Limited assistance Dressing Assistance: Maximum assistance     Functional Limitations Info  Sight, Hearing, Speech Sight Info: Adequate Hearing Info: Adequate Speech Info: Adequate    SPECIAL CARE FACTORS FREQUENCY  PT (By licensed PT), OT (By licensed OT)     PT Frequency: 2x week OT Frequency: 2x week            Contractures Contractures Info: Not present    Additional Factors Info  Code Status, Allergies, Psychotropic, Insulin Sliding Scale Code Status Info: DNR   Psychotropic Info: Lexapro, Namenda, Xanax Insulin Sliding Scale Info: Daily with meals       Current Medications (04/28/2017):  This  is the current hospital active medication list Current Facility-Administered Medications  Medication Dose Route Frequency Provider Last Rate Last Dose  . acetaminophen (TYLENOL) tablet 650 mg  650 mg Oral Q6H PRN Shela Leff, MD       Or  . acetaminophen (TYLENOL) suppository 650 mg  650 mg Rectal Q6H PRN Shela Leff, MD      . ALPRAZolam  Duanne Moron) tablet 0.5 mg  0.5 mg Oral QHS Shela Leff, MD   0.5 mg at 04/27/17 2351  . cholecalciferol (VITAMIN D) tablet 1,000 Units  1,000 Units Oral Daily Shela Leff, MD   1,000 Units at 04/28/17 1006  . cyanocobalamin tablet 500 mcg  500 mcg Oral Daily Shela Leff, MD   500 mcg at 04/28/17 1007  . darifenacin (ENABLEX) 24 hr tablet 7.5 mg  7.5 mg Oral Daily Shela Leff, MD   7.5 mg at 04/28/17 1007  . donepezil (ARICEPT) tablet 10 mg  10 mg Oral QHS Shela Leff, MD   10 mg at 04/27/17 2351  . enoxaparin (LOVENOX) injection 30 mg  30 mg Subcutaneous Q24H Axel Filler, MD   30 mg at 04/28/17 1009  . escitalopram (LEXAPRO) tablet 10 mg  10 mg Oral Daily Shela Leff, MD   10 mg at 04/28/17 1006  . guaiFENesin-dextromethorphan (ROBITUSSIN DM) 100-10 MG/5ML syrup 5 mL  5 mL Oral Q4H PRN Axel Filler, MD   5 mL at 04/27/17 2355  . hydrALAZINE (APRESOLINE) tablet 10 mg  10 mg Oral Q6H PRN Burgess Estelle, MD      . HYDROcodone-acetaminophen (NORCO/VICODIN) 5-325 MG per tablet 1-2 tablet  1-2 tablet Oral Q6H PRN Shela Leff, MD   2 tablet at 04/27/17 1817  . insulin aspart (novoLOG) injection 0-9 Units  0-9 Units Subcutaneous TID WC Shela Leff, MD   1 Units at 04/28/17 0817  . iron polysaccharides (NIFEREX) capsule 150 mg  150 mg Oral BID Shela Leff, MD   150 mg at 04/28/17 1007  . levothyroxine (SYNTHROID, LEVOTHROID) tablet 25 mcg  25 mcg Oral QAC breakfast Shela Leff, MD   25 mcg at 04/28/17 0816  . losartan (COZAAR) tablet 50 mg  50 mg Oral Daily Shela Leff, MD   50 mg at 04/28/17 1007  . Melatonin TABS 3 mg  1 tablet Oral QHS Shela Leff, MD   3 mg at 04/27/17 2352  . memantine (NAMENDA) tablet 10 mg  10 mg Oral BID Shela Leff, MD   10 mg at 04/28/17 1006  . morphine 4 MG/ML injection 4 mg  4 mg Intravenous Q3H PRN Colbert Ewing, MD   4 mg at 04/27/17 2352  . multivitamin with minerals  tablet 1 tablet  1 tablet Oral Daily Shela Leff, MD   1 tablet at 04/28/17 1007  . pantoprazole (PROTONIX) EC tablet 40 mg  40 mg Oral Daily Shela Leff, MD   40 mg at 04/28/17 1007  . polyethylene glycol (MIRALAX / GLYCOLAX) packet 17 g  17 g Oral Daily PRN Shela Leff, MD      . polyvinyl alcohol (LIQUIFILM TEARS) 1.4 % ophthalmic solution 1 drop  1 drop Both Eyes BID PRN Janett Billow, RPH      . senna (SENOKOT) tablet 8.6 mg  1 tablet Oral BID Shela Leff, MD   8.6 mg at 04/28/17 1007     Discharge Medications: Please see discharge summary for a list of discharge medications.  Relevant Imaging Results:  Relevant Lab Results:   Additional  Information SS#:[id number] Hudson, LCSW

## 2017-04-28 NOTE — Social Work (Signed)
CSW f/u with [facility] on patient SNF placement. CSW let message for social worker to discuss  Pt indicated that she does not want to go to [facility] or [facility]. CSW discussed possible [facility]   CSW tried to discuss with granddaughter however phone number on file is not working. CSW will advise [facility] of same to assist.  SNF's-[facility], [facility] and [facility] have offered a bed.  CSW will continue to follow up.  Elissa Hefty, LCSW Clinical Social Worker [phone]

## 2017-04-28 NOTE — Discharge Instructions (Signed)
It was a pleasure to take care of you Alexandra Booth. During this admission you were taken care of for a right tibial fracture. Please follow with your primary care physician. Thank you!   Nondisplaced Tibial Plateau Fracture A tibial plateau fracture is a break in the bone that forms the bottom of your knee joint (tibia or shin bone). The lower end of your thigh bone (femur) forms the upper surface of your knee joint. The top of the tibia has a flat, smooth surface (tibial plateau). This part of the shin bone is made of softer bone than the shaft of the shin bone. If a strong force shoves your femur down into your tibial plateau, the tibial plateau can collapse or break away at the edges. This is also called an intra-articular fracture. A nondisplaced fracture means that the broken piece or pieces of your tibial plateau have not moved out of their normal position. This type of fracture can usually be treated without surgery. You will need to wear a brace or a splint and avoid using your knee to support your body weight while the fracture is healing. What are the causes? Common causes of this type of fracture include:  Car accidents.  Jumps or falls from a significant height.  Injuries from high-energy sports or contact sports.  What increases the risk? You may be at higher risk for this type of fracture if:  You play high-energy sports or contact sports.  You have a history of bone infections.  You are an older person with a condition that causes weak bones (osteoporosis).  What are the signs or symptoms? Signs and symptoms of a nondisplaced tibial plateau fracture begin immediately after the injury. They may include:  Pain that is worse when you use your knee to support your body weight.  Swelling of your knee.  Bruising around your knee.  How is this diagnosed? Your health care provider may suspect a nondisplaced tibial plateau fracture based on your signs and symptoms, especially  if you had a recent injury. Your health care provider will also do a physical exam. This may include imaging tests such as:  X-ray of your knee. This is used to confirm the diagnosis.  CT scan or MRI. This checks to make sure that no bones have moved out of place and that there are no other injuries to your knee.  How is this treated? Treatment for a nondisplaced tibial plateau fracture may involve wearing a brace or a splint to keep your leg in one position while it heals. You may also need to use crutches, a scooter, a walker, or a wheelchair so you can move around without using your injured leg to support your body weight. You may also be prescribed pain medicine. While your knee is healing, you may see a physical therapist. Your physical therapist will move your knee without putting weight on it (passive exercise). This helps to keep your knee from becoming stiff. After your leg has healed, your health care provider will show you how to do range-of-motion exercises to strengthen your knee muscles and prevent stiffness. Follow these instructions at home: If you have a brace or splint:  Wear it as directed by your health care provider. Remove it only as directed by your health care provider.  Loosen the brace or splint if your toes become numb and tingle, or if they turn cold and blue. Bathing  Cover the brace or splint with a watertight plastic bag to protect it  from water while you bathe or shower. Do not let the brace or splint get wet. Managing pain, stiffness, and swelling  If directed, apply ice to the injured area: ? Put ice in a plastic bag. ? Place a towel between your skin and the bag. ? Leave the ice on for 20 minutes, 2-3 times a day.  Move your toes and ankle often to avoid stiffness and to lessen swelling.  Raise the injured area above the level of your heart while you are sitting or lying down. Driving  Do not drive or operate heavy machinery while taking pain  medicine.  Do not drive while wearing a brace or a splint on a leg that you use for driving. Activity  Return to your normal activities as directed by your health care provider. Ask your health care provider what activities are safe for you.  Perform range-of-motion exercises only as directed by your health care provider. Safety  Do not use the injured limb to support your body weight until your health care provider says that you can. Use crutches, a scooter, a walker, or a wheelchair as directed by your health care provider. General instructions  Keep the brace or splint clean and dry.  Do not use any tobacco products, including cigarettes, chewing tobacco, or electronic cigarettes. Tobacco can delay bone healing. If you need help quitting, ask your health care provider.  Take medicines only as directed by your health care provider.  Keep all follow-up visits as directed by your health care provider. This is important. Contact a health care provider if:  Your pain is getting worse.  Your pain medicine is not helping. Get help right away if:  You have very bad pain in your injured leg.  You have swelling or redness in your foot that is getting worse.  You begin to lose feeling in your foot or your toes.  Your foot or your toes are cold or turn blue. This information is not intended to replace advice given to you by your health care provider. Make sure you discuss any questions you have with your health care provider. Document Released: 03/06/2005 Document Revised: 11/02/2015 Document Reviewed: 01/12/2014 Elsevier Interactive Patient Education  2018 [employer].

## 2017-04-28 NOTE — Clinical Social Work Placement (Addendum)
   CLINICAL SOCIAL WORK PLACEMENT  NOTE  Date:  04/28/2017  Patient Details  Name: Alexandra Booth MRN: [id number] Date of Birth: 01-29-1924  Clinical Social Work is seeking post-discharge placement for this patient at the [place] level of care (*CSW will initial, date and re-position this form in  chart as items are completed):  Yes   Patient/family provided with [place] Work Department's list of facilities offering this level of care within the geographic area requested by the patient (or if unable, by the patient's family).  Yes   Patient/family informed of their freedom to choose among providers that offer the needed level of care, that participate in Medicare, Medicaid or managed care program needed by the patient, have an available bed and are willing to accept the patient.  Yes   Patient/family informed of [facility]'s ownership interest in [facility] and [facility], as well as of the fact that they are under no obligation to receive care at these facilities.  PASRR submitted to EDS on       PASRR number received on       Existing PASRR number confirmed on 04/28/17     FL2 transmitted to all facilities in geographic area requested by pt/family on       FL2 transmitted to all facilities within larger geographic area on 04/28/17     Patient informed that his/her managed care company has contracts with or will negotiate with certain facilities, including the following:        Yes   Patient/family informed of bed offers received.  Patient chooses bed at [facility]     Physician recommends and patient chooses bed at      Patient to be transferred to [facility] on 04/28/17.  Patient to be transferred to facility by [facility]     Patient family notified on 04/28/17 of transfer.  Name of family member notified:  [facility] Education officer, museum, friend at bedside, pt responsible for self  PHYSICIAN Please prepare prescriptions, Please  prepare priority discharge summary, including medications, Please sign DNR     Additional Comment:    _______________________________________________ Normajean Baxter, LCSW 04/28/2017, 3:15 PM

## 2017-04-28 NOTE — Clinical Social Work Note (Signed)
Clinical Social Work Assessment  Patient Details  Name: Alexandra Booth MRN: [id number] Date of Birth: [birth year]  Date of referral:  04/28/17               Reason for consult:  Facility Placement                Permission sought to share information with:  Chartered certified accountant granted to share information::  Yes, Verbal Permission Granted  Name::     grandaughter , Education officer, museum::  SNF  Relationship::     Contact Information:     Housing/Transportation Living arrangements for the past 2 months:  Apartment Source of Information:  Patient Patient Interpreter Needed:  None Criminal Activity/Legal Involvement Pertinent to Current Situation/Hospitalization:  No - Comment as needed Significant Relationships:  Other Family Members Lives with:  Self Do you feel safe going back to the place where you live?  No Need for family participation in patient care:  Yes (Comment)  Care giving concerns:  Pt resides alone and manages ADL's independently. Pt has had multiple falls at home. Pt has no one to care for her at home given new impairment and will need short term rehab. Pt may need to consider long term care planning options as well given her age and falls risk.  Social Worker assessment / plan:  CSW met with patient to discuss SNF options. Pt is managed by [facility] and they have SNF's that they are in contract with-[facility], [facility] and [facility]. Pt indicated that she does not want [facility] or [facility]. CSW discussed [facility] and patient indicated that she does not know much about that SNF. CSW validated. CSW will f/u with the Nurse or Social Worker of [facility] to discuss disposition.  Employment status:  Retired Nurse, adult PT Recommendations:  [place] / Referral to community resources:  [place]  Patient/Family's Response to care:  Patient was resting during the assessment and was  cooperative. CSW will f/u as patient had no issues or concerns with the [facility] process and appeared indifferent.  Patient/Family's Understanding of and Emotional Response to Diagnosis, Current Treatment, and Prognosis:  Patient has understanding of the diagnosis, current treatment and prognosis as she accepts that she is in need of short term rehab and identifies the SNF's available. CSW will assist with disposition and coordinate with [facility] on SNF as patient does not want to to go to [facility] or [facility]. CSW will continue to follow.  Emotional Assessment Appearance:  Appears stated age Attitude/Demeanor/Rapport:  (Cooperative) Affect (typically observed):  Accepting, Flat Orientation:  Oriented to Self, Oriented to Place, Oriented to  Time, Oriented to Situation Alcohol / Substance use:  Not Applicable Psych involvement (Current and /or in the community):  No (Comment)  Discharge Needs  Concerns to be addressed:  Care Coordination Readmission within the last 30 days:  No Current discharge risk:  Dependent with Mobility, Lives alone, Physical Impairment Barriers to Discharge:  No Barriers Identified   Normajean Baxter, LCSW 04/28/2017, 12:50 PM

## 2017-04-28 NOTE — Discharge Summary (Signed)
Name: Alexandra Booth MRN: [id number] DOB: 1923/12/26 83 y.o. PCP: Janifer Adie, MD  Date of Admission: 04/25/2017  5:54 PM Date of Discharge: 04/28/2017 Attending Physician: Aldine Contes, MD  Discharge Diagnosis:  Principal Problem:   Tibial plateau fracture Active Problems:   Hypothyroidism   T2DM (type 2 diabetes mellitus) ([place])   Dementia   HYPERTENSION   Fall   Chronic kidney disease (CKD), stage III (moderate) (HCC)   Osteoporosis   GERD (gastroesophageal reflux disease)   Tibial plateau fracture, right   Pulmonary mass   Discharge Medications: Allergies as of 04/28/2017      Reactions   Diazepam    REACTION: Unknown reaction   Penicillins    REACTION: rash, itching   Sulfonamide Derivatives    REACTION: GI upset      Medication List    STOP taking these medications   enoxaparin 30 MG/0.3ML injection Commonly known as:  LOVENOX     TAKE these medications   acetaminophen 650 MG CR tablet Commonly known as:  TYLENOL Take 650 mg 2 (two) times daily by mouth.   ALPRAZolam 0.5 MG tablet Commonly known as:  XANAX Take 1 tablet (0.5 mg total) by mouth at bedtime.   aspirin 81 MG chewable tablet Chew 1 tablet (81 mg total) by mouth daily.   BIOFREEZE EX Apply 1 application topically 3 (three) times daily. 3% Apply to knees   calcium carbonate 1250 (500 Ca) MG tablet Commonly known as:  OS-CAL - dosed in mg of elemental calcium Take 1 tablet (500 mg of elemental calcium total) by mouth daily with breakfast.   Cholecalciferol 1000 units capsule Take 1,000 Units by mouth daily.   donepezil 10 MG tablet Commonly known as:  ARICEPT Take 10 mg by mouth at bedtime.   escitalopram 10 MG tablet Commonly known as:  LEXAPRO Take 10 mg by mouth daily.   feeding supplement (ENSURE ENLIVE) Liqd Take 237 mLs by mouth 2 (two) times daily between meals.   HYDROcodone-acetaminophen 5-325 MG tablet Commonly known as:  NORCO Take 1-2 tablets by  mouth every 6 (six) hours as needed. What changed:    how much to take  when to take this   iron polysaccharides 150 MG capsule Commonly known as:  NIFEREX Take 150 mg 2 (two) times daily by mouth.   levothyroxine 25 MCG tablet Commonly known as:  SYNTHROID, LEVOTHROID Take 25 mcg by mouth daily before breakfast.   losartan 50 MG tablet Commonly known as:  COZAAR Take 50 mg by mouth daily.   Melatonin 3 MG Tabs Take 1 tablet by mouth at bedtime.   memantine 10 MG tablet Commonly known as:  NAMENDA Take 10 mg by mouth 2 (two) times daily.   multivitamin with minerals tablet Take 1 tablet by mouth daily.   nystatin powder Generic drug:  nystatin Apply 1 Bottle topically 3 (three) times daily as needed (skin irritation).   omeprazole 40 MG capsule Commonly known as:  PRILOSEC Take 40 mg by mouth 2 (two) times daily.   phenazopyridine 200 MG tablet Commonly known as:  PYRIDIUM Take 200 mg by mouth daily as needed for pain (painful urination).   polyethylene glycol packet Commonly known as:  MIRALAX / GLYCOLAX Take 17 g by mouth daily as needed for mild constipation. What changed:  when to take this   senna 8.6 MG tablet Commonly known as:  SENOKOT Take 1 tablet by mouth 2 (two) times daily.   solifenacin 5  MG tablet Commonly known as:  VESICARE Take 5 mg by mouth daily.   SYSTANE 0.4-0.3 % Soln Generic drug:  Polyethyl Glycol-Propyl Glycol Apply 1 drop to eye 2 (two) times daily as needed (dry eyes).   vitamin B-12 500 MCG tablet Commonly known as:  CYANOCOBALAMIN Take 500 mcg by mouth daily.            Discharge Care Instructions  (From admission, onward)        Start     Ordered   04/27/17 0000  Non weight bearing    Question Answer Comment  Laterality right   Extremity Lower      04/27/17 2011      Disposition and follow-up:   Ms.Verneal L No was discharged from [facility] in good condition.  At the hospital  follow up visit please address:  1. Right tibial plateau fracture: nonweightbearing, follow up with orthopedics and primary care     Right hilar mass: please allow [facility] physician Dr. Loetta Rough to discuss with patient regarding whether she wants to pursue workup for the mass  Macrocytic anemia: check pending vitamin b12 and folate levels and adjust dosage of medication accordingly  2.  Labs / imaging needed at time of follow-up: BMP to check creatinine  3.  Pending labs/ test needing follow-up: Folate, vitamin B12  Follow-up Appointments: Follow-up Information    Marybelle Killings, MD Follow up in 2 week(s).   Specialty:  Orthopedic Surgery Contact information: [place] [address] [place] [facility] Course by problem list:  Right tibial plateau fracture The patient presented post a fall in her bathroom and she was found to have a right proximal tibial plateau fracture on x-ray and CT scan.  The patient was seen by orthopedic surgery to not have operative management with nonweightbearing for 6 weeks and was placed in a right knee immobilizer.  She was seen by PT and OT and deemed to be a candidate for skilled nursing facility.  Patient remains comfortable and is alert and oriented x3.  Right inferior hilar mass Chest x-ray noted a right inferior hilar mass that needs to be further evaluated by CT with contrast.  We spoke with Dr. Loetta Rough who mentioned that he wants to speak to patient about whether she would like to be evaluated for the mass prior to determining whether to work it up. Will defer to [facility] for outpatient workup.  Discharge Vitals:   BP 136/80 (BP Location: Left Arm)   Pulse 73   Temp 98.2 F (36.8 C) (Oral)   Resp 18   SpO2 96%   Pertinent Labs, Studies, and Procedures:   Chest x-ray (04/25/17): Possible right inferior hilar mass, stable mild chronic bronchitic changes, mild cardiomegaly  X-ray right tibia/fibula  (04/25/2017): Diffuse soft tissue swelling, no acute bony abnormality, no evidence of fracture  Right knee x-ray (04/25/17): Acute depressed fracture of the lateral tibial plateau.  Large lipohemarthrosis.  CT right knee (04/25/2017): Osteopenia, acute comminuted mildly displaced lateral and posterior tibial plateau fracture with minimal displacement.  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  Probable chondrocalcinosis of the lateral joint space.   Discharge Instructions: Discharge Instructions    Call MD for:  extreme fatigue   Complete by:  As directed    Call MD for:  persistant dizziness or light-headedness   Complete by:  As directed    Call MD for:  persistant nausea and vomiting  Complete by:  As directed    Call MD for:  temperature >100.4   Complete by:  As directed    Diet - low sodium heart healthy   Complete by:  As directed    Increase activity slowly   Complete by:  As directed    Non weight bearing   Complete by:  As directed    Laterality:  right   Extremity:  Lower      Signed: Lars Mage, MD Internal Medicine PGY1 [phone] 04/28/2017, 3:41 PM

## 2017-04-28 NOTE — Progress Notes (Signed)
   Subjective: Alexandra Booth was seen laying in her bed this morning eating breakfast. She was doing well. When asked she states that she has pain all over due to her arthritis.   Objective:  Vital signs in last 24 hours: Vitals:   04/26/17 2000 04/27/17 1459 04/27/17 2054 04/28/17 0522  BP: (!) 154/54 (!) 147/46 (!) 157/51 (!) 155/49  Pulse: 72 70 75 61  Resp: 18  16 16   Temp: 98.3 F (36.8 C) 97.6 F (36.4 C) 98.3 F (36.8 C) 97.7 F (36.5 C)  TempSrc: Oral Oral Oral Oral  SpO2: 100% 96% 97% 98%   Physical Exam  Constitutional: She is oriented to person, place, and time. She appears well-developed and well-nourished. No distress.  HENT:  Head: Normocephalic and atraumatic.  Eyes: Conjunctivae are normal.  Cardiovascular: Normal rate, regular rhythm and normal heart sounds.  Pulmonary/Chest: Breath sounds normal. No stridor. No respiratory distress.  Abdominal: Soft. Bowel sounds are normal. She exhibits no distension. There is no tenderness.  Musculoskeletal: She exhibits no tenderness.  Right foot externally rotated and resting in knee immobilizer  Neurological: She is alert and oriented to person, place, and time.  Skin: She is not diaphoretic.  Psychiatric: She has a normal mood and affect. Her behavior is normal. Judgment and thought content normal.   Assessment/Plan:  Right tibial plateau fracture The patient presented following a fall in her bathroom yesterday 04/25/2017.    -Orthopedic surgery placed the patient on nonweightbearing nonoperative therapy at this time and they will reevaluate the patient at 04/27/2017 -PT and OT saw the patient and recommended SNF -The patient has osteoporosis by definition as patient has had a fracture from from falling from a standing position.  Spoke to Dr. Loetta Rough who stated that he may consider starting fosamax once weekly. -Knee in knee immobilizer -Continue IV morphine 4mg  q3h PRN; homeNorco5-325mg  q6hPRN for pain  Right  inferior hilar mass Chest x-ray noted a right inferior hilar mass that needs to be further evaluated by CT with contrast.   -Spoke with Dr. Loetta Rough who mentioned that he wants to speak to patient about whether she would like to be evaluated for the mass prior to determining whether to work it up. Will defer to [facility] for outpatient workup.  Hypertension His blood pressure over the past 24 hours has ranged 147-157/46-51. -Continue losartan 50 mg daily  Dispo: Anticipated discharge in approximately 0-1 day(s).   Lars Mage, MD Internal Medicine PGY1 Pager:[phone] 04/28/2017, 6:57 AM

## 2017-04-29 LAB — FOLATE RBC
Folate, Hemolysate: 437.2 ng/mL
Folate, RBC: 1529 ng/mL (ref >498)
Hematocrit: 28.6 % — ABNORMAL LOW (ref 34.0–46.6)

## 2017-04-29 LAB — VITAMIN B12: VITAMIN B 12: 2303 pg/mL — AB (ref 180–914)

## 2017-04-29 NOTE — Progress Notes (Signed)
Patient left unit via [facility] to [facility].  Patient alert and oriented x 4.  She refused pain medication before transport.

## 2017-05-12 ENCOUNTER — Ambulatory Visit (INDEPENDENT_AMBULATORY_CARE_PROVIDER_SITE_OTHER): Payer: Medicare (Managed Care)

## 2017-05-12 ENCOUNTER — Encounter (INDEPENDENT_AMBULATORY_CARE_PROVIDER_SITE_OTHER): Payer: Self-pay | Admitting: Orthopaedic Surgery

## 2017-05-12 ENCOUNTER — Ambulatory Visit (INDEPENDENT_AMBULATORY_CARE_PROVIDER_SITE_OTHER): Payer: Medicare (Managed Care) | Admitting: Orthopaedic Surgery

## 2017-05-12 DIAGNOSIS — S82141A Displaced bicondylar fracture of right tibia, initial encounter for closed fracture: Secondary | ICD-10-CM | POA: Diagnosis not present

## 2017-05-12 NOTE — Progress Notes (Signed)
Office Visit Note   Patient: Alexandra Booth           Date of Birth: 1923/09/17           MRN: [id number] Visit Date: 05/12/2017              Requested by: Janifer Adie, MD [address] [place], [place] [address] PCP: Janifer Adie, MD   Assessment & Plan: Visit Diagnoses:  1. Fracture of right tibial plateau, closed, initial encounter     Plan: Impression is stable nondisplaced lateral tibial plateau fracture.  Continue nonweightbearing for at least 4 weeks.  Prescription for Bledsoe brace with [employer].  Follow-up in 4 weeks with 2 view x-rays of the right knee.  Follow-Up Instructions: Return in about 4 weeks (around 06/09/2017).   Orders:  Orders Placed This Encounter  Procedures  . XR Knee 1-2 Views Right   No orders of the defined types were placed in this encounter.     Procedures: No procedures performed   Clinical Data: No additional findings.   Subjective: Chief Complaint  Patient presents with  . Right Leg - Follow-up    Patient follows up today for her nondisplaced lateral tibial plateau fracture.  She is currently staying at a nursing home.  Denies any significant complaints or pain.    Review of Systems   Objective: Vital Signs: There were no vitals taken for this visit.  Physical Exam  Ortho Exam Right knee exam is stable. Specialty Comments:  No specialty comments available.  Imaging: Xr Knee 1-2 Views Right  Result Date: 05/12/2017 Stable lateral tibial plateau fracture    PMFS History: Patient Active Problem List   Diagnosis Date Noted  . Tibial plateau fracture, right 04/25/2017  . Pulmonary mass 04/25/2017  . Tibial plateau fracture 04/25/2017  . Fracture of tibial shaft, left, closed 12/18/2016  . Fracture of left proximal fibula 12/18/2016  . GERD (gastroesophageal reflux disease)   . Depression with anxiety   . Fever 09/14/2014  . Compression fracture of body of thoracic vertebra ([place]) 09/14/2014  .  Osteoporosis 09/14/2014  . Irregular heart beats 09/14/2014  . Abnormal chest x-ray   . Acute blood loss anemia   . Macrocytic anemia   . Obesity 09/12/2014  . Fall 09/11/2014  . UTI (lower urinary tract infection) 09/11/2014  . Chronic kidney disease (CKD), stage III (moderate) ([place]) 09/11/2014  . Fracture, proximal femur ([place]) 09/11/2014  . Femur fracture, left ([place]) 09/11/2014  . Opacity of lung on imaging study 09/11/2014  . URINARY RETENTION 10/17/2008  . HYPERTENSION 09/07/2008  . HYPOKALEMIA 07/21/2008  . Dementia 07/21/2008  . KNEE PAIN, BILATERAL 05/17/2008  . ACCIDENTAL FALL, HX OF 05/17/2008  . ABDOMINAL PAIN OTHER SPECIFIED SITE 02/05/2008  . DYSPHAGIA UNSPECIFIED 01/13/2008  . Anemia 09/21/2007  . THYROIDECTOMY, HX OF 09/03/2007  . LAMINECTOMY, LUMBAR, HX OF 09/03/2007  . INTERTRIGO, CANDIDAL 06/24/2007  . CARCINOID TUMOR 05/01/2007  . CHEST PAIN 05/01/2007  . HEMORRHOIDS, EXTERNAL 12/23/2006  . 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 Hypothyroidism 04/28/2006  . T2DM (type 2 diabetes mellitus) ([place]) 04/28/2006  . HYPERLIPIDEMIA 04/28/2006  . DEPRESSION 04/28/2006  . Coronary atherosclerosis 04/28/2006  . [place] DISEASE, LUMBAR SPINE 04/28/2006  . LUMBAR RADICULOPATHY 04/28/2006   Past Medical History:  Diagnosis Date  . Depression with anxiety   . GERD (gastroesophageal reflux disease)   . Hypothyroidism     Family History  Problem Relation Age of  Onset  . Kidney disease Mother   . Kidney disease Father     Past Surgical History:  Procedure Laterality Date  . FEMUR IM NAIL Left 09/12/2014   Procedure: INTRAMEDULLARY (IM) NAIL FEMORAL;  Surgeon: Leandrew Koyanagi, MD;  Location: [facility];  Service: Orthopedics;  Laterality: Left;  . LAMINECTOMY    . TIBIA IM NAIL INSERTION Left 12/19/2016   Procedure: INTRAMEDULLARY (IM) NAIL TIBIAL;  Surgeon: Leandrew Koyanagi, MD;  Location: [facility];  Service: Orthopedics;  Laterality: Left;   Social  History   Occupational History  . Not on file  Tobacco Use  . Smoking status: Former Research scientist (life sciences)  . Smokeless tobacco: Never Used  Substance and Sexual Activity  . Alcohol use: No    Alcohol/week: 0.0 oz  . Drug use: No  . Sexual activity: Not on file

## 2017-06-09 ENCOUNTER — Ambulatory Visit (INDEPENDENT_AMBULATORY_CARE_PROVIDER_SITE_OTHER): Payer: Medicare (Managed Care) | Admitting: Orthopaedic Surgery

## 2017-06-16 ENCOUNTER — Ambulatory Visit (INDEPENDENT_AMBULATORY_CARE_PROVIDER_SITE_OTHER): Payer: Medicare (Managed Care)

## 2017-06-16 ENCOUNTER — Ambulatory Visit (INDEPENDENT_AMBULATORY_CARE_PROVIDER_SITE_OTHER): Payer: Medicare (Managed Care) | Admitting: Orthopaedic Surgery

## 2017-06-16 ENCOUNTER — Encounter (INDEPENDENT_AMBULATORY_CARE_PROVIDER_SITE_OTHER): Payer: Self-pay | Admitting: Orthopaedic Surgery

## 2017-06-16 DIAGNOSIS — M25561 Pain in right knee: Secondary | ICD-10-CM

## 2017-06-16 DIAGNOSIS — G8929 Other chronic pain: Secondary | ICD-10-CM | POA: Diagnosis not present

## 2017-06-16 DIAGNOSIS — S82141A Displaced bicondylar fracture of right tibia, initial encounter for closed fracture: Secondary | ICD-10-CM

## 2017-06-16 NOTE — Progress Notes (Signed)
Patient ID: Alexandra Booth, female   DOB: 28-Apr-1924, 84 y.o.   MRN: [id number]  Patient is 6 weeks status post minimally displaced right tibial plateau fracture.  She is ambulating wheelchair.  She feels that she is very weak.  Physical exam shows a well fitting brace.  She has no joint effusion.  Her x-rays show stable alignment of fracture without any collapse.  He does have generalized osteopenia.  At this point we will allow her to weight-bear as tolerated with physical therapy to mobilize.  Wean brace as tolerated.  At her age and with the multiple injuries that she has had I doubt that she will ambulate any significant distances for the rest of her life.  I will see her back in 6 weeks with 2 view x-rays of the right knee.  If x-rays show stable alignment of the fracture anticipate discharging at that time

## 2017-07-31 ENCOUNTER — Ambulatory Visit (INDEPENDENT_AMBULATORY_CARE_PROVIDER_SITE_OTHER): Payer: Medicare (Managed Care) | Admitting: Orthopaedic Surgery

## 2017-12-07 ENCOUNTER — Encounter (HOSPITAL_COMMUNITY): Payer: Self-pay | Admitting: Emergency Medicine

## 2017-12-07 ENCOUNTER — Other Ambulatory Visit: Payer: Self-pay

## 2017-12-07 ENCOUNTER — Emergency Department (HOSPITAL_COMMUNITY)
Admission: EM | Admit: 2017-12-07 | Discharge: 2017-12-07 | Disposition: A | Discharge status: Home / Self Care | Payer: Medicare (Managed Care) | Attending: Emergency Medicine | Admitting: Emergency Medicine

## 2017-12-07 ENCOUNTER — Emergency Department (HOSPITAL_COMMUNITY): Payer: Medicare (Managed Care)

## 2017-12-07 DIAGNOSIS — R109 Unspecified abdominal pain: Secondary | ICD-10-CM | POA: Diagnosis not present

## 2017-12-07 DIAGNOSIS — Z79899 Other long term (current) drug therapy: Secondary | ICD-10-CM | POA: Insufficient documentation

## 2017-12-07 DIAGNOSIS — E119 Type 2 diabetes mellitus without complications: Secondary | ICD-10-CM | POA: Diagnosis not present

## 2017-12-07 DIAGNOSIS — M25562 Pain in left knee: Secondary | ICD-10-CM | POA: Insufficient documentation

## 2017-12-07 DIAGNOSIS — I129 Hypertensive chronic kidney disease with stage 1 through stage 4 chronic kidney disease, or unspecified chronic kidney disease: Secondary | ICD-10-CM | POA: Diagnosis not present

## 2017-12-07 DIAGNOSIS — M25551 Pain in right hip: Secondary | ICD-10-CM | POA: Diagnosis not present

## 2017-12-07 DIAGNOSIS — M25552 Pain in left hip: Secondary | ICD-10-CM | POA: Insufficient documentation

## 2017-12-07 DIAGNOSIS — L89159 Pressure ulcer of sacral region, unspecified stage: Secondary | ICD-10-CM | POA: Insufficient documentation

## 2017-12-07 DIAGNOSIS — M25561 Pain in right knee: Secondary | ICD-10-CM | POA: Diagnosis not present

## 2017-12-07 DIAGNOSIS — N183 Chronic kidney disease, stage 3 (moderate): Secondary | ICD-10-CM | POA: Insufficient documentation

## 2017-12-07 DIAGNOSIS — F0390 Unspecified dementia without behavioral disturbance: Secondary | ICD-10-CM | POA: Diagnosis not present

## 2017-12-07 DIAGNOSIS — R748 Abnormal levels of other serum enzymes: Secondary | ICD-10-CM

## 2017-12-07 DIAGNOSIS — R7989 Other specified abnormal findings of blood chemistry: Secondary | ICD-10-CM | POA: Diagnosis not present

## 2017-12-07 DIAGNOSIS — G8929 Other chronic pain: Secondary | ICD-10-CM

## 2017-12-07 DIAGNOSIS — M791 Myalgia, unspecified site: Secondary | ICD-10-CM | POA: Diagnosis present

## 2017-12-07 DIAGNOSIS — E039 Hypothyroidism, unspecified: Secondary | ICD-10-CM | POA: Diagnosis not present

## 2017-12-07 HISTORY — DX: Unspecified dementia, unspecified severity, without behavioral disturbance, psychotic disturbance, mood disturbance, and anxiety: F03.90

## 2017-12-07 HISTORY — DX: Type 2 diabetes mellitus without complications: E11.9

## 2017-12-07 LAB — COMPREHENSIVE METABOLIC PANEL
ALBUMIN: 3.1 g/dL — AB (ref 3.5–5.0)
ALK PHOS: 63 U/L (ref 38–126)
ALT: 15 U/L (ref 0–44)
AST: 23 U/L (ref 15–41)
Anion gap: 7 (ref 5–15)
BILIRUBIN TOTAL: 0.4 mg/dL (ref 0.3–1.2)
BUN: 42 mg/dL — ABNORMAL HIGH (ref 8–23)
CALCIUM: 9.3 mg/dL (ref 8.9–10.3)
CO2: 25 mmol/L (ref 22–32)
CREATININE: 1.99 mg/dL — AB (ref 0.44–1.00)
Chloride: 112 mmol/L — ABNORMAL HIGH (ref 98–111)
GFR calc Af Amer: 24 mL/min — ABNORMAL LOW (ref >60)
GFR calc non Af Amer: 21 mL/min — ABNORMAL LOW (ref >60)
GLUCOSE: 175 mg/dL — AB (ref 70–99)
Potassium: 4.6 mmol/L (ref 3.5–5.1)
SODIUM: 144 mmol/L (ref 135–145)
Total Protein: 7 g/dL (ref 6.5–8.1)

## 2017-12-07 LAB — CBC WITH DIFFERENTIAL/PLATELET
Basophils Absolute: 0 10*3/uL (ref 0.0–0.1)
Basophils Relative: 0 %
EOS PCT: 2 %
Eosinophils Absolute: 0.1 10*3/uL (ref 0.0–0.7)
HEMATOCRIT: 31.6 % — AB (ref 36.0–46.0)
Hemoglobin: 10.1 g/dL — ABNORMAL LOW (ref 12.0–15.0)
LYMPHS ABS: 1.2 10*3/uL (ref 0.7–4.0)
LYMPHS PCT: 17 %
MCH: 32.1 pg (ref 26.0–34.0)
MCHC: 32 g/dL (ref 30.0–36.0)
MCV: 100.3 fL — AB (ref 78.0–100.0)
MONO ABS: 0.4 10*3/uL (ref 0.1–1.0)
Monocytes Relative: 6 %
Neutro Abs: 5.1 10*3/uL (ref 1.7–7.7)
Neutrophils Relative %: 75 %
Platelets: 238 10*3/uL (ref 150–400)
RBC: 3.15 MIL/uL — ABNORMAL LOW (ref 3.87–5.11)
RDW: 13.8 % (ref 11.5–15.5)
WBC: 6.8 10*3/uL (ref 4.0–10.5)

## 2017-12-07 LAB — LIPASE, BLOOD: Lipase: 164 U/L — ABNORMAL HIGH (ref 11–51)

## 2017-12-07 LAB — TROPONIN I: Troponin I: 0.03 ng/mL (ref <0.03)

## 2017-12-07 MED ORDER — MORPHINE SULFATE (PF) 4 MG/ML IV SOLN
4.0000 mg | Freq: Once | INTRAVENOUS | Status: AC
  Administered 2017-12-07: 4 mg via INTRAVENOUS
  Filled 2017-12-07: qty 1

## 2017-12-07 MED ORDER — DOCUSATE SODIUM 100 MG PO CAPS: 100.0000 mg | ORAL_CAPSULE | Freq: Two times a day (BID) | ORAL | 0 refills | Status: DC | PRN

## 2017-12-07 MED ORDER — SODIUM CHLORIDE 0.9 % IV BOLUS
500.0000 mL | Freq: Once | INTRAVENOUS | Status: AC
  Administered 2017-12-07: 500 mL via INTRAVENOUS

## 2017-12-07 NOTE — Discharge Instructions (Addendum)
You were evalauated for worsening of your chronic pain in joints along with feeling constipated. We found you to have some skin breakdown on your sacrum (backside.) This will need to be followed by your doctor and wound clinic. Your are on a laxative Miralax and should continue this. You are also on pain meds and should continue those. We are also prescribing you a  new constipation med. You will need repeat lab work this week with your doctor. Please call them tomorrow.

## 2017-12-07 NOTE — ED Provider Notes (Signed)
[facility] DEPT Provider Note   CSN: [id number] Arrival date & time: 12/07/17  1227     History   Chief Complaint Chief Complaint  Patient presents with  . generalized pain    HPI Alexandra Booth is a 84 y.o. female.  Level 5 caveat secondary to dementia.  Her by ambulance from home complaining of pain all over.  She says her feet hurt her legs  her abdomen hurts and she has not had a bowel movement for a day or 2.  She is on pain medicine at home she says these are not helping.  When asked her she tried a laxative she said she did not know.  She is unable to explain why today's pain is worse than any other day and she denies any recent falls or other changes to her medications.  She states she lives at home with her caregiver but is not sure if she is coming here.  The history is provided by the patient.  Abdominal Pain   Chronicity: unknown. The pain is located in the generalized abdominal region.    Past Medical History:  Diagnosis Date  . Dementia   . Depression with anxiety   . Diabetes mellitus without complication ([place])   . GERD (gastroesophageal reflux disease)   . Hypothyroidism     Patient Active Problem List   Diagnosis Date Noted  . Tibial plateau fracture, right 04/25/2017  . Pulmonary mass 04/25/2017  . Tibial plateau fracture 04/25/2017  . Fracture of tibial shaft, left, closed 12/18/2016  . Fracture of left proximal fibula 12/18/2016  . GERD (gastroesophageal reflux disease)   . Depression with anxiety   . Fever 09/14/2014  . Compression fracture of body of thoracic vertebra ([place]) 09/14/2014  . Osteoporosis 09/14/2014  . Irregular heart beats 09/14/2014  . Abnormal chest x-ray   . Acute blood loss anemia   . Macrocytic anemia   . Obesity 09/12/2014  . Fall 09/11/2014  . UTI (lower urinary tract infection) 09/11/2014  . Chronic kidney disease (CKD), stage III (moderate) ([place]) 09/11/2014  . Fracture, proximal femur  ([place]) 09/11/2014  . Femur fracture, left ([place]) 09/11/2014  . Opacity of lung on imaging study 09/11/2014  . URINARY RETENTION 10/17/2008  . HYPERTENSION 09/07/2008  . HYPOKALEMIA 07/21/2008  . Dementia 07/21/2008  . KNEE PAIN, BILATERAL 05/17/2008  . ACCIDENTAL FALL, HX OF 05/17/2008  . ABDOMINAL PAIN OTHER SPECIFIED SITE 02/05/2008  . DYSPHAGIA UNSPECIFIED 01/13/2008  . Anemia 09/21/2007  . THYROIDECTOMY, HX OF 09/03/2007  . LAMINECTOMY, LUMBAR, HX OF 09/03/2007  . INTERTRIGO, CANDIDAL 06/24/2007  . CARCINOID TUMOR 05/01/2007  . CHEST PAIN 05/01/2007  . HEMORRHOIDS, EXTERNAL 12/23/2006  . 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 Hypothyroidism 04/28/2006  . T2DM (type 2 diabetes mellitus) ([place]) 04/28/2006  . HYPERLIPIDEMIA 04/28/2006  . DEPRESSION 04/28/2006  . Coronary atherosclerosis 04/28/2006  . [place] DISEASE, LUMBAR SPINE 04/28/2006  . LUMBAR RADICULOPATHY 04/28/2006    Past Surgical History:  Procedure Laterality Date  . FEMUR IM NAIL Left 09/12/2014   Procedure: INTRAMEDULLARY (IM) NAIL FEMORAL;  Surgeon: Leandrew Koyanagi, MD;  Location: [facility];  Service: Orthopedics;  Laterality: Left;  . LAMINECTOMY    . TIBIA IM NAIL INSERTION Left 12/19/2016   Procedure: INTRAMEDULLARY (IM) NAIL TIBIAL;  Surgeon: Leandrew Koyanagi, MD;  Location: [facility];  Service: Orthopedics;  Laterality: Left;     OB History   None  Home Medications    Prior to Admission medications   Medication Sig Start Date End Date Taking? Authorizing Provider  acetaminophen (TYLENOL) 650 MG CR tablet Take 650 mg 2 (two) times daily by mouth.    [provider]  ALPRAZolam Duanne Moron) 0.5 MG tablet Take 1 tablet (0.5 mg total) by mouth at bedtime. 12/21/16   Oswald Hillock, MD  aspirin 81 MG chewable tablet Chew 1 tablet (81 mg total) by mouth daily. Patient not taking: Reported on 04/25/2017 12/22/16   Oswald Hillock, MD  calcium carbonate (OS-CAL - DOSED IN MG OF ELEMENTAL  CALCIUM) 1250 (500 CA) MG tablet Take 1 tablet (500 mg of elemental calcium total) by mouth daily with breakfast. Patient not taking: Reported on 04/25/2017 09/19/14   Juluis Mire, MD  Cholecalciferol 1000 UNITS capsule Take 1,000 Units by mouth daily.    [provider]  donepezil (ARICEPT) 10 MG tablet Take 10 mg by mouth at bedtime.    [provider]  enoxaparin (LOVENOX) 30 MG/0.3ML injection Inject 0.3 mLs (30 mg total) into the skin daily. Patient not taking: Reported on 04/25/2017 12/19/16   Leandrew Koyanagi, MD  escitalopram (LEXAPRO) 10 MG tablet Take 10 mg by mouth daily.    [provider]  feeding supplement, ENSURE ENLIVE, (ENSURE ENLIVE) LIQD Take 237 mLs by mouth 2 (two) times daily between meals. Patient not taking: Reported on 04/25/2017 09/19/14   Juluis Mire, MD  HYDROcodone-acetaminophen (NORCO) 5-325 MG tablet Take 1-2 tablets by mouth every 6 (six) hours as needed. Patient taking differently: Take 1 tablet 3 (three) times daily by mouth.  12/19/16   Leandrew Koyanagi, MD  iron polysaccharides (NIFEREX) 150 MG capsule Take 150 mg 2 (two) times daily by mouth.    [provider]  levothyroxine (SYNTHROID, LEVOTHROID) 25 MCG tablet Take 25 mcg by mouth daily before breakfast.    [provider]  losartan (COZAAR) 50 MG tablet Take 50 mg by mouth daily.    [provider]  Melatonin 3 MG TABS Take 1 tablet by mouth at bedtime.    [provider]  memantine (NAMENDA) 10 MG tablet Take 10 mg by mouth 2 (two) times daily.    [provider]  Menthol, Topical Analgesic, (BIOFREEZE EX) Apply 1 application topically 3 (three) times daily. 3% Apply to knees    [provider]  Multiple Vitamins-Minerals (MULTIVITAMIN WITH MINERALS) tablet Take 1 tablet by mouth daily.    [provider]  nystatin (NYSTATIN) powder Apply 1 Bottle topically 3 (three) times daily as needed (skin irritation).    [provider]  omeprazole (PRILOSEC) 40 MG capsule Take 40 mg by mouth 2 (two) times daily.    [provider]  phenazopyridine (PYRIDIUM) 200 MG tablet Take 200 mg by mouth daily as needed for pain (painful urination).    [provider]  Polyethyl Glycol-Propyl Glycol (SYSTANE) 0.4-0.3 % SOLN Apply 1 drop to eye 2 (two) times daily as needed (dry eyes).    [provider]  polyethylene glycol (MIRALAX / GLYCOLAX) packet Take 17 g by mouth daily as needed for mild constipation. Patient taking differently: Take 17 g daily by mouth.  12/21/16   Oswald Hillock, MD  senna (SENOKOT) 8.6 MG tablet Take 1 tablet by mouth 2 (two) times daily.    [provider]  solifenacin (VESICARE) 5 MG tablet Take 5 mg by mouth daily.    [provider]  vitamin B-12 (CYANOCOBALAMIN) 500 MCG tablet Take 500 mcg by mouth daily.    [provider]    Family History Family History  Problem Relation Age of Onset  . Kidney disease Mother   . Kidney disease Father     Social History Social History   Tobacco Use  . Smoking status: Former Research scientist (life sciences)  . Smokeless tobacco: Never Used  Substance Use Topics  . Alcohol use: No    Alcohol/week: 0.0 oz  . Drug use: No     Allergies   Diazepam; Penicillins; and Sulfonamide derivatives   Review of Systems Review of Systems  Unable to perform ROS: Dementia  Gastrointestinal: Positive for abdominal pain.     Physical Exam Updated Vital Signs BP (!) 159/69 (BP Location: Right Arm)   Pulse 74   Resp 14   SpO2 99%   Physical Exam  Constitutional: She appears well-developed and well-nourished.  HENT:  Head: Normocephalic and atraumatic.  Right Ear: External ear normal.  Left Ear: External ear normal.  Nose: Nose normal.  Mouth/Throat: Oropharynx is clear and moist.  Eyes: Pupils are equal, round, and reactive to light. Conjunctivae are normal.  Neck: Normal range of motion. Neck supple.    Cardiovascular: Normal rate, regular rhythm, normal heart sounds and intact distal pulses.  Pulmonary/Chest: Effort normal and breath sounds normal. She has no wheezes. She has no rales.  Abdominal: Soft. She exhibits no mass. There is tenderness (diffuse). There is no rebound and no guarding.  Musculoskeletal: Normal range of motion. She exhibits tenderness (diffuse - legs). She exhibits no deformity.  Neurological: She is alert. She has normal strength. She is disoriented. No sensory deficit. GCS eye subscore is 4. GCS verbal subscore is 5. GCS motor subscore is 6.  Skin: Skin is warm and dry. Capillary refill takes less than 2 seconds.  Psychiatric: She has a normal mood and affect.   Rectal exam she is got normal structure time she gets soft brown stool in the vault.  No masses.  She does have some skin breakdown over her sacrum that seems to be tender but there is no ulceration.Chaperone was present during exam.   ED Treatments / Results  Labs (all labs ordered are listed, but only abnormal results are displayed) Labs Reviewed  COMPREHENSIVE METABOLIC PANEL - Abnormal; Notable for the following components:      Result Value   Chloride 112 (*)    Glucose, Bld 175 (*)    BUN 42 (*)    Creatinine, Ser 1.99 (*)    Albumin 3.1 (*)    GFR calc non Af Amer 21 (*)    GFR calc Af Amer 24 (*)    All other components within normal limits  LIPASE, BLOOD - Abnormal; Notable for the following components:   Lipase 164 (*)    All other components within normal limits  TROPONIN I - Abnormal; Notable for the following components:   Troponin I 0.03 (*)    All other components within normal limits  CBC WITH DIFFERENTIAL/PLATELET - Abnormal; Notable for the following components:   RBC 3.15 (*)    Hemoglobin 10.1 (*)    HCT 31.6 (*)    MCV 100.3 (*)    All other components within normal limits    EKG EKG Interpretation  Date/Time:  Sunday December 07 2017 13:02:13 EDT Ventricular Rate:   81 PR Interval:    QRS Duration: 107 QT Interval:  408 QTC Calculation: 474 R Axis:   -  59 Text Interpretation:  Sinus rhythm Left anterior fascicular block LVH with secondary repolarization abnormality Anterior Q waves, possibly due to LVH more pronounced t wave inversions compared with 11/18 Confirmed by Aletta Edouard [phone]) on 12/07/2017 1:24:58 PM Also confirmed by Aletta Edouard [phone]), editor Lynder Parents [phone])  on 12/08/2017 3:51:50 PM   Radiology Ct Abdomen Pelvis Wo Contrast  Result Date: 12/07/2017 CLINICAL DATA:  Diffuse abdominal pain EXAM: CT ABDOMEN AND PELVIS WITHOUT CONTRAST TECHNIQUE: Multidetector CT imaging of the abdomen and pelvis was performed following the standard protocol without IV contrast. COMPARISON:  05/09/2011 FINDINGS: Lower chest: Scarring is noted in the bases bilaterally. Hepatobiliary: No focal liver abnormality is seen. Status post cholecystectomy. No biliary dilatation. Pancreas: Pancreas is within normal limits. A large duodenal diverticulum is noted adjacent to the head of the pancreas stable from the previous exam. Spleen: Normal in size without focal abnormality. Adrenals/Urinary Tract: Adrenal glands are within normal limits. Kidneys demonstrate renal cystic change on the right. No calculi or obstructive changes are noted. The bladder is decompressed. Stomach/Bowel: The appendix appears of been surgically removed. No inflammatory changes are noted. No obstructive changes are seen. Vascular/Lymphatic: Aortic atherosclerosis. No enlarged abdominal or pelvic lymph nodes. Reproductive: Status post hysterectomy. No adnexal masses. Other: No ascites is noted. A small umbilical fat containing hernia is seen. This is stable from the prior exam. Musculoskeletal: Postsurgical changes in the proximal left femur are seen. Degenerative changes of the lumbar spine are noted. Anterolisthesis of L4 on L5 is noted stable from the previous exam. IMPRESSION: Multiple  chronic changes similar to that seen on the prior study. No new focal abnormality is seen. Electronically Signed   By: Inez Catalina M.D.   On: 12/07/2017 14:23    Procedures Procedures (including critical care time)  Medications Ordered in ED Medications  morphine 4 MG/ML injection 4 mg (has no administration in time range)  sodium chloride 0.9 % bolus 500 mL (has no administration in time range)     Initial Impression / Assessment and Plan / ED Course  I have reviewed the triage vital signs and the nursing notes.  Pertinent labs & imaging results that were available during my care of the patient were reviewed by me and considered in my medical decision making (see chart for details).  Clinical Course as of Dec 10 1010  Sun Dec 07, 2017  1430 Patient complaining of acute on chronic full body pain along with feeling like she might be constipated.  Her exam is fairly benign other than some diffuse abdominal pain.  Her labs are significant for an elevated lipaseBut her CT does not show any evidence of pancreatitis.   [MB]  5170 CT they also do not comment upon increased stool burden.   [MB]  0174 Patient does state she feels a little bit better.  Hard to know what to do with a lipase of 164 as her labs appear fairly baseline.  And her abdominal pain is a not major part of her complaint but more of her joints which sound like a chronic condition.  I think it her backside is bothering her more than the need to move her bowels. The nurse is going to put a sacral dressing on this.  I think she can return home to her facility with close follow-up with her PCP.  She has had no vomiting and she is got a soft abdominal exam with a non-concerning Ct. Will give [place] to patient in paperwork  to have them set up followup.    [MB]    Clinical Course User Index [MB] Hayden Rasmussen, MD     Final Clinical Impressions(s) / ED Diagnoses   Final diagnoses:  Chronic arthralgias of knees  and hips  Pressure injury of skin of sacral region, unspecified injury stage  Elevated lipase    ED Discharge Orders        Ordered    docusate sodium (COLACE) 100 MG capsule  2 times daily PRN,   Status:  Discontinued     12/07/17 1549    docusate sodium (COLACE) 100 MG capsule  2 times daily PRN     12/07/17 1554       Hayden Rasmussen, MD 12/09/17 1012

## 2017-12-07 NOTE — ED Notes (Signed)
Bed: WA21 Expected date: 12/07/17 Expected time: 12:26 PM Means of arrival: Ambulance Comments: Hold Room 13 84 yo leg pain 10/10

## 2017-12-07 NOTE — ED Triage Notes (Signed)
Pt from home with acute on chronic pain. 10/10 generalized pain. Has hx of dementia and DM. [id number]

## 2018-04-29 ENCOUNTER — Emergency Department (HOSPITAL_COMMUNITY): Payer: Medicare (Managed Care)

## 2018-04-29 ENCOUNTER — Encounter (HOSPITAL_COMMUNITY): Payer: Self-pay

## 2018-04-29 ENCOUNTER — Other Ambulatory Visit: Payer: Self-pay

## 2018-04-29 ENCOUNTER — Observation Stay (HOSPITAL_COMMUNITY)
Admission: EM | Admit: 2018-04-29 | Discharge: 2018-05-01 | Disposition: A | Discharge status: Skilled Nursing Facility | Payer: Medicare (Managed Care) | Attending: Family Medicine | Admitting: Family Medicine

## 2018-04-29 DIAGNOSIS — M5136 Other intervertebral disc degeneration, lumbar region: Secondary | ICD-10-CM | POA: Diagnosis not present

## 2018-04-29 DIAGNOSIS — F418 Other specified anxiety disorders: Secondary | ICD-10-CM | POA: Insufficient documentation

## 2018-04-29 DIAGNOSIS — G8929 Other chronic pain: Secondary | ICD-10-CM | POA: Insufficient documentation

## 2018-04-29 DIAGNOSIS — M79605 Pain in left leg: Secondary | ICD-10-CM | POA: Diagnosis not present

## 2018-04-29 DIAGNOSIS — K449 Diaphragmatic hernia without obstruction or gangrene: Secondary | ICD-10-CM | POA: Insufficient documentation

## 2018-04-29 DIAGNOSIS — K219 Gastro-esophageal reflux disease without esophagitis: Secondary | ICD-10-CM | POA: Insufficient documentation

## 2018-04-29 DIAGNOSIS — M545 Low back pain: Secondary | ICD-10-CM | POA: Insufficient documentation

## 2018-04-29 DIAGNOSIS — M2578 Osteophyte, vertebrae: Secondary | ICD-10-CM | POA: Insufficient documentation

## 2018-04-29 DIAGNOSIS — I160 Hypertensive urgency: Secondary | ICD-10-CM | POA: Diagnosis present

## 2018-04-29 DIAGNOSIS — F0390 Unspecified dementia without behavioral disturbance: Secondary | ICD-10-CM | POA: Diagnosis present

## 2018-04-29 DIAGNOSIS — Z880 Allergy status to penicillin: Secondary | ICD-10-CM | POA: Insufficient documentation

## 2018-04-29 DIAGNOSIS — F0391 Unspecified dementia with behavioral disturbance: Secondary | ICD-10-CM

## 2018-04-29 DIAGNOSIS — E039 Hypothyroidism, unspecified: Secondary | ICD-10-CM | POA: Insufficient documentation

## 2018-04-29 DIAGNOSIS — D539 Nutritional anemia, unspecified: Secondary | ICD-10-CM | POA: Diagnosis present

## 2018-04-29 DIAGNOSIS — M6250 Muscle wasting and atrophy, not elsewhere classified, unspecified site: Secondary | ICD-10-CM | POA: Diagnosis not present

## 2018-04-29 DIAGNOSIS — E1122 Type 2 diabetes mellitus with diabetic chronic kidney disease: Secondary | ICD-10-CM | POA: Insufficient documentation

## 2018-04-29 DIAGNOSIS — I491 Atrial premature depolarization: Secondary | ICD-10-CM | POA: Diagnosis not present

## 2018-04-29 DIAGNOSIS — M4316 Spondylolisthesis, lumbar region: Secondary | ICD-10-CM | POA: Insufficient documentation

## 2018-04-29 DIAGNOSIS — I129 Hypertensive chronic kidney disease with stage 1 through stage 4 chronic kidney disease, or unspecified chronic kidney disease: Secondary | ICD-10-CM | POA: Insufficient documentation

## 2018-04-29 DIAGNOSIS — Z9049 Acquired absence of other specified parts of digestive tract: Secondary | ICD-10-CM | POA: Insufficient documentation

## 2018-04-29 DIAGNOSIS — W06XXXA Fall from bed, initial encounter: Secondary | ICD-10-CM | POA: Insufficient documentation

## 2018-04-29 DIAGNOSIS — R918 Other nonspecific abnormal finding of lung field: Secondary | ICD-10-CM | POA: Insufficient documentation

## 2018-04-29 DIAGNOSIS — M79604 Pain in right leg: Secondary | ICD-10-CM | POA: Diagnosis not present

## 2018-04-29 DIAGNOSIS — S72111A Displaced fracture of greater trochanter of right femur, initial encounter for closed fracture: Secondary | ICD-10-CM | POA: Diagnosis present

## 2018-04-29 DIAGNOSIS — Z7982 Long term (current) use of aspirin: Secondary | ICD-10-CM | POA: Insufficient documentation

## 2018-04-29 DIAGNOSIS — N184 Chronic kidney disease, stage 4 (severe): Secondary | ICD-10-CM | POA: Diagnosis present

## 2018-04-29 DIAGNOSIS — K5730 Diverticulosis of large intestine without perforation or abscess without bleeding: Secondary | ICD-10-CM | POA: Diagnosis not present

## 2018-04-29 DIAGNOSIS — N390 Urinary tract infection, site not specified: Secondary | ICD-10-CM | POA: Diagnosis present

## 2018-04-29 DIAGNOSIS — M8580 Other specified disorders of bone density and structure, unspecified site: Secondary | ICD-10-CM | POA: Diagnosis not present

## 2018-04-29 DIAGNOSIS — Z87891 Personal history of nicotine dependence: Secondary | ICD-10-CM | POA: Insufficient documentation

## 2018-04-29 DIAGNOSIS — W19XXXA Unspecified fall, initial encounter: Secondary | ICD-10-CM

## 2018-04-29 DIAGNOSIS — Z9889 Other specified postprocedural states: Secondary | ICD-10-CM | POA: Insufficient documentation

## 2018-04-29 DIAGNOSIS — Z8673 Personal history of transient ischemic attack (TIA), and cerebral infarction without residual deficits: Secondary | ICD-10-CM | POA: Insufficient documentation

## 2018-04-29 DIAGNOSIS — R109 Unspecified abdominal pain: Secondary | ICD-10-CM | POA: Insufficient documentation

## 2018-04-29 DIAGNOSIS — F5105 Insomnia due to other mental disorder: Secondary | ICD-10-CM | POA: Diagnosis not present

## 2018-04-29 DIAGNOSIS — Z79899 Other long term (current) drug therapy: Secondary | ICD-10-CM | POA: Insufficient documentation

## 2018-04-29 DIAGNOSIS — S72114A Nondisplaced fracture of greater trochanter of right femur, initial encounter for closed fracture: Principal | ICD-10-CM | POA: Insufficient documentation

## 2018-04-29 DIAGNOSIS — Z882 Allergy status to sulfonamides status: Secondary | ICD-10-CM | POA: Insufficient documentation

## 2018-04-29 DIAGNOSIS — D631 Anemia in chronic kidney disease: Secondary | ICD-10-CM | POA: Diagnosis not present

## 2018-04-29 LAB — CBC WITH DIFFERENTIAL/PLATELET
Abs Immature Granulocytes: 0.07 10*3/uL (ref 0.00–0.07)
BASOS PCT: 0 %
Basophils Absolute: 0 10*3/uL (ref 0.0–0.1)
EOS PCT: 2 %
Eosinophils Absolute: 0.2 10*3/uL (ref 0.0–0.5)
HCT: 32.9 % — ABNORMAL LOW (ref 36.0–46.0)
HEMOGLOBIN: 10 g/dL — AB (ref 12.0–15.0)
Immature Granulocytes: 1 %
LYMPHS PCT: 19 %
Lymphs Abs: 1.8 10*3/uL (ref 0.7–4.0)
MCH: 33 pg (ref 26.0–34.0)
MCHC: 30.4 g/dL (ref 30.0–36.0)
MCV: 108.6 fL — AB (ref 80.0–100.0)
MONOS PCT: 8 %
Monocytes Absolute: 0.8 10*3/uL (ref 0.1–1.0)
Neutro Abs: 6.5 10*3/uL (ref 1.7–7.7)
Neutrophils Relative %: 70 %
Platelets: 199 10*3/uL (ref 150–400)
RBC: 3.03 MIL/uL — AB (ref 3.87–5.11)
RDW: 11.9 % (ref 11.5–15.5)
WBC: 9.3 10*3/uL (ref 4.0–10.5)
nRBC: 0 % (ref 0.0–0.2)

## 2018-04-29 LAB — COMPREHENSIVE METABOLIC PANEL
ALT: 20 U/L (ref 0–44)
AST: 27 U/L (ref 15–41)
Albumin: 3.2 g/dL — ABNORMAL LOW (ref 3.5–5.0)
Alkaline Phosphatase: 87 U/L (ref 38–126)
Anion gap: 8 (ref 5–15)
BILIRUBIN TOTAL: 0.5 mg/dL (ref 0.3–1.2)
BUN: 38 mg/dL — ABNORMAL HIGH (ref 8–23)
CO2: 26 mmol/L (ref 22–32)
CREATININE: 2.14 mg/dL — AB (ref 0.44–1.00)
Calcium: 9.3 mg/dL (ref 8.9–10.3)
Chloride: 108 mmol/L (ref 98–111)
GFR calc Af Amer: 22 mL/min — ABNORMAL LOW (ref >60)
GFR, EST NON AFRICAN AMERICAN: 19 mL/min — AB (ref >60)
GLUCOSE: 111 mg/dL — AB (ref 70–99)
Potassium: 4.5 mmol/L (ref 3.5–5.1)
Sodium: 142 mmol/L (ref 135–145)
TOTAL PROTEIN: 7.1 g/dL (ref 6.5–8.1)

## 2018-04-29 LAB — URINALYSIS, ROUTINE W REFLEX MICROSCOPIC
Bilirubin Urine: NEGATIVE
Glucose, UA: NEGATIVE mg/dL
Ketones, ur: NEGATIVE mg/dL
Nitrite: NEGATIVE
PROTEIN: 100 mg/dL — AB
SPECIFIC GRAVITY, URINE: 1.012 (ref 1.005–1.030)
pH: 7 (ref 5.0–8.0)

## 2018-04-29 MED ORDER — MEMANTINE HCL 10 MG PO TABS
10.0000 mg | ORAL_TABLET | Freq: Two times a day (BID) | ORAL | Status: DC
  Administered 2018-04-30 – 2018-05-01 (×4): 10 mg via ORAL
  Filled 2018-04-29 (×4): qty 1

## 2018-04-29 MED ORDER — MORPHINE SULFATE (PF) 4 MG/ML IV SOLN
4.0000 mg | Freq: Once | INTRAVENOUS | Status: AC
  Administered 2018-04-29: 4 mg via INTRAMUSCULAR
  Filled 2018-04-29: qty 1

## 2018-04-29 MED ORDER — SENNA 8.6 MG PO TABS
1.0000 | ORAL_TABLET | Freq: Two times a day (BID) | ORAL | Status: DC
  Administered 2018-04-30 – 2018-05-01 (×4): 8.6 mg via ORAL
  Filled 2018-04-29 (×4): qty 1

## 2018-04-29 MED ORDER — SODIUM CHLORIDE 0.9% FLUSH: 3.0000 mL | INTRAVENOUS | Status: DC | PRN

## 2018-04-29 MED ORDER — POLYVINYL ALCOHOL 1.4 % OP SOLN: 1.0000 [drp] | Freq: Two times a day (BID) | OPHTHALMIC | Status: DC | PRN

## 2018-04-29 MED ORDER — POLYETHYL GLYCOL-PROPYL GLYCOL 0.4-0.3 % OP SOLN: 1.0000 [drp] | Freq: Two times a day (BID) | OPHTHALMIC | Status: DC | PRN

## 2018-04-29 MED ORDER — DARIFENACIN HYDROBROMIDE ER 7.5 MG PO TB24: 7.5000 mg | ORAL_TABLET | Freq: Every day | ORAL | Status: DC

## 2018-04-29 MED ORDER — POLYETHYLENE GLYCOL 3350 17 G PO PACK: 17.0000 g | PACK | Freq: Every day | ORAL | Status: DC | PRN

## 2018-04-29 MED ORDER — HEPARIN SODIUM (PORCINE) 5000 UNIT/ML IJ SOLN
5000.0000 [IU] | Freq: Three times a day (TID) | INTRAMUSCULAR | Status: DC
  Administered 2018-04-30 – 2018-05-01 (×6): 5000 [IU] via SUBCUTANEOUS
  Filled 2018-04-29 (×6): qty 1

## 2018-04-29 MED ORDER — ONDANSETRON HCL 4 MG PO TABS: 4.0000 mg | ORAL_TABLET | Freq: Four times a day (QID) | ORAL | Status: DC | PRN

## 2018-04-29 MED ORDER — SODIUM CHLORIDE 0.9% FLUSH
3.0000 mL | Freq: Two times a day (BID) | INTRAVENOUS | Status: DC
  Administered 2018-04-30 – 2018-05-01 (×4): 3 mL via INTRAVENOUS

## 2018-04-29 MED ORDER — MORPHINE SULFATE (PF) 4 MG/ML IV SOLN
4.0000 mg | Freq: Once | INTRAVENOUS | Status: AC
  Administered 2018-04-29: 4 mg via INTRAVENOUS
  Filled 2018-04-29: qty 1

## 2018-04-29 MED ORDER — TRAZODONE HCL 50 MG PO TABS
50.0000 mg | ORAL_TABLET | Freq: Every day | ORAL | Status: DC
  Administered 2018-04-30 (×2): 50 mg via ORAL
  Filled 2018-04-29 (×2): qty 1

## 2018-04-29 MED ORDER — BISACODYL 5 MG PO TBEC: 5.0000 mg | DELAYED_RELEASE_TABLET | Freq: Every day | ORAL | Status: DC | PRN

## 2018-04-29 MED ORDER — HYDROCODONE-ACETAMINOPHEN 5-325 MG PO TABS
1.0000 | ORAL_TABLET | Freq: Once | ORAL | Status: AC
  Administered 2018-04-29: 1 via ORAL
  Filled 2018-04-29: qty 1

## 2018-04-29 MED ORDER — LORAZEPAM 0.5 MG PO TABS
0.2500 mg | ORAL_TABLET | Freq: Two times a day (BID) | ORAL | Status: DC
  Administered 2018-04-30 – 2018-05-01 (×4): 0.25 mg via ORAL
  Filled 2018-04-29 (×4): qty 1

## 2018-04-29 MED ORDER — HYDROCODONE-ACETAMINOPHEN 5-325 MG PO TABS
1.0000 | ORAL_TABLET | ORAL | Status: DC | PRN
  Administered 2018-04-30 (×2): 1 via ORAL
  Filled 2018-04-29 (×2): qty 1

## 2018-04-29 MED ORDER — ENSURE ENLIVE PO LIQD
237.0000 mL | Freq: Two times a day (BID) | ORAL | Status: DC
  Administered 2018-05-01: 237 mL via ORAL

## 2018-04-29 MED ORDER — ESCITALOPRAM OXALATE 20 MG PO TABS
20.0000 mg | ORAL_TABLET | Freq: Every day | ORAL | Status: DC
  Administered 2018-04-30 – 2018-05-01 (×2): 20 mg via ORAL
  Filled 2018-04-29 (×2): qty 1

## 2018-04-29 MED ORDER — LEVOTHYROXINE SODIUM 25 MCG PO TABS
25.0000 ug | ORAL_TABLET | Freq: Every day | ORAL | Status: DC
  Administered 2018-04-30 – 2018-05-01 (×2): 25 ug via ORAL
  Filled 2018-04-29 (×2): qty 1

## 2018-04-29 MED ORDER — DONEPEZIL HCL 10 MG PO TABS
10.0000 mg | ORAL_TABLET | Freq: Every day | ORAL | Status: DC
  Administered 2018-04-30 (×2): 10 mg via ORAL
  Filled 2018-04-29 (×2): qty 1

## 2018-04-29 MED ORDER — ONDANSETRON HCL 4 MG/2ML IJ SOLN
4.0000 mg | Freq: Four times a day (QID) | INTRAMUSCULAR | Status: DC | PRN
  Administered 2018-04-30: 4 mg via INTRAVENOUS
  Filled 2018-04-29: qty 2

## 2018-04-29 MED ORDER — ACETAMINOPHEN 650 MG RE SUPP: 650.0000 mg | Freq: Four times a day (QID) | RECTAL | Status: DC | PRN

## 2018-04-29 MED ORDER — ACETAMINOPHEN 325 MG PO TABS: 650.0000 mg | ORAL_TABLET | Freq: Four times a day (QID) | ORAL | Status: DC | PRN

## 2018-04-29 MED ORDER — SODIUM CHLORIDE 0.9 % IV SOLN: 250.0000 mL | INTRAVENOUS | Status: DC | PRN

## 2018-04-29 MED ORDER — HYDRALAZINE HCL 20 MG/ML IJ SOLN
5.0000 mg | INTRAMUSCULAR | Status: DC | PRN
  Administered 2018-04-30: 5 mg via INTRAVENOUS
  Filled 2018-04-29: qty 1

## 2018-04-29 MED ORDER — PANTOPRAZOLE SODIUM 40 MG PO TBEC
40.0000 mg | DELAYED_RELEASE_TABLET | Freq: Every day | ORAL | Status: DC
  Administered 2018-04-30 – 2018-05-01 (×2): 40 mg via ORAL
  Filled 2018-04-29 (×2): qty 1

## 2018-04-29 MED ORDER — MORPHINE SULFATE (PF) 4 MG/ML IV SOLN
3.0000 mg | INTRAVENOUS | Status: DC | PRN
  Administered 2018-04-30 (×3): 3 mg via INTRAVENOUS
  Filled 2018-04-29 (×3): qty 1

## 2018-04-29 MED ORDER — SODIUM CHLORIDE 0.9 % IV SOLN
1.0000 g | Freq: Once | INTRAVENOUS | Status: AC
  Administered 2018-04-29: 1 g via INTRAVENOUS
  Filled 2018-04-29: qty 10

## 2018-04-29 MED ORDER — SODIUM CHLORIDE 0.9 % IV SOLN
1.0000 g | Freq: Once | INTRAVENOUS | Status: DC
  Filled 2018-04-29: qty 10

## 2018-04-29 NOTE — ED Notes (Signed)
Pt placed on Purewick to obtain urine sample   

## 2018-04-29 NOTE — ED Notes (Signed)
Attempted to give report. Rn will call right back

## 2018-04-29 NOTE — ED Notes (Signed)
Patient transported to CT 

## 2018-04-29 NOTE — ED Notes (Signed)
ED TO INPATIENT HANDOFF REPORT  Name/Age/Gender Alexandra Booth 84 y.o. female  Code Status    Code Status Orders  (From admission, onward)         Start     Ordered   04/29/18 2240  Do not attempt resuscitation (DNR)  Continuous    Question Answer Comment  In the event of cardiac or respiratory ARREST Do not call a "code blue"   In the event of cardiac or respiratory ARREST Do not perform Intubation, CPR, defibrillation or ACLS   In the event of cardiac or respiratory ARREST Use medication by any route, position, wound care, and other measures to relive pain and suffering. May use oxygen, suction and manual treatment of airway obstruction as needed for comfort.      04/29/18 2241        Code Status History    Date Active Date Inactive Code Status Order ID Comments User Context   04/26/2017 0157 04/28/2017 2304 DNR [id number]  Shela Leff, MD Inpatient   12/18/2016 1930 12/21/2016 1859 DNR [id number]  Ivor Costa, MD ED   09/12/2014 2117 09/19/2014 2038 Full Code [id number]  Leandrew Koyanagi, MD Inpatient   09/11/2014 1544 09/12/2014 2117 Full Code [id number]  Juluis Mire, MD Inpatient    Advance Directive Documentation     Most Recent Value  Type of Advance Directive  Out of facility DNR (pink MOST or yellow form)  Pre-existing out of facility DNR order (yellow form or pink MOST form)  Yellow form placed in chart (order not valid for inpatient use)  "MOST" Form in Place?  -      Home/SNF/Other Home  Chief Complaint fall  Level of Care/Admitting Diagnosis ED Disposition    ED Disposition Condition [place]: [facility] [100102]  Level of Care: Med-Surg [16]  Diagnosis: Closed fracture of greater trochanter of right femur [facility]) [[id number]]  Admitting Physician: Vianne Bulls [[id number]]  Attending Physician: Vianne Bulls [[id number]]  PT Class (Do Not Modify): Observation [104]  PT Acc Code (Do Not Modify): Observation [10022]        Medical History Past Medical History:  Diagnosis Date  . Dementia ([place])   . Depression with anxiety   . Diabetes mellitus without complication ([place])   . GERD (gastroesophageal reflux disease)   . Hypothyroidism     Allergies Allergies  Allergen Reactions  . Diazepam     REACTION: Unknown reaction  . Penicillins     REACTION: rash, itching Has patient had a PCN reaction causing immediate rash, facial/tongue/throat swelling, SOB or lightheadedness with hypotension: Yes Has patient had a PCN reaction causing severe rash involving mucus membranes or skin necrosis: Unknown Has patient had a PCN reaction that required hospitalization: Unknown Has patient had a PCN reaction occurring within the last 10 years: Unknown If all of the above answers are "NO", then may proceed with Cephalosporin use.   . Sulfonamide Derivatives     REACTION: GI upset    IV Location/Drains/Wounds Patient Lines/Drains/Airways Status   Active Line/Drains/Airways    Name:   Placement date:   Placement time:   Site:   Days:   Peripheral IV 04/29/18 Left Antecubital   04/29/18    2126    Antecubital   less than 1   External Urinary Catheter   12/21/16    0532    -   494   External Urinary Catheter   04/25/17  1837    -   369   Incision (Closed) 09/12/14 Hip Left   09/12/14    1750     1325   Incision (Closed) 12/19/16 Leg Left   12/19/16    1724     496   Incision (Closed) 12/19/16 Leg Left   12/19/16    1724     496   Incision (Closed) 12/19/16 Leg Left   12/19/16    1724     496   Incision (Closed) 12/19/16 Leg   12/19/16    1726     496          Labs/Imaging Results for orders placed or performed during the hospital encounter of 04/29/18 (from the past 48 hour(s))  Comprehensive metabolic panel     Status: Abnormal   Collection Time: 04/29/18  8:31 PM  Result Value Ref Range   Sodium 142 135 - 145 mmol/L   Potassium 4.5 3.5 - 5.1 mmol/L   Chloride 108 98 - 111 mmol/L   CO2 26 22 - 32  mmol/L   Glucose, Bld 111 (H) 70 - 99 mg/dL   BUN 38 (H) 8 - 23 mg/dL   Creatinine, Ser 2.14 (H) 0.44 - 1.00 mg/dL   Calcium 9.3 8.9 - 10.3 mg/dL   Total Protein 7.1 6.5 - 8.1 g/dL   Albumin 3.2 (L) 3.5 - 5.0 g/dL   AST 27 15 - 41 U/L   ALT 20 0 - 44 U/L   Alkaline Phosphatase 87 38 - 126 U/L   Total Bilirubin 0.5 0.3 - 1.2 mg/dL   GFR calc non Af Amer 19 (L) >60 mL/min   GFR calc Af Amer 22 (L) >60 mL/min    Comment: (NOTE) The eGFR has been calculated using the CKD EPI equation. This calculation has not been validated in all clinical situations. eGFR's persistently <60 mL/min signify possible Chronic Kidney Disease.    Anion gap 8 5 - 15    Comment: Performed at [facility], [place] [address], [place], [place] [address]  CBC with Differential     Status: Abnormal   Collection Time: 04/29/18  8:31 PM  Result Value Ref Range   WBC 9.3 4.0 - 10.5 K/uL   RBC 3.03 (L) 3.87 - 5.11 MIL/uL   Hemoglobin 10.0 (L) 12.0 - 15.0 g/dL   HCT 32.9 (L) 36.0 - 46.0 %   MCV 108.6 (H) 80.0 - 100.0 fL   MCH 33.0 26.0 - 34.0 pg   MCHC 30.4 30.0 - 36.0 g/dL   RDW 11.9 11.5 - 15.5 %   Platelets 199 150 - 400 K/uL   nRBC 0.0 0.0 - 0.2 %   Neutrophils Relative % 70 %   Neutro Abs 6.5 1.7 - 7.7 K/uL   Lymphocytes Relative 19 %   Lymphs Abs 1.8 0.7 - 4.0 K/uL   Monocytes Relative 8 %   Monocytes Absolute 0.8 0.1 - 1.0 K/uL   Eosinophils Relative 2 %   Eosinophils Absolute 0.2 0.0 - 0.5 K/uL   Basophils Relative 0 %   Basophils Absolute 0.0 0.0 - 0.1 K/uL   Immature Granulocytes 1 %   Abs Immature Granulocytes 0.07 0.00 - 0.07 K/uL    Comment: Performed at [facility], [place] [address], [place], [place] [address]  Urinalysis, Routine w reflex microscopic     Status: Abnormal   Collection Time: 04/29/18  9:25 PM  Result Value Ref Range   Color, Urine  YELLOW YELLOW   APPearance HAZY (A) CLEAR   Specific Gravity, Urine 1.012 1.005 - 1.030   pH 7.0 5.0 - 8.0    Glucose, UA NEGATIVE NEGATIVE mg/dL   Hgb urine dipstick SMALL (A) NEGATIVE   Bilirubin Urine NEGATIVE NEGATIVE   Ketones, ur NEGATIVE NEGATIVE mg/dL   Protein, ur 100 (A) NEGATIVE mg/dL   Nitrite NEGATIVE NEGATIVE   Leukocytes, UA LARGE (A) NEGATIVE   RBC / HPF 0-5 0 - 5 RBC/hpf   WBC, UA 21-50 0 - 5 WBC/hpf   Bacteria, UA MANY (A) NONE SEEN   Squamous Epithelial / LPF 0-5 0 - 5    Comment: Performed at [facility], [place] [address] [place], [place] [address]   Dg Lumbar Spine Complete  Result Date: 04/29/2018 CLINICAL DATA:  Bilateral leg pain and abdominal pain. Prior fall. EXAM: LUMBAR SPINE - COMPLETE 4+ VIEW COMPARISON:  CT abdomen 12/07/2017 FINDINGS: Prominent bony demineralization with resulting ill definition of some of the visualized cortex, for example along the upper sacrum and iliac bones. Levoconvex lumbar scoliosis. Dense atherosclerotic calcification particularly of the aortoiliac tree. Grade 2 anterolisthesis at L4-5 with loss of disc height as shown on the 12/07/2017 exam. Patient has had prior laminectomy at L4-5. IMPRESSION: 1. No acute lumbar spine findings. Grade 2 anterolisthesis at L4-5 is chronic. 2. Significantly reduced sensitivity due to bony demineralization causing poor definition of cortical margins. If there is a high clinical index of suspicion of occult fracture, lumbar spine CT or MRI would be recommended. 3. Levoconvex lumbar scoliosis. 4. Lower lumbar spondylosis and degenerative disc disease. Postoperative findings at L4-5. 5. Atherosclerosis. Electronically Signed   By: Van Clines M.D.   On: 04/29/2018 20:02   Dg Pelvis 1-2 Views  Result Date: 04/29/2018 CLINICAL DATA:  Bilateral leg pain, abdominal pain status post fall EXAM: PELVIS - 1-2 VIEW COMPARISON:  None. FINDINGS: Generalized osteopenia. Slight cortical irregularity along the peripheral aspect of the right greater trochanter which may be projectional versus a isolated  nondisplaced right greater trochanteric fracture. No other fracture or dislocation. Prior ORIF of a right femoral neck fracture. Mild osteoarthritis of bilateral SI joints. IMPRESSION: Slight cortical irregularity along the peripheral aspect of the right greater trochanter which may be projectional versus a isolated nondisplaced right greater trochanteric fracture. Correlate with point tenderness. Electronically Signed   By: Kathreen Devoid   On: 04/29/2018 20:13   Ct Lumbar Spine Wo Contrast  Result Date: 04/29/2018 CLINICAL DATA:  Fall, low back pain.  Follow-up abnormal radiograph. EXAM: CT LUMBAR SPINE WITHOUT CONTRAST TECHNIQUE: Multidetector CT imaging of the lumbar spine was performed without intravenous contrast administration. Multiplanar CT image reconstructions were also generated. COMPARISON:  Lumbar spine radiographs April 29, 2018 FINDINGS: SEGMENTATION: For the purposes of this report the last well-formed intervertebral disc space is reported as L5-S1. ALIGNMENT: Maintained lumbar lordosis. Grade 2 L4-5 anterolisthesis. Mild broad levoscoliosis. VERTEBRAE: Vertebral bodies intact. Severe L4-5 disc height loss with vacuum disc, mild at L5-S1 compatible with degenerative discs. Remaining disc heights preserved. Severe osteopenia without destructive bony lesions. No destructive bony lesions. Localizer densities LEFT hip pinning. PARASPINAL AND OTHER SOFT TISSUES: Nonacute. Small hiatal hernia. Status post cholecystectomy. Severe calcific atherosclerosis aortoiliac vessels. Colonic diverticulosis. DISC LEVELS: T12-L1, L1-2 No disc bulge, canal stenosis nor neural foraminal narrowing. Moderate L1-2 facet arthropathy. L2-3: Small broad-based disc osteophyte complex asymmetric to LEFT. Moderate facet arthropathy and ligamentum flavum redundancy without canal stenosis. Minimal LEFT neural foraminal narrowing. L3-4:  Moderate broad-based disc osteophyte complex. Posterior decompression with moderate to  severe facet arthropathy. No canal stenosis. Severe bilateral neural foraminal narrowing. L4-5: Anterolisthesis. RIGHT hemilaminectomy. Severe facet arthropathy without canal stenosis. Severe bilateral neural foraminal narrowing. L5-S1: Small broad-based disc osteophyte complex. Severe RIGHT and moderate LEFT facet arthropathy. No canal stenosis. Moderate LEFT neural foraminal narrowing. IMPRESSION: 1. No fracture though is sensitivity decreased by severe osteopenia. 2. Grade 2 L4-5 anterolisthesis, status post laminectomy. L3-4 posterior decompression. 3. No canal stenosis. Neural foraminal narrowing L2-3 through L5-S1: Severe at L3-4 and L4-5. Electronically Signed   By: Elon Alas M.D.   On: 04/29/2018 21:35   Ct Hip Right Wo Contrast  Result Date: 04/29/2018 CLINICAL DATA:  Patient fell has right hip and low back pain. EXAM: CT OF THE RIGHT HIP WITHOUT CONTRAST TECHNIQUE: Multidetector CT imaging of the right hip was performed according to the standard protocol. Multiplanar CT image reconstructions were also generated. COMPARISON:  Pelvic radiograph 04/29/2018 FINDINGS: Bones/Joint/Cartilage Nondisplaced incomplete fractures of the base and periphery of the right greater trochanter, series 7/40 and 41 are identified. Subacute to remote inferior sacral alar fracture with sclerosis is also noted, series 4/1. The acetabulum and pubic rami as well as pubic symphysis appear intact. No femoral neck fracture is identified. Ligaments Suboptimally assessed by CT. Muscles and Tendons Mild generalized muscular atrophy about the right hip. No intramuscular hemorrhage or hematoma. Soft tissues No subcutaneous hematoma. Mild soft tissue induration along the lateral aspect of the right hip. Trace right hip joint effusion. IMPRESSION: 1. Incomplete acute appearing fractures at the base and periphery of the right greater trochanter. No avulsion is noted. 2. No femoral neck fracture or joint dislocation. 3. Remote  appearing right inferior sacral alar fracture. Electronically Signed   By: Ashley Royalty M.D.   On: 04/29/2018 21:39    Pending Labs Unresulted Labs (From admission, onward)    Start     Ordered   04/30/18 9357  Basic metabolic panel  Tomorrow morning,   R     04/29/18 2241   04/30/18 0500  CBC WITH DIFFERENTIAL  Tomorrow morning,   R     04/29/18 2241   04/29/18 2217  Urine culture  ONCE - STAT,   STAT     04/29/18 2217          Vitals/Pain Today's Vitals   04/29/18 2124 04/29/18 2125 04/29/18 2200 04/29/18 2211  BP: (!) 203/73  (!) 196/72   Pulse: (!) 59  81   Resp: 20  16   SpO2: 93%  (!) 89%   PainSc:  10-Worst pain ever  10-Worst pain ever    Isolation Precautions No active isolations  Medications Medications  cefTRIAXone (ROCEPHIN) 1 g in sodium chloride 0.9 % 100 mL IVPB (has no administration in time range)  donepezil (ARICEPT) tablet 10 mg (has no administration in time range)  escitalopram (LEXAPRO) tablet 20 mg (has no administration in time range)  LORazepam (ATIVAN) tablet 0.25 mg (has no administration in time range)  memantine (NAMENDA) tablet 10 mg (has no administration in time range)  traZODone (DESYREL) tablet 50 mg (has no administration in time range)  levothyroxine (SYNTHROID, LEVOTHROID) tablet 25 mcg (has no administration in time range)  pantoprazole (PROTONIX) EC tablet 40 mg (has no administration in time range)  senna (SENOKOT) tablet 8.6 mg (has no administration in time range)  darifenacin (ENABLEX) 24 hr tablet 7.5 mg (has no administration in time range)  feeding supplement (ENSURE ENLIVE) (  ENSURE ENLIVE) liquid 237 mL (has no administration in time range)  Polyethyl Glycol-Propyl Glycol 0.4-0.3 % SOLN 1 drop (has no administration in time range)  heparin injection 5,000 Units (has no administration in time range)  sodium chloride flush (NS) 0.9 % injection 3 mL (has no administration in time range)  sodium chloride flush (NS) 0.9 %  injection 3 mL (has no administration in time range)  0.9 %  sodium chloride infusion (has no administration in time range)  acetaminophen (TYLENOL) tablet 650 mg (has no administration in time range)    Or  acetaminophen (TYLENOL) suppository 650 mg (has no administration in time range)  HYDROcodone-acetaminophen (NORCO/VICODIN) 5-325 MG per tablet 1 tablet (has no administration in time range)  polyethylene glycol (MIRALAX / GLYCOLAX) packet 17 g (has no administration in time range)  bisacodyl (DULCOLAX) EC tablet 5 mg (has no administration in time range)  ondansetron (ZOFRAN) tablet 4 mg (has no administration in time range)    Or  ondansetron (ZOFRAN) injection 4 mg (has no administration in time range)  morphine 4 MG/ML injection 3 mg (has no administration in time range)  HYDROcodone-acetaminophen (NORCO/VICODIN) 5-325 MG per tablet 1 tablet (1 tablet Oral Given 04/29/18 1731)  morphine 4 MG/ML injection 4 mg (4 mg Intramuscular Given 04/29/18 1858)  morphine 4 MG/ML injection 4 mg (4 mg Intravenous Given 04/29/18 2127)    Mobility non-ambulatory

## 2018-04-29 NOTE — H&P (Signed)
History and Physical    Alexandra Booth VQM:[id number] DOB: 19-Dec-1923 DOA: 04/29/2018  PCP: Janifer Adie, MD   Patient coming from: SNF   Chief Complaint: Fall with low back and buttock pain, suprapubic pain   HPI: Alexandra Booth is a 84 y.o. female with medical history significant for dementia, depression, anxiety, hypothyroidism, chronic kidney disease stage IV, and chronic microcytic anemia, now presenting from her SNF for evaluation of severe pain in the low back and buttock after a fall.  Patient reports that she was reaching for a call bell when she fell, landing on her left side without hitting her head or losing consciousness.  She reports experiencing severe pain in the low back and buttock after the fall.  She also reports some suprapubic pain but is unable to say when this began.  She reports being in her usual state prior to the fall and denies fevers, chills, chest pain, or shortness of breath, but remarks "I am 84 years old; I don't really know."    ED Course: Upon arrival to the ED, patient is found to be hypertensive with SBP 200.  Chemistry panel is notable for a creatinine of 2.14, up from 1.99 in June.  CBC features a stable chronic microcytic anemia with hemoglobin 10.0.  Urinalysis is suggestive of infection and sample was sent for culture.  Radiographs of the hips, low back, and pelvis are limited by bony demineralization.  CT of the pelvis is concerning for incomplete acute fracture of the right greater trochanter.  CT lumbar spine without evidence for acute fracture or canal stenosis.  Orthopedic surgery was consulted by the ED physician.  Patient was treated with Rocephin, Norco, IM morphine, and IV morphine in the ED.  She continues to complain of severe pain and will be observed for ongoing evaluation and management.  Review of Systems:  All other systems reviewed and apart from HPI, are negative.  Past Medical History:  Diagnosis Date  . Dementia ([place])   .  Depression with anxiety   . Diabetes mellitus without complication ([place])   . GERD (gastroesophageal reflux disease)   . Hypothyroidism     Past Surgical History:  Procedure Laterality Date  . FEMUR IM NAIL Left 09/12/2014   Procedure: INTRAMEDULLARY (IM) NAIL FEMORAL;  Surgeon: Leandrew Koyanagi, MD;  Location: [facility];  Service: Orthopedics;  Laterality: Left;  . LAMINECTOMY    . TIBIA IM NAIL INSERTION Left 12/19/2016   Procedure: INTRAMEDULLARY (IM) NAIL TIBIAL;  Surgeon: Leandrew Koyanagi, MD;  Location: [facility];  Service: Orthopedics;  Laterality: Left;     reports that she has quit smoking. She has never used smokeless tobacco. She reports that she does not drink alcohol or use drugs.  Allergies  Allergen Reactions  . Diazepam     REACTION: Unknown reaction  . Penicillins     REACTION: rash, itching Has patient had a PCN reaction causing immediate rash, facial/tongue/throat swelling, SOB or lightheadedness with hypotension: Yes Has patient had a PCN reaction causing severe rash involving mucus membranes or skin necrosis: Unknown Has patient had a PCN reaction that required hospitalization: Unknown Has patient had a PCN reaction occurring within the last 10 years: Unknown If all of the above answers are "NO", then may proceed with Cephalosporin use.   . Sulfonamide Derivatives     REACTION: GI upset    Family History  Problem Relation Age of Onset  . Kidney disease Mother   . Kidney disease  Father      Prior to Admission medications   Medication Sig Start Date End Date Taking? Authorizing Provider  acetaminophen (TYLENOL) 650 MG CR tablet Take 650 mg 2 (two) times daily by mouth.   Yes [provider]  calcium carbonate (OS-CAL - DOSED IN MG OF ELEMENTAL CALCIUM) 1250 (500 CA) MG tablet Take 1 tablet (500 mg of elemental calcium total) by mouth daily with breakfast. 09/19/14  Yes Rabbani, Marjan, MD  Cholecalciferol 1000 UNITS capsule Take 1,000 Units by mouth daily.   Yes  [provider]  donepezil (ARICEPT) 10 MG tablet Take 10 mg by mouth at bedtime.   Yes [provider]  escitalopram (LEXAPRO) 20 MG tablet Take 20 mg by mouth daily.    Yes [provider]  feeding supplement, ENSURE ENLIVE, (ENSURE ENLIVE) LIQD Take 237 mLs by mouth 2 (two) times daily between meals. 09/19/14  Yes Rabbani, Ricarda Frame, MD  furosemide (LASIX) 20 MG tablet Take 20 mg by mouth.   Yes [provider]  iron polysaccharides (NIFEREX) 150 MG capsule Take 150 mg 2 (two) times daily by mouth.   Yes [provider]  levothyroxine (SYNTHROID, LEVOTHROID) 25 MCG tablet Take 25 mcg by mouth daily before breakfast.   Yes [provider]  LORazepam (ATIVAN) 0.5 MG tablet Take 0.125 mg by mouth 2 (two) times daily.   Yes [provider]  memantine (NAMENDA) 10 MG tablet Take 10 mg by mouth 2 (two) times daily.   Yes [provider]  Multiple Vitamins-Minerals (MULTIVITAMIN WITH MINERALS) tablet Take 1 tablet by mouth daily.   Yes [provider]  pantoprazole (PROTONIX) 40 MG tablet Take 40 mg by mouth daily.   Yes [provider]  Polyethyl Glycol-Propyl Glycol (SYSTANE) 0.4-0.3 % SOLN Apply 1 drop to eye 2 (two) times daily as needed (dry eyes).   Yes [provider]  traZODone (DESYREL) 50 MG tablet Take 50 mg by mouth at bedtime.   Yes [provider]  Menthol, Topical Analgesic, (BIOFREEZE EX) Apply 1 application topically 3 (three) times daily. 3% Apply to knees    [provider]  nystatin (NYSTATIN) powder Apply 1 Bottle topically 3 (three) times daily as needed (skin irritation).    [provider]  omeprazole (PRILOSEC) 40 MG capsule Take 40 mg by mouth 2 (two) times daily.    [provider]  phenazopyridine (PYRIDIUM) 200 MG tablet Take 200 mg by mouth daily as needed for pain (painful urination).    [provider]  senna (SENOKOT) 8.6 MG tablet  Take 1 tablet by mouth 2 (two) times daily.    [provider]  solifenacin (VESICARE) 5 MG tablet Take 5 mg by mouth daily.    [provider]    Physical Exam: Vitals:   04/29/18 1611 04/29/18 1903 04/29/18 2124 04/29/18 2200  BP: (!) 134/104 (!) 201/82 (!) 203/73 (!) 196/72  Pulse: 70 75 (!) 59 81  Resp: 17 17 20 16   SpO2: 96% 98% 93% (!) 89%    Constitutional: NAD, appears uncomfortable  Eyes: PERTLA, lids and conjunctivae normal ENMT: Mucous membranes are moist. Posterior pharynx clear of any exudate or lesions.   Neck: normal, supple, no masses, no thyromegaly Respiratory: clear to auscultation bilaterally, no wheezing, no crackles. Normal respiratory effort.    Cardiovascular: S1 & S2 heard, regular rate and rhythm. Mild non-pitting BLE edema.   Abdomen: No distension, soft, suprapubic tenderness. Bowel sounds normal.  Musculoskeletal: no  clubbing / cyanosis. No joint deformity upper and lower extremities.    Skin: no significant rashes, lesions, ulcers. Warm, dry, well-perfused. Neurologic: No gross facial asymmetry. Sensation intact. Moving all extremities.  Psychiatric: Alert and oriented to person, place, and situation, but not oriented to month or year. Calm, cooperative.    Labs on Admission: I have personally reviewed following labs and imaging studies  CBC: Recent Labs  Lab 04/29/18 2031  WBC 9.3  NEUTROABS 6.5  HGB 10.0*  HCT 32.9*  MCV 108.6*  PLT 563   Basic Metabolic Panel: Recent Labs  Lab 04/29/18 2031  NA 142  K 4.5  CL 108  CO2 26  GLUCOSE 111*  BUN 38*  CREATININE 2.14*  CALCIUM 9.3   GFR: CrCl cannot be calculated (Unknown ideal weight.). Liver Function Tests: Recent Labs  Lab 04/29/18 2031  AST 27  ALT 20  ALKPHOS 87  BILITOT 0.5  PROT 7.1  ALBUMIN 3.2*   No results for input(s): LIPASE, AMYLASE in the last 168 hours. No results for input(s): AMMONIA in the last 168 hours. Coagulation Profile: No results  for input(s): INR, PROTIME in the last 168 hours. Cardiac Enzymes: No results for input(s): CKTOTAL, CKMB, CKMBINDEX, TROPONINI in the last 168 hours. BNP (last 3 results) No results for input(s): PROBNP in the last 8760 hours. HbA1C: No results for input(s): HGBA1C in the last 72 hours. CBG: No results for input(s): GLUCAP in the last 168 hours. Lipid Profile: No results for input(s): CHOL, HDL, LDLCALC, TRIG, CHOLHDL, LDLDIRECT in the last 72 hours. Thyroid Function Tests: No results for input(s): TSH, T4TOTAL, FREET4, T3FREE, THYROIDAB in the last 72 hours. Anemia Panel: No results for input(s): VITAMINB12, FOLATE, FERRITIN, TIBC, IRON, RETICCTPCT in the last 72 hours. Urine analysis:    Component Value Date/Time   COLORURINE YELLOW 04/29/2018 2125   APPEARANCEUR HAZY (A) 04/29/2018 2125   LABSPEC 1.012 04/29/2018 2125   PHURINE 7.0 04/29/2018 2125   GLUCOSEU NEGATIVE 04/29/2018 2125   GLUCOSEU NEG mg/dL 10/17/2008 1921   HGBUR SMALL (A) 04/29/2018 2125   BILIRUBINUR NEGATIVE 04/29/2018 2125   [place] NEGATIVE 04/29/2018 2125   PROTEINUR 100 (A) 04/29/2018 2125   UROBILINOGEN 1.0 09/11/2014 1147   NITRITE NEGATIVE 04/29/2018 2125   LEUKOCYTESUR LARGE (A) 04/29/2018 2125   Sepsis Labs: @LABRCNTIP (procalcitonin:4,lacticidven:4) )No results found for this or any previous visit (from the past 240 hour(s)).   Radiological Exams on Admission: Dg Lumbar Spine Complete  Result Date: 04/29/2018 CLINICAL DATA:  Bilateral leg pain and abdominal pain. Prior fall. EXAM: LUMBAR SPINE - COMPLETE 4+ VIEW COMPARISON:  CT abdomen 12/07/2017 FINDINGS: Prominent bony demineralization with resulting ill definition of some of the visualized cortex, for example along the upper sacrum and iliac bones. Levoconvex lumbar scoliosis. Dense atherosclerotic calcification particularly of the aortoiliac tree. Grade 2 anterolisthesis at L4-5 with loss of disc height as shown on the 12/07/2017 exam.  Patient has had prior laminectomy at L4-5. IMPRESSION: 1. No acute lumbar spine findings. Grade 2 anterolisthesis at L4-5 is chronic. 2. Significantly reduced sensitivity due to bony demineralization causing poor definition of cortical margins. If there is a high clinical index of suspicion of occult fracture, lumbar spine CT or MRI would be recommended. 3. Levoconvex lumbar scoliosis. 4. Lower lumbar spondylosis and degenerative disc disease. Postoperative findings at L4-5. 5. Atherosclerosis. Electronically Signed   By: Van Clines M.D.   On: 04/29/2018 20:02   Dg Pelvis 1-2 Views  Result Date: 04/29/2018 CLINICAL DATA:  Bilateral leg pain, abdominal pain status post fall EXAM: PELVIS - 1-2 VIEW COMPARISON:  None. FINDINGS: Generalized osteopenia. Slight cortical irregularity along the peripheral aspect of the right greater trochanter which may be projectional versus a isolated nondisplaced right greater trochanteric fracture. No other fracture or dislocation. Prior ORIF of a right femoral neck fracture. Mild osteoarthritis of bilateral SI joints. IMPRESSION: Slight cortical irregularity along the peripheral aspect of the right greater trochanter which may be projectional versus a isolated nondisplaced right greater trochanteric fracture. Correlate with point tenderness. Electronically Signed   By: Kathreen Devoid   On: 04/29/2018 20:13   Ct Lumbar Spine Wo Contrast  Result Date: 04/29/2018 CLINICAL DATA:  Fall, low back pain.  Follow-up abnormal radiograph. EXAM: CT LUMBAR SPINE WITHOUT CONTRAST TECHNIQUE: Multidetector CT imaging of the lumbar spine was performed without intravenous contrast administration. Multiplanar CT image reconstructions were also generated. COMPARISON:  Lumbar spine radiographs April 29, 2018 FINDINGS: SEGMENTATION: For the purposes of this report the last well-formed intervertebral disc space is reported as L5-S1. ALIGNMENT: Maintained lumbar lordosis. Grade 2 L4-5  anterolisthesis. Mild broad levoscoliosis. VERTEBRAE: Vertebral bodies intact. Severe L4-5 disc height loss with vacuum disc, mild at L5-S1 compatible with degenerative discs. Remaining disc heights preserved. Severe osteopenia without destructive bony lesions. No destructive bony lesions. Localizer densities LEFT hip pinning. PARASPINAL AND OTHER SOFT TISSUES: Nonacute. Small hiatal hernia. Status post cholecystectomy. Severe calcific atherosclerosis aortoiliac vessels. Colonic diverticulosis. DISC LEVELS: T12-L1, L1-2 No disc bulge, canal stenosis nor neural foraminal narrowing. Moderate L1-2 facet arthropathy. L2-3: Small broad-based disc osteophyte complex asymmetric to LEFT. Moderate facet arthropathy and ligamentum flavum redundancy without canal stenosis. Minimal LEFT neural foraminal narrowing. L3-4: Moderate broad-based disc osteophyte complex. Posterior decompression with moderate to severe facet arthropathy. No canal stenosis. Severe bilateral neural foraminal narrowing. L4-5: Anterolisthesis. RIGHT hemilaminectomy. Severe facet arthropathy without canal stenosis. Severe bilateral neural foraminal narrowing. L5-S1: Small broad-based disc osteophyte complex. Severe RIGHT and moderate LEFT facet arthropathy. No canal stenosis. Moderate LEFT neural foraminal narrowing. IMPRESSION: 1. No fracture though is sensitivity decreased by severe osteopenia. 2. Grade 2 L4-5 anterolisthesis, status post laminectomy. L3-4 posterior decompression. 3. No canal stenosis. Neural foraminal narrowing L2-3 through L5-S1: Severe at L3-4 and L4-5. Electronically Signed   By: Elon Alas M.D.   On: 04/29/2018 21:35   Ct Hip Right Wo Contrast  Result Date: 04/29/2018 CLINICAL DATA:  Patient fell has right hip and low back pain. EXAM: CT OF THE RIGHT HIP WITHOUT CONTRAST TECHNIQUE: Multidetector CT imaging of the right hip was performed according to the standard protocol. Multiplanar CT image reconstructions were also  generated. COMPARISON:  Pelvic radiograph 04/29/2018 FINDINGS: Bones/Joint/Cartilage Nondisplaced incomplete fractures of the base and periphery of the right greater trochanter, series 7/40 and 41 are identified. Subacute to remote inferior sacral alar fracture with sclerosis is also noted, series 4/1. The acetabulum and pubic rami as well as pubic symphysis appear intact. No femoral neck fracture is identified. Ligaments Suboptimally assessed by CT. Muscles and Tendons Mild generalized muscular atrophy about the right hip. No intramuscular hemorrhage or hematoma. Soft tissues No subcutaneous hematoma. Mild soft tissue induration along the lateral aspect of the right hip. Trace right hip joint effusion. IMPRESSION: 1. Incomplete acute appearing fractures at the base and periphery of the right greater trochanter. No avulsion is noted. 2. No femoral neck fracture or joint dislocation. 3. Remote appearing right inferior sacral alar fracture. Electronically Signed   By: Meredith Leeds.D.  On: 04/29/2018 21:39    EKG: not performed.   Assessment/Plan  1. Right greater trochanter fracture  - Presents from SNF with low back and buttock pain after a fall  - Imaging suggests incomplete acute right greater trochanter fracture  - Pain has been difficult to control in ED  - Orthopedic surgery consulted by ED physician and much appreciated  - Continue supportive care with pain-control, PT eval and tx    2. UTI  - Complains of suprapubic pain and UA is compatible with infection  - Urine sent for culture and empiric Rocephin given in ED  - Continue Rocephin and follow culture and clinical course   3. CKD stage IV  - SCr is 2.14 on admission, slightly higher than priors  - No hyperkalemia, uremia, acidosis, or hypervolemia; suspect HTN is driven by acute pain  - Renally-dose medications    4. Hypertensive urgency  - SBP elevated to 200 in ED in setting of pain  - Continue pain-control, use hydralazine IVP  as-needed    5. Macrocytic anemia  - Hgb is stable at 10.0 on admission without active bleeding   6. Dementia; depression with anxiety; insomnia  - Continue Aricept, Namenda, Lexapro, Ativan, and trazodone    7. Hypothyroidism   - Continue Synthroid    DVT prophylaxis: sq heparin  Code Status: DNR  Family Communication: Discussed with patient  Consults called: Orthopedic surgery consulted by ED physician  Admission status: observation     Vianne Bulls, MD [facility] Pager [phone]  If 7PM-7AM, please contact night-coverage [email or web address] Password [email or web address]  04/29/2018, 10:44 PM

## 2018-04-29 NOTE — ED Provider Notes (Signed)
[facility] DEPT Provider Note   CSN: [id number] Arrival date & time: 04/29/18  1555     History   Chief Complaint Chief Complaint  Patient presents with  . Fall    HPI Janine Reller Husser is a 84 y.o. female.  84 year old female with prior medical history as detailed below presents for evaluation of pain following fall. She arrives from [facility] by EMS. She reports that she was reaching for a call bell and then fell from her bed to the floor. She landed on her left side. She now complains of pain to the low back and buttocks. She denies head injury or LOC. She denies neck pain.   She denies associated chest pain, nausea, shortness of breath, weakness, or other complaint.   The history is provided by medical records and the patient.  Fall  This is a new problem. The current episode started 3 to 5 hours ago. The problem occurs constantly. The problem has not changed since onset.Pertinent negatives include no chest pain, no abdominal pain, no headaches and no shortness of breath. Nothing aggravates the symptoms. Nothing relieves the symptoms. She has tried nothing for the symptoms.    Past Medical History:  Diagnosis Date  . Dementia ([place])   . Depression with anxiety   . Diabetes mellitus without complication ([place])   . GERD (gastroesophageal reflux disease)   . Hypothyroidism     Patient Active Problem List   Diagnosis Date Noted  . Tibial plateau fracture, right 04/25/2017  . Pulmonary mass 04/25/2017  . Tibial plateau fracture 04/25/2017  . Fracture of tibial shaft, left, closed 12/18/2016  . Fracture of left proximal fibula 12/18/2016  . GERD (gastroesophageal reflux disease)   . Depression with anxiety   . Fever 09/14/2014  . Compression fracture of body of thoracic vertebra ([place]) 09/14/2014  . Osteoporosis 09/14/2014  . Irregular heart beats 09/14/2014  . Abnormal chest x-ray   . Acute blood loss anemia   . Macrocytic anemia     . Obesity 09/12/2014  . Fall 09/11/2014  . UTI (lower urinary tract infection) 09/11/2014  . Chronic kidney disease (CKD), stage III (moderate) ([place]) 09/11/2014  . Fracture, proximal femur ([place]) 09/11/2014  . Femur fracture, left ([place]) 09/11/2014  . Opacity of lung on imaging study 09/11/2014  . URINARY RETENTION 10/17/2008  . HYPERTENSION 09/07/2008  . HYPOKALEMIA 07/21/2008  . Dementia ([place]) 07/21/2008  . KNEE PAIN, BILATERAL 05/17/2008  . ACCIDENTAL FALL, HX OF 05/17/2008  . ABDOMINAL PAIN OTHER SPECIFIED SITE 02/05/2008  . DYSPHAGIA UNSPECIFIED 01/13/2008  . Anemia 09/21/2007  . THYROIDECTOMY, HX OF 09/03/2007  . LAMINECTOMY, LUMBAR, HX OF 09/03/2007  . INTERTRIGO, CANDIDAL 06/24/2007  . CARCINOID TUMOR 05/01/2007  . CHEST PAIN 05/01/2007  . HEMORRHOIDS, EXTERNAL 12/23/2006  . 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 Hypothyroidism 04/28/2006  . T2DM (type 2 diabetes mellitus) ([place]) 04/28/2006  . HYPERLIPIDEMIA 04/28/2006  . DEPRESSION 04/28/2006  . Coronary atherosclerosis 04/28/2006  . [place] DISEASE, LUMBAR SPINE 04/28/2006  . LUMBAR RADICULOPATHY 04/28/2006    Past Surgical History:  Procedure Laterality Date  . FEMUR IM NAIL Left 09/12/2014   Procedure: INTRAMEDULLARY (IM) NAIL FEMORAL;  Surgeon: Leandrew Koyanagi, MD;  Location: [facility];  Service: Orthopedics;  Laterality: Left;  . LAMINECTOMY    . TIBIA IM NAIL INSERTION Left 12/19/2016   Procedure: INTRAMEDULLARY (IM) NAIL TIBIAL;  Surgeon: Leandrew Koyanagi, MD;  Location: [facility];  Service: Orthopedics;  Laterality: Left;     OB History   None      Home Medications    Prior to Admission medications   Medication Sig Start Date End Date Taking? Authorizing Provider  acetaminophen (TYLENOL) 650 MG CR tablet Take 650 mg 2 (two) times daily by mouth.   Yes [provider]  calcium carbonate (OS-CAL - DOSED IN MG OF ELEMENTAL CALCIUM) 1250 (500 CA) MG tablet Take 1 tablet (500 mg of  elemental calcium total) by mouth daily with breakfast. 09/19/14  Yes Rabbani, Marjan, MD  Cholecalciferol 1000 UNITS capsule Take 1,000 Units by mouth daily.   Yes [provider]  donepezil (ARICEPT) 10 MG tablet Take 10 mg by mouth at bedtime.   Yes [provider]  escitalopram (LEXAPRO) 20 MG tablet Take 20 mg by mouth daily.    Yes [provider]  feeding supplement, ENSURE ENLIVE, (ENSURE ENLIVE) LIQD Take 237 mLs by mouth 2 (two) times daily between meals. 09/19/14  Yes Rabbani, Ricarda Frame, MD  furosemide (LASIX) 20 MG tablet Take 20 mg by mouth.   Yes [provider]  iron polysaccharides (NIFEREX) 150 MG capsule Take 150 mg 2 (two) times daily by mouth.   Yes [provider]  levothyroxine (SYNTHROID, LEVOTHROID) 25 MCG tablet Take 25 mcg by mouth daily before breakfast.   Yes [provider]  LORazepam (ATIVAN) 0.5 MG tablet Take 0.125 mg by mouth 2 (two) times daily.   Yes [provider]  memantine (NAMENDA) 10 MG tablet Take 10 mg by mouth 2 (two) times daily.   Yes [provider]  Multiple Vitamins-Minerals (MULTIVITAMIN WITH MINERALS) tablet Take 1 tablet by mouth daily.   Yes [provider]  pantoprazole (PROTONIX) 40 MG tablet Take 40 mg by mouth daily.   Yes [provider]  Polyethyl Glycol-Propyl Glycol (SYSTANE) 0.4-0.3 % SOLN Apply 1 drop to eye 2 (two) times daily as needed (dry eyes).   Yes [provider]  traZODone (DESYREL) 50 MG tablet Take 50 mg by mouth at bedtime.   Yes [provider]  ALPRAZolam (XANAX) 0.5 MG tablet Take 1 tablet (0.5 mg total) by mouth at bedtime. Patient not taking: Reported on 04/29/2018 12/21/16   Oswald Hillock, MD  aspirin 81 MG chewable tablet Chew 1 tablet (81 mg total) by mouth daily. Patient not taking: Reported on 04/29/2018 12/22/16   Oswald Hillock, MD  docusate sodium (COLACE) 100 MG capsule Take 1 capsule (100 mg total) by mouth 2  (two) times daily as needed for moderate constipation. Patient not taking: Reported on 04/29/2018 12/07/17   Hayden Rasmussen, MD  enoxaparin (LOVENOX) 30 MG/0.3ML injection Inject 0.3 mLs (30 mg total) into the skin daily. Patient not taking: Reported on 04/29/2018 12/19/16   Leandrew Koyanagi, MD  HYDROcodone-acetaminophen (NORCO) 5-325 MG tablet Take 1-2 tablets by mouth every 6 (six) hours as needed. Patient not taking: Reported on 04/29/2018 12/19/16   Leandrew Koyanagi, MD  Menthol, Topical Analgesic, (BIOFREEZE EX) Apply 1 application topically 3 (three) times daily. 3% Apply to knees    [provider]  nystatin (NYSTATIN) powder Apply 1 Bottle topically 3 (three) times daily as needed (skin irritation).    [provider]  omeprazole (PRILOSEC) 40 MG capsule Take 40 mg by mouth 2 (two) times daily.    [provider]  phenazopyridine (PYRIDIUM) 200 MG tablet Take 200 mg by mouth daily as needed for pain (  painful urination).    [provider]  polyethylene glycol (MIRALAX / GLYCOLAX) packet Take 17 g by mouth daily as needed for mild constipation. Patient not taking: Reported on 04/29/2018 12/21/16   Oswald Hillock, MD  senna (SENOKOT) 8.6 MG tablet Take 1 tablet by mouth 2 (two) times daily.    [provider]  solifenacin (VESICARE) 5 MG tablet Take 5 mg by mouth daily.    [provider]    Family History Family History  Problem Relation Age of Onset  . Kidney disease Mother   . Kidney disease Father     Social History Social History   Tobacco Use  . Smoking status: Former Research scientist (life sciences)  . Smokeless tobacco: Never Used  Substance Use Topics  . Alcohol use: No    Alcohol/week: 0.0 standard drinks  . Drug use: No     Allergies   Diazepam; Penicillins; and Sulfonamide derivatives   Review of Systems Review of Systems  Respiratory: Negative for shortness of breath.   Cardiovascular: Negative for chest pain.  Gastrointestinal:  Negative for abdominal pain.  Neurological: Negative for headaches.  All other systems reviewed and are negative.    Physical Exam Updated Vital Signs BP (!) 196/72   Pulse 81   Resp 16   SpO2 (!) 89%   Physical Exam  Constitutional: She is oriented to person, place, and time. She appears well-developed and well-nourished. No distress.  HENT:  Head: Normocephalic and atraumatic.  Mouth/Throat: Oropharynx is clear and moist.  Eyes: Pupils are equal, round, and reactive to light. Conjunctivae and EOM are normal.  Neck: Normal range of motion. Neck supple.  Cardiovascular: Normal rate, regular rhythm and normal heart sounds.  Pulmonary/Chest: Effort normal and breath sounds normal. No respiratory distress.  Abdominal: Soft. She exhibits no distension. There is no tenderness.  Musculoskeletal: Normal range of motion. She exhibits no edema or deformity.  Diffuse tenderness to the low back and bilateral hips   No step off, no crepitus noted   5/5 strength to distal BLE   Neurological: She is alert and oriented to person, place, and time.  Skin: Skin is warm and dry.  Psychiatric: She has a normal mood and affect.  Nursing note and vitals reviewed.    ED Treatments / Results  Labs (all labs ordered are listed, but only abnormal results are displayed) Labs Reviewed  COMPREHENSIVE METABOLIC PANEL - Abnormal; Notable for the following components:      Result Value   Glucose, Bld 111 (*)    BUN 38 (*)    Creatinine, Ser 2.14 (*)    Albumin 3.2 (*)    GFR calc non Af Amer 19 (*)    GFR calc Af Amer 22 (*)    All other components within normal limits  CBC WITH DIFFERENTIAL/PLATELET - Abnormal; Notable for the following components:   RBC 3.03 (*)    Hemoglobin 10.0 (*)    HCT 32.9 (*)    MCV 108.6 (*)    All other components within normal limits  URINALYSIS, ROUTINE W REFLEX MICROSCOPIC - Abnormal; Notable for the following components:   APPearance HAZY (*)    Hgb urine  dipstick SMALL (*)    Protein, ur 100 (*)    Leukocytes, UA LARGE (*)    Bacteria, UA MANY (*)    All other components within normal limits  URINE CULTURE  BASIC METABOLIC PANEL  CBC WITH DIFFERENTIAL/PLATELET    EKG None  Radiology Dg Lumbar  Spine Complete  Result Date: 04/29/2018 CLINICAL DATA:  Bilateral leg pain and abdominal pain. Prior fall. EXAM: LUMBAR SPINE - COMPLETE 4+ VIEW COMPARISON:  CT abdomen 12/07/2017 FINDINGS: Prominent bony demineralization with resulting ill definition of some of the visualized cortex, for example along the upper sacrum and iliac bones. Levoconvex lumbar scoliosis. Dense atherosclerotic calcification particularly of the aortoiliac tree. Grade 2 anterolisthesis at L4-5 with loss of disc height as shown on the 12/07/2017 exam. Patient has had prior laminectomy at L4-5. IMPRESSION: 1. No acute lumbar spine findings. Grade 2 anterolisthesis at L4-5 is chronic. 2. Significantly reduced sensitivity due to bony demineralization causing poor definition of cortical margins. If there is a high clinical index of suspicion of occult fracture, lumbar spine CT or MRI would be recommended. 3. Levoconvex lumbar scoliosis. 4. Lower lumbar spondylosis and degenerative disc disease. Postoperative findings at L4-5. 5. Atherosclerosis. Electronically Signed   By: Van Clines M.D.   On: 04/29/2018 20:02   Dg Pelvis 1-2 Views  Result Date: 04/29/2018 CLINICAL DATA:  Bilateral leg pain, abdominal pain status post fall EXAM: PELVIS - 1-2 VIEW COMPARISON:  None. FINDINGS: Generalized osteopenia. Slight cortical irregularity along the peripheral aspect of the right greater trochanter which may be projectional versus a isolated nondisplaced right greater trochanteric fracture. No other fracture or dislocation. Prior ORIF of a right femoral neck fracture. Mild osteoarthritis of bilateral SI joints. IMPRESSION: Slight cortical irregularity along the peripheral aspect of the right  greater trochanter which may be projectional versus a isolated nondisplaced right greater trochanteric fracture. Correlate with point tenderness. Electronically Signed   By: Kathreen Devoid   On: 04/29/2018 20:13   Ct Lumbar Spine Wo Contrast  Result Date: 04/29/2018 CLINICAL DATA:  Fall, low back pain.  Follow-up abnormal radiograph. EXAM: CT LUMBAR SPINE WITHOUT CONTRAST TECHNIQUE: Multidetector CT imaging of the lumbar spine was performed without intravenous contrast administration. Multiplanar CT image reconstructions were also generated. COMPARISON:  Lumbar spine radiographs April 29, 2018 FINDINGS: SEGMENTATION: For the purposes of this report the last well-formed intervertebral disc space is reported as L5-S1. ALIGNMENT: Maintained lumbar lordosis. Grade 2 L4-5 anterolisthesis. Mild broad levoscoliosis. VERTEBRAE: Vertebral bodies intact. Severe L4-5 disc height loss with vacuum disc, mild at L5-S1 compatible with degenerative discs. Remaining disc heights preserved. Severe osteopenia without destructive bony lesions. No destructive bony lesions. Localizer densities LEFT hip pinning. PARASPINAL AND OTHER SOFT TISSUES: Nonacute. Small hiatal hernia. Status post cholecystectomy. Severe calcific atherosclerosis aortoiliac vessels. Colonic diverticulosis. DISC LEVELS: T12-L1, L1-2 No disc bulge, canal stenosis nor neural foraminal narrowing. Moderate L1-2 facet arthropathy. L2-3: Small broad-based disc osteophyte complex asymmetric to LEFT. Moderate facet arthropathy and ligamentum flavum redundancy without canal stenosis. Minimal LEFT neural foraminal narrowing. L3-4: Moderate broad-based disc osteophyte complex. Posterior decompression with moderate to severe facet arthropathy. No canal stenosis. Severe bilateral neural foraminal narrowing. L4-5: Anterolisthesis. RIGHT hemilaminectomy. Severe facet arthropathy without canal stenosis. Severe bilateral neural foraminal narrowing. L5-S1: Small broad-based  disc osteophyte complex. Severe RIGHT and moderate LEFT facet arthropathy. No canal stenosis. Moderate LEFT neural foraminal narrowing. IMPRESSION: 1. No fracture though is sensitivity decreased by severe osteopenia. 2. Grade 2 L4-5 anterolisthesis, status post laminectomy. L3-4 posterior decompression. 3. No canal stenosis. Neural foraminal narrowing L2-3 through L5-S1: Severe at L3-4 and L4-5. Electronically Signed   By: Elon Alas M.D.   On: 04/29/2018 21:35   Ct Hip Right Wo Contrast  Result Date: 04/29/2018 CLINICAL DATA:  Patient fell has right hip and low back  pain. EXAM: CT OF THE RIGHT HIP WITHOUT CONTRAST TECHNIQUE: Multidetector CT imaging of the right hip was performed according to the standard protocol. Multiplanar CT image reconstructions were also generated. COMPARISON:  Pelvic radiograph 04/29/2018 FINDINGS: Bones/Joint/Cartilage Nondisplaced incomplete fractures of the base and periphery of the right greater trochanter, series 7/40 and 41 are identified. Subacute to remote inferior sacral alar fracture with sclerosis is also noted, series 4/1. The acetabulum and pubic rami as well as pubic symphysis appear intact. No femoral neck fracture is identified. Ligaments Suboptimally assessed by CT. Muscles and Tendons Mild generalized muscular atrophy about the right hip. No intramuscular hemorrhage or hematoma. Soft tissues No subcutaneous hematoma. Mild soft tissue induration along the lateral aspect of the right hip. Trace right hip joint effusion. IMPRESSION: 1. Incomplete acute appearing fractures at the base and periphery of the right greater trochanter. No avulsion is noted. 2. No femoral neck fracture or joint dislocation. 3. Remote appearing right inferior sacral alar fracture. Electronically Signed   By: Ashley Royalty M.D.   On: 04/29/2018 21:39    Procedures Procedures (including critical care time)  Medications Ordered in ED Medications  HYDROcodone-acetaminophen  (NORCO/VICODIN) 5-325 MG per tablet 1 tablet (1 tablet Oral Given 04/29/18 1731)  morphine 4 MG/ML injection 4 mg (4 mg Intramuscular Given 04/29/18 1858)  morphine 4 MG/ML injection 4 mg (4 mg Intravenous Given 04/29/18 2127)     Initial Impression / Assessment and Plan / ED Course  I have reviewed the triage vital signs and the nursing notes.  Pertinent labs & imaging results that were available during my care of the patient were reviewed by me and considered in my medical decision making (see chart for details).     MDM  Screen complete  Patient is presenting for evaluation following reported fall.  Patient's work-up reveals likely acute fractures of the right greater trochanter.  Additional ED studies reveal likely UTI. Patient will benefit from admission.   Ortho (Dr. Doreatha Martin) aware of case and agrees with ED plan of care.   Hospitalist  (Dr. Myna Hidalgo) service is aware of case and will evaluate for admission.    Final Clinical Impressions(s) / ED Diagnoses   Final diagnoses:  Fall, initial encounter  Closed nondisplaced fracture of greater trochanter of right femur, initial encounter [facility])  Urinary tract infection without hematuria, site unspecified    ED Discharge Orders    None       Valarie Merino, MD 04/29/18 2244

## 2018-04-30 ENCOUNTER — Observation Stay (HOSPITAL_COMMUNITY): Payer: Medicare (Managed Care)

## 2018-04-30 DIAGNOSIS — E039 Hypothyroidism, unspecified: Secondary | ICD-10-CM | POA: Diagnosis not present

## 2018-04-30 DIAGNOSIS — I160 Hypertensive urgency: Secondary | ICD-10-CM | POA: Diagnosis not present

## 2018-04-30 DIAGNOSIS — F0390 Unspecified dementia without behavioral disturbance: Secondary | ICD-10-CM | POA: Diagnosis not present

## 2018-04-30 DIAGNOSIS — S72114A Nondisplaced fracture of greater trochanter of right femur, initial encounter for closed fracture: Secondary | ICD-10-CM | POA: Diagnosis not present

## 2018-04-30 LAB — BASIC METABOLIC PANEL
Anion gap: 10 (ref 5–15)
BUN: 37 mg/dL — ABNORMAL HIGH (ref 8–23)
CO2: 25 mmol/L (ref 22–32)
CREATININE: 1.88 mg/dL — AB (ref 0.44–1.00)
Calcium: 9.1 mg/dL (ref 8.9–10.3)
Chloride: 105 mmol/L (ref 98–111)
GFR calc Af Amer: 26 mL/min — ABNORMAL LOW (ref >60)
GFR calc non Af Amer: 22 mL/min — ABNORMAL LOW (ref >60)
GLUCOSE: 162 mg/dL — AB (ref 70–99)
Potassium: 4.4 mmol/L (ref 3.5–5.1)
Sodium: 140 mmol/L (ref 135–145)

## 2018-04-30 LAB — CBC WITH DIFFERENTIAL/PLATELET
Abs Immature Granulocytes: 0.08 10*3/uL — ABNORMAL HIGH (ref 0.00–0.07)
BASOS PCT: 0 %
Basophils Absolute: 0 10*3/uL (ref 0.0–0.1)
EOS ABS: 0.4 10*3/uL (ref 0.0–0.5)
Eosinophils Relative: 4 %
HCT: 33.5 % — ABNORMAL LOW (ref 36.0–46.0)
Hemoglobin: 10.2 g/dL — ABNORMAL LOW (ref 12.0–15.0)
Immature Granulocytes: 1 %
Lymphocytes Relative: 16 %
Lymphs Abs: 1.6 10*3/uL (ref 0.7–4.0)
MCH: 33 pg (ref 26.0–34.0)
MCHC: 30.4 g/dL (ref 30.0–36.0)
MCV: 108.4 fL — ABNORMAL HIGH (ref 80.0–100.0)
MONO ABS: 0.7 10*3/uL (ref 0.1–1.0)
Monocytes Relative: 7 %
Neutro Abs: 7 10*3/uL (ref 1.7–7.7)
Neutrophils Relative %: 72 %
Platelets: 182 10*3/uL (ref 150–400)
RBC: 3.09 MIL/uL — AB (ref 3.87–5.11)
RDW: 11.9 % (ref 11.5–15.5)
WBC: 9.7 10*3/uL (ref 4.0–10.5)
nRBC: 0 % (ref 0.0–0.2)

## 2018-04-30 MED ORDER — ASPIRIN 325 MG PO TABS
325.0000 mg | ORAL_TABLET | Freq: Every day | ORAL | Status: DC
  Administered 2018-04-30 – 2018-05-01 (×2): 325 mg via ORAL
  Filled 2018-04-30 (×2): qty 1

## 2018-04-30 MED ORDER — OXYCODONE HCL 5 MG PO TABS
2.5000 mg | ORAL_TABLET | ORAL | Status: DC | PRN
  Administered 2018-05-01: 2.5 mg via ORAL
  Filled 2018-04-30: qty 1

## 2018-04-30 MED ORDER — DICLOFENAC SODIUM 1 % TD GEL
2.0000 g | Freq: Four times a day (QID) | TRANSDERMAL | Status: DC
  Administered 2018-04-30 – 2018-05-01 (×3): 2 g via TOPICAL
  Filled 2018-04-30: qty 100

## 2018-04-30 MED ORDER — PROMETHAZINE HCL 25 MG/ML IJ SOLN
6.2500 mg | Freq: Once | INTRAMUSCULAR | Status: AC | PRN
  Administered 2018-04-30: 6.25 mg via INTRAVENOUS
  Filled 2018-04-30: qty 1

## 2018-04-30 MED ORDER — ACETAMINOPHEN 500 MG PO TABS
1000.0000 mg | ORAL_TABLET | Freq: Three times a day (TID) | ORAL | Status: DC
  Administered 2018-04-30 – 2018-05-01 (×4): 1000 mg via ORAL
  Filled 2018-04-30 (×4): qty 2

## 2018-04-30 MED ORDER — LACTATED RINGERS IV SOLN
INTRAVENOUS | Status: DC
  Administered 2018-04-30 – 2018-05-01 (×2): via INTRAVENOUS

## 2018-04-30 MED ORDER — MORPHINE SULFATE (PF) 2 MG/ML IV SOLN
2.0000 mg | INTRAVENOUS | Status: DC | PRN
  Administered 2018-04-30: 2 mg via INTRAVENOUS
  Filled 2018-04-30: qty 1

## 2018-04-30 MED ORDER — SODIUM CHLORIDE 0.9 % IV SOLN
1.0000 g | INTRAVENOUS | Status: DC
  Administered 2018-04-30: 1 g via INTRAVENOUS
  Filled 2018-04-30: qty 1
  Filled 2018-04-30: qty 10

## 2018-04-30 NOTE — Progress Notes (Signed)
Initial Nutrition Assessment  INTERVENTION:   Continue Ensure Enlive po BID, each supplement provides 350 kcal and 20 grams of protein  NUTRITION DIAGNOSIS:   Increased nutrient needs related to (hip fracture) as evidenced by estimated needs.  GOAL:   Patient will meet greater than or equal to 90% of their needs  MONITOR:   PO intake, Supplement acceptance, Labs, Weight trends, I & O's  REASON FOR ASSESSMENT:   Malnutrition Screening Tool    ASSESSMENT:   84 y.o. female with medical history significant for dementia, depression, anxiety, hypothyroidism, chronic kidney disease stage IV, and chronic microcytic anemia, now presenting from her SNF for evaluation of severe pain in the low back and buttock after a fall.   Patient currently NPO, was awaiting plan from ortho. Per ortho note, pt will be managed non-operatively. Once diet is advanced, pt would benefit from nutritional supplementation for aid in healing. Will order Ensure supplements when able.   Per weight records, pt with some weight loss since July 2018. No recent weights in chart to confirm significant weight loss.  Medications: Lactated Ringers infusion, IV Zofran PRN Labs reviewed: GFR:  26  NUTRITION - FOCUSED PHYSICAL EXAM:  Nutrition focused physical exam shows no sign of depletion of muscle mass or body fat. Pt with advanced age.  Diet Order:   Diet Order            Diet NPO time specified  Diet effective now              EDUCATION NEEDS:   No education needs have been identified at this time  Skin:  Skin Assessment: Reviewed RN Assessment  Last BM:  11/20  Height:   Ht Readings from Last 1 Encounters:  04/30/18 5' 9.5" (1.765 m)    Weight:   Wt Readings from Last 1 Encounters:  04/30/18 88.8 kg    Ideal Body Weight:  68.1 kg  BMI:  Body mass index is 28.5 kg/m.  Estimated Nutritional Needs:   Kcal:  1500-1700  Protein:  75-85g  Fluid:  1.5L/day   Clayton Bibles, MS, RD,  LDN [place] Dietitian Pager: [phone] After Hours Pager: [phone]

## 2018-04-30 NOTE — Consult Note (Signed)
Orthopaedic Trauma Service (OTS) Consult   Patient ID: Alexandra Booth MRN: [id number] DOB/AGE: 1923-12-05 84 y.o.  Reason for Consult:right greater trochanteric femur fracture Referring Physician: Dr. Dene Gentry, MD [facility]  HPI: Alexandra Booth is an 84 y.o. female who is being seen in consultation at the request of Dr. Francia Greaves for evaluation of right hip pain.  Patient has a history of dementia, depression, anxiety, hypothyroidism and chronic kidney disease anemia that presented from her skilled nursing facility after severe pain in the low back and right side after a fall.  States that she landed on her left side but she is having pain in her right hip.  X-rays and CT scan were obtained which showed a nondisplaced greater trochanteric fracture without any involvement of the intratrochanteric region.  Currently patient is complaining of severe pain in the right hip.  It worsens with movement.  Gets better with rest and pain medication.  She denies any pain in her lower part of her extremity.  She was seen on third floor at [facility].  Past Medical History:  Diagnosis Date  . Dementia ([place])   . Depression with anxiety   . Diabetes mellitus without complication ([place])   . GERD (gastroesophageal reflux disease)   . Hypothyroidism     Past Surgical History:  Procedure Laterality Date  . FEMUR IM NAIL Left 09/12/2014   Procedure: INTRAMEDULLARY (IM) NAIL FEMORAL;  Surgeon: Leandrew Koyanagi, MD;  Location: [place];  Service: Orthopedics;  Laterality: Left;  . LAMINECTOMY    . TIBIA IM NAIL INSERTION Left 12/19/2016   Procedure: INTRAMEDULLARY (IM) NAIL TIBIAL;  Surgeon: Leandrew Koyanagi, MD;  Location: [place];  Service: Orthopedics;  Laterality: Left;    Family History  Problem Relation Age of Onset  . Kidney disease Mother   . Kidney disease Father     Social History:  reports that she has quit smoking. She has never used smokeless tobacco. She reports that she does not drink  alcohol or use drugs.  Allergies:  Allergies  Allergen Reactions  . Diazepam     REACTION: Unknown reaction  . Penicillins     REACTION: rash, itching Has patient had a PCN reaction causing immediate rash, facial/tongue/throat swelling, SOB or lightheadedness with hypotension: Yes Has patient had a PCN reaction causing severe rash involving mucus membranes or skin necrosis: Unknown Has patient had a PCN reaction that required hospitalization: Unknown Has patient had a PCN reaction occurring within the last 10 years: Unknown If all of the above answers are "NO", then may proceed with Cephalosporin use.   . Sulfonamide Derivatives     REACTION: GI upset    Medications:  No current facility-administered medications on file prior to encounter.    Current Outpatient Medications on File Prior to Encounter  Medication Sig Dispense Refill  . acetaminophen (TYLENOL) 650 MG CR tablet Take 650 mg 2 (two) times daily by mouth.    . calcium carbonate (OS-CAL - DOSED IN MG OF ELEMENTAL CALCIUM) 1250 (500 CA) MG tablet Take 1 tablet (500 mg of elemental calcium total) by mouth daily with breakfast.    . Cholecalciferol 1000 UNITS capsule Take 1,000 Units by mouth daily.    Marland Kitchen donepezil (ARICEPT) 10 MG tablet Take 10 mg by mouth at bedtime.    Marland Kitchen escitalopram (LEXAPRO) 20 MG tablet Take 20 mg by mouth daily.     . feeding supplement, ENSURE ENLIVE, (ENSURE ENLIVE) LIQD Take 237 mLs  by mouth 2 (two) times daily between meals. 237 mL 12  . furosemide (LASIX) 20 MG tablet Take 20 mg by mouth.    . iron polysaccharides (NIFEREX) 150 MG capsule Take 150 mg 2 (two) times daily by mouth.    . levothyroxine (SYNTHROID, LEVOTHROID) 25 MCG tablet Take 25 mcg by mouth daily before breakfast.    . LORazepam (ATIVAN) 0.5 MG tablet Take 0.125 mg by mouth 2 (two) times daily.    . memantine (NAMENDA) 10 MG tablet Take 10 mg by mouth 2 (two) times daily.    . Multiple Vitamins-Minerals (MULTIVITAMIN WITH MINERALS)  tablet Take 1 tablet by mouth daily.    . pantoprazole (PROTONIX) 40 MG tablet Take 40 mg by mouth daily.    Vladimir Faster Glycol-Propyl Glycol (SYSTANE) 0.4-0.3 % SOLN Apply 1 drop to eye 2 (two) times daily as needed (dry eyes).    . traZODone (DESYREL) 50 MG tablet Take 50 mg by mouth at bedtime.    . Menthol, Topical Analgesic, (BIOFREEZE EX) Apply 1 application topically 3 (three) times daily. 3% Apply to knees    . nystatin (NYSTATIN) powder Apply 1 Bottle topically 3 (three) times daily as needed (skin irritation).    Marland Kitchen omeprazole (PRILOSEC) 40 MG capsule Take 40 mg by mouth 2 (two) times daily.    . phenazopyridine (PYRIDIUM) 200 MG tablet Take 200 mg by mouth daily as needed for pain (painful urination).    Marland Kitchen senna (SENOKOT) 8.6 MG tablet Take 1 tablet by mouth 2 (two) times daily.    . solifenacin (VESICARE) 5 MG tablet Take 5 mg by mouth daily.      ROS: Unable to fully cooperate with review of systems.  Exam: Blood pressure (!) 178/74, pulse 70, temperature (!) 97.2 F (36.2 C), temperature source Oral, resp. rate 18, height 5' 9.5" (1.765 m), weight 88.8 kg, SpO2 95 %. General: No acute distress Orientation: Awake and alert Mood and Affect: Cooperative and appropriate affect Gait: Unable to assess Coordination and balance: Within normal limits  Right lower extremity: Skin without lesions.  Tenderness palpation over the greater trochanter.  Some discomfort with internal and external rotation of the leg.  Discomfort with flexion extension of the hip.  Motor and sensory function intact to the foot and ankle.  Warm and well-perfused foot.  No lymphadenopathy with reflexes are within normal limits.  Left lower extremity: Skin without lesions. No tenderness to palpation. Full painless ROM, full strength in each muscle groups without evidence of instability.   Medical Decision Making: Imaging: X-rays of the right hip along with CT scan are reviewed.  It shows a nondisplaced tip of  the greater trochanteric fracture.  There did not appear to be any extension into the femoral neck or intertrochanteric region.  Labs:  Results for orders placed or performed during the hospital encounter of 04/29/18 (from the past 24 hour(s))  Comprehensive metabolic panel     Status: Abnormal   Collection Time: 04/29/18  8:31 PM  Result Value Ref Range   Sodium 142 135 - 145 mmol/L   Potassium 4.5 3.5 - 5.1 mmol/L   Chloride 108 98 - 111 mmol/L   CO2 26 22 - 32 mmol/L   Glucose, Bld 111 (H) 70 - 99 mg/dL   BUN 38 (H) 8 - 23 mg/dL   Creatinine, Ser 2.14 (H) 0.44 - 1.00 mg/dL   Calcium 9.3 8.9 - 10.3 mg/dL   Total Protein 7.1 6.5 - 8.1 g/dL  Albumin 3.2 (L) 3.5 - 5.0 g/dL   AST 27 15 - 41 U/L   ALT 20 0 - 44 U/L   Alkaline Phosphatase 87 38 - 126 U/L   Total Bilirubin 0.5 0.3 - 1.2 mg/dL   GFR calc non Af Amer 19 (L) >60 mL/min   GFR calc Af Amer 22 (L) >60 mL/min   Anion gap 8 5 - 15  CBC with Differential     Status: Abnormal   Collection Time: 04/29/18  8:31 PM  Result Value Ref Range   WBC 9.3 4.0 - 10.5 K/uL   RBC 3.03 (L) 3.87 - 5.11 MIL/uL   Hemoglobin 10.0 (L) 12.0 - 15.0 g/dL   HCT 32.9 (L) 36.0 - 46.0 %   MCV 108.6 (H) 80.0 - 100.0 fL   MCH 33.0 26.0 - 34.0 pg   MCHC 30.4 30.0 - 36.0 g/dL   RDW 11.9 11.5 - 15.5 %   Platelets 199 150 - 400 K/uL   nRBC 0.0 0.0 - 0.2 %   Neutrophils Relative % 70 %   Neutro Abs 6.5 1.7 - 7.7 K/uL   Lymphocytes Relative 19 %   Lymphs Abs 1.8 0.7 - 4.0 K/uL   Monocytes Relative 8 %   Monocytes Absolute 0.8 0.1 - 1.0 K/uL   Eosinophils Relative 2 %   Eosinophils Absolute 0.2 0.0 - 0.5 K/uL   Basophils Relative 0 %   Basophils Absolute 0.0 0.0 - 0.1 K/uL   Immature Granulocytes 1 %   Abs Immature Granulocytes 0.07 0.00 - 0.07 K/uL  Urinalysis, Routine w reflex microscopic     Status: Abnormal   Collection Time: 04/29/18  9:25 PM  Result Value Ref Range   Color, Urine YELLOW YELLOW   APPearance HAZY (A) CLEAR   Specific  Gravity, Urine 1.012 1.005 - 1.030   pH 7.0 5.0 - 8.0   Glucose, UA NEGATIVE NEGATIVE mg/dL   Hgb urine dipstick SMALL (A) NEGATIVE   Bilirubin Urine NEGATIVE NEGATIVE   Ketones, ur NEGATIVE NEGATIVE mg/dL   Protein, ur 100 (A) NEGATIVE mg/dL   Nitrite NEGATIVE NEGATIVE   Leukocytes, UA LARGE (A) NEGATIVE   RBC / HPF 0-5 0 - 5 RBC/hpf   WBC, UA 21-50 0 - 5 WBC/hpf   Bacteria, UA MANY (A) NONE SEEN   Squamous Epithelial / LPF 0-5 0 - 5  Basic metabolic panel     Status: Abnormal   Collection Time: 04/30/18  5:27 AM  Result Value Ref Range   Sodium 140 135 - 145 mmol/L   Potassium 4.4 3.5 - 5.1 mmol/L   Chloride 105 98 - 111 mmol/L   CO2 25 22 - 32 mmol/L   Glucose, Bld 162 (H) 70 - 99 mg/dL   BUN 37 (H) 8 - 23 mg/dL   Creatinine, Ser 1.88 (H) 0.44 - 1.00 mg/dL   Calcium 9.1 8.9 - 10.3 mg/dL   GFR calc non Af Amer 22 (L) >60 mL/min   GFR calc Af Amer 26 (L) >60 mL/min   Anion gap 10 5 - 15  CBC WITH DIFFERENTIAL     Status: Abnormal   Collection Time: 04/30/18  5:27 AM  Result Value Ref Range   WBC 9.7 4.0 - 10.5 K/uL   RBC 3.09 (L) 3.87 - 5.11 MIL/uL   Hemoglobin 10.2 (L) 12.0 - 15.0 g/dL   HCT 33.5 (L) 36.0 - 46.0 %   MCV 108.4 (H) 80.0 - 100.0 fL   MCH 33.0  26.0 - 34.0 pg   MCHC 30.4 30.0 - 36.0 g/dL   RDW 11.9 11.5 - 15.5 %   Platelets 182 150 - 400 K/uL   nRBC 0.0 0.0 - 0.2 %   Neutrophils Relative % 72 %   Neutro Abs 7.0 1.7 - 7.7 K/uL   Lymphocytes Relative 16 %   Lymphs Abs 1.6 0.7 - 4.0 K/uL   Monocytes Relative 7 %   Monocytes Absolute 0.7 0.1 - 1.0 K/uL   Eosinophils Relative 4 %   Eosinophils Absolute 0.4 0.0 - 0.5 K/uL   Basophils Relative 0 %   Basophils Absolute 0.0 0.0 - 0.1 K/uL   Immature Granulocytes 1 %   Abs Immature Granulocytes 0.08 (H) 0.00 - 0.07 K/uL    Medical history and chart was reviewed  Assessment/Plan: 84 year old female with multiple medical issues that presents after a fall with a right greater trochanteric fracture  Patient  does not have any radiographic findings that would require any surgical intervention.  She can be treated nonoperatively.  I recommend weightbearing as tolerated to the right lower extremity with no active hip abduction for 6 weeks.  Patient may mobilize with physical and occupational therapy.  She may follow-up with me in 2 to 3 weeks for repeat x-rays of her hip.   Shona Needles, MD [facility] [phone] (phone)

## 2018-04-30 NOTE — Evaluation (Signed)
Physical Therapy Evaluation Patient Details Name: Alexandra Booth MRN: [id number] DOB: 1923-10-24 Today's Date: 04/30/2018   History of Present Illness  84 y.o female with history of dementia, depression, anxiety, hypothyroidism and chronic kidney disease anemia that presented from her skilled nursing facility after severe pain in the low back and right side after a fall.  States that she landed on her left side but she is having pain in her right hip.  X-rays and CT scan were obtained which showed a nondisplaced greater trochanteric fracture. PEr ortho, plan is conservative management (no surgery), no active R hip ABDUction.  Clinical Impression  Pt admitted with above diagnosis. Pt currently with functional limitations due to the deficits listed below (see PT Problem List). +2 total assist for supine to sit, pt sat on edge of bed x 3 minutes, activity tolerance significantly limited by pain.  Pt will benefit from skilled PT to increase their independence and safety with mobility to allow discharge to the venue listed below.       Follow Up Recommendations SNF;Supervision/Assistance - 24 hour    Equipment Recommendations  None recommended by PT    Recommendations for Other Services       Precautions / Restrictions Precautions Precautions: Fall Restrictions Weight Bearing Restrictions: Yes RLE Weight Bearing: Weight bearing as tolerated Other Position/Activity Restrictions: no active ABDuction of R hip      Mobility  Bed Mobility Overal bed mobility: Needs Assistance Bed Mobility: Supine to Sit;Sit to Supine     Supine to sit: Total assist;+2 for physical assistance Sit to supine: Total assist;+2 for physical assistance   General bed mobility comments: +2 total assist to advance BLEs and to raise trunk, pt sat on edge of bed for ~3 minutes, tolerance limited by back pain  Transfers                    Ambulation/Gait                Stairs             Wheelchair Mobility    Modified Rankin (Stroke Patients Only)       Balance Overall balance assessment: Needs assistance Sitting-balance support: Bilateral upper extremity supported;Feet supported Sitting balance-Leahy Scale: Fair Sitting balance - Comments: requires BUE support                                     Pertinent Vitals/Pain Pain Assessment: 0-10 Pain Score: 8  Pain Location: back, LLE, RLE, stomach, "all over" Pain Descriptors / Indicators: Sore;Grimacing;Guarding;Crying Pain Intervention(s): Limited activity within patient's tolerance;Monitored during session;Repositioned;Patient requesting pain meds-RN notified    Home Living Family/patient expects to be discharged to:: Skilled nursing facility                 Additional Comments: pt admitted from SNF    Prior Function Level of Independence: Needs assistance   Gait / Transfers Assistance Needed: pt reports SNF staff transferred her to [facility] with "the monster" (likely hoyer lift), but not everyday, she's unable to propel WC due to "terrible arthritis all over"           Hand Dominance   Dominant Hand: Right    Extremity/Trunk Assessment   Upper Extremity Assessment Upper Extremity Assessment: Generalized weakness;RUE deficits/detail;LUE deficits/detail(AAROM B shoulders ~40*, grip ~+3/5) RUE: Unable to fully assess due to pain LUE: Unable to fully assess  due to pain    Lower Extremity Assessment Lower Extremity Assessment: Generalized weakness;RLE deficits/detail;LLE deficits/detail RLE: Unable to fully assess due to pain LLE Deficits / Details: pt unable to tolerate heel slides AAROM on L 2* pain (she stated she fell on L side), B ankle PF/DF AROM decr ~75% LLE: Unable to fully assess due to pain       Communication   Communication: No difficulties  Cognition Arousal/Alertness: Awake/alert Behavior During Therapy: WFL for tasks assessed/performed Overall Cognitive  Status: No family/caregiver present to determine baseline cognitive functioning                                 General Comments: pt has dementia per chart, she is oriented to self and location, able to follow 1 step commands consistently, some memory issues      General Comments      Exercises General Exercises - Lower Extremity Ankle Circles/Pumps: AROM;Both;5 reps;Supine Long Arc Quad: AAROM;Both;5 reps;Seated   Assessment/Plan    PT Assessment Patient needs continued PT services  PT Problem List Decreased strength;Decreased range of motion;Decreased activity tolerance;Decreased balance;Decreased mobility;Pain;Obesity       PT Treatment Interventions Functional mobility training;Therapeutic activities;Therapeutic exercise;Balance training;Patient/family education    PT Goals (Current goals can be found in the Care Plan section)  Acute Rehab PT Goals Patient Stated Goal: none stated, pt stated she "mostly stays in bed" at SNF PT Goal Formulation: With patient Time For Goal Achievement: 05/14/18 Potential to Achieve Goals: Fair    Frequency Min 2X/week   Barriers to discharge        Co-evaluation               AM-PAC PT "6 Clicks" Daily Activity  Outcome Measure Difficulty turning over in bed (including adjusting bedclothes, sheets and blankets)?: Unable Difficulty moving from lying on back to sitting on the side of the bed? : Unable Difficulty sitting down on and standing up from a chair with arms (e.g., wheelchair, bedside commode, etc,.)?: Unable Help needed moving to and from a bed to chair (including a wheelchair)?: Total Help needed walking in hospital room?: Total Help needed climbing 3-5 steps with a railing? : Total 6 Click Score: 6    End of Session   Activity Tolerance: Patient limited by pain Patient left: in bed;with call bell/phone within reach;with bed alarm set Nurse Communication: Mobility status;Patient requests pain meds PT  Visit Diagnosis: Pain;Muscle weakness (generalized) (M62.81);History of falling (Z91.81);Other abnormalities of gait and mobility (R26.89) Pain - Right/Left: Right Pain - part of body: Hip    Time: 4801-6553 PT Time Calculation (min) (ACUTE ONLY): 17 min   Charges:   PT Evaluation $PT Eval Moderate Complexity: 1 Mod         Philomena Doheny PT 04/30/2018  Acute Rehabilitation Services Pager [phone] Office [phone]

## 2018-04-30 NOTE — Progress Notes (Signed)
PROGRESS NOTE    Alexandra Booth  YQI:[id number] DOB: [birth year] DOA: 04/29/2018 PCP: Janifer Adie, MD   Brief Narrative:  Per HPI Alexandra Booth is a 84 y.o. female with medical history significant for dementia, depression, anxiety, hypothyroidism, chronic kidney disease stage IV, and chronic microcytic anemia, now presenting from her SNF for evaluation of severe pain in the low back and buttock after a fall.  Patient reports that she was reaching for a call bell when she fell, landing on her left side without hitting her head or losing consciousness.  She reports experiencing severe pain in the low back and buttock after the fall.  She also reports some suprapubic pain but is unable to say when this began.  She reports being in her usual state prior to the fall and denies fevers, chills, chest pain, or shortness of breath, but remarks "I am 84 years old; I don't really know."    Assessment & Plan:   Principal Problem:   Closed fracture of greater trochanter of right femur ([place]) Active Problems:   Hypothyroidism   Dementia ([place])   CKD (chronic kidney disease), stage IV ([facility])   Macrocytic anemia   Depression with anxiety   Hypertensive urgency   Acute lower UTI   1. Right greater trochanter fracture  - Presents from SNF with low back and buttock pain after a fall  - Imaging suggests incomplete acute right greater trochanter fracture  - Ortho c/s, appreciate recs - recommending weightbearing as tolerated to RLE with no active hip abduction for 6 weeks.  Mobilize with PT and OT.  F/u in 2-3 weeks for repeat x rays. - Discussed DVT ppx with ortho who recommended ASA 325 mg daily x 6 weeks - Pt still with significant pain, especially with activity.  Scheduled APAP, oxycodone 2.5 mg q4 hrs prn.  IV pain meds prn. - she mentions that she hit her head when she fell, will f/u head CT  2. UTI  - Complains of suprapubic pain and UA is compatible with infection  - Urine sent for  culture and empiric Rocephin given in ED  - Continue Rocephin and follow culture and clinical course   # Abdominal Discomfort: - on interview while I was in room, pt eating and then began belching frequently, c/o discomfort - apparently pt on chopped diet at facility, initially placed NPO while awaiting speech eval, but will place on soft diet until this can be done - continue antiemetics, ekg for QT eval  3. CKD stage IV  - SCr is 2.14 on admission, slightly higher than priors  - 1.88 today, continue to monitor - Renally-dose medications    4. Hypertensive urgency  - improved, follow   5. Macrocytic anemia  - Hgb is stable at 10.0 on admission without active bleeding   6. Dementia; depression with anxiety; insomnia  - Continue Aricept, Namenda, Lexapro, Ativan, and trazodone    7. Hypothyroidism   - Continue Synthroid    DVT prophylaxis: heparin Code Status: dnr Family Communication: none at bedside Disposition Plan: pending improvement in pain control   Consultants:   orthopedics  Procedures:   none  Antimicrobials:  Anti-infectives (From admission, onward)   Start     Dose/Rate Route Frequency Ordered Stop   04/30/18 2200  cefTRIAXone (ROCEPHIN) 1 g in sodium chloride 0.9 % 100 mL IVPB  Status:  Discontinued     1 g 200 mL/hr over 30 Minutes Intravenous  Once 04/29/18 2313 04/30/18 1702  04/30/18 2200  cefTRIAXone (ROCEPHIN) 1 g in sodium chloride 0.9 % 100 mL IVPB     1 g 200 mL/hr over 30 Minutes Intravenous Every 24 hours 04/30/18 1702     04/29/18 2230  cefTRIAXone (ROCEPHIN) 1 g in sodium chloride 0.9 % 100 mL IVPB     1 g 200 mL/hr over 30 Minutes Intravenous  Once 04/29/18 2222 04/29/18 2321     Subjective: Fell off bed reaching for call bell. Pt eating during interview.  Became uncomfortable, frequently belching.    Objective: Vitals:   04/30/18 0540 04/30/18 1359 04/30/18 1543 04/30/18 1626  BP: (!) 178/74 (!) 178/48  (!) 145/57  Pulse:  70 69 64 (!) 59  Resp: 18 19    Temp: (!) 97.2 F (36.2 C) 98.4 F (36.9 C)    TempSrc: Oral Oral    SpO2: 95% 90% 98%   Weight:      Height:        Intake/Output Summary (Last 24 hours) at 04/30/2018 1905 Last data filed at 04/30/2018 1812 Gross per 24 hour  Intake 340.37 ml  Output 300 ml  Net 40.37 ml   Filed Weights   04/30/18 0035  Weight: 88.8 kg    Examination:  General exam: Appears calm and comfortable (then became uncomfortable, belching frequently) Respiratory system: Clear to auscultation. Respiratory effort normal. Cardiovascular system: S1 & S2 heard, RRR. Gastrointestinal system: Abdomen is nondistended, soft and nontender Central nervous system: Alert and oriented. No focal neurological deficits. Extremities: no  Skin: No rashes, lesions or ulcers Psychiatry: Judgement and insight appear normal. Mood & affect appropriate.     Data Reviewed: I have personally reviewed following labs and imaging studies  CBC: Recent Labs  Lab 04/29/18 2031 04/30/18 0527  WBC 9.3 9.7  NEUTROABS 6.5 7.0  HGB 10.0* 10.2*  HCT 32.9* 33.5*  MCV 108.6* 108.4*  PLT 199 540   Basic Metabolic Panel: Recent Labs  Lab 04/29/18 2031 04/30/18 0527  NA 142 140  K 4.5 4.4  CL 108 105  CO2 26 25  GLUCOSE 111* 162*  BUN 38* 37*  CREATININE 2.14* 1.88*  CALCIUM 9.3 9.1   GFR: Estimated Creatinine Clearance: 22.9 mL/min (A) (by C-G formula based on SCr of 1.88 mg/dL (H)). Liver Function Tests: Recent Labs  Lab 04/29/18 2031  AST 27  ALT 20  ALKPHOS 87  BILITOT 0.5  PROT 7.1  ALBUMIN 3.2*   No results for input(s): LIPASE, AMYLASE in the last 168 hours. No results for input(s): AMMONIA in the last 168 hours. Coagulation Profile: No results for input(s): INR, PROTIME in the last 168 hours. Cardiac Enzymes: No results for input(s): CKTOTAL, CKMB, CKMBINDEX, TROPONINI in the last 168 hours. BNP (last 3 results) No results for input(s): PROBNP in the last 8760  hours. HbA1C: No results for input(s): HGBA1C in the last 72 hours. CBG: No results for input(s): GLUCAP in the last 168 hours. Lipid Profile: No results for input(s): CHOL, HDL, LDLCALC, TRIG, CHOLHDL, LDLDIRECT in the last 72 hours. Thyroid Function Tests: No results for input(s): TSH, T4TOTAL, FREET4, T3FREE, THYROIDAB in the last 72 hours. Anemia Panel: No results for input(s): VITAMINB12, FOLATE, FERRITIN, TIBC, IRON, RETICCTPCT in the last 72 hours. Sepsis Labs: No results for input(s): PROCALCITON, LATICACIDVEN in the last 168 hours.  No results found for this or any previous visit (from the past 240 hour(s)).       Radiology Studies: Dg Lumbar Spine Complete  Result Date:  04/29/2018 CLINICAL DATA:  Bilateral leg pain and abdominal pain. Prior fall. EXAM: LUMBAR SPINE - COMPLETE 4+ VIEW COMPARISON:  CT abdomen 12/07/2017 FINDINGS: Prominent bony demineralization with resulting ill definition of some of the visualized cortex, for example along the upper sacrum and iliac bones. Levoconvex lumbar scoliosis. Dense atherosclerotic calcification particularly of the aortoiliac tree. Grade 2 anterolisthesis at L4-5 with loss of disc height as shown on the 12/07/2017 exam. Patient has had prior laminectomy at L4-5. IMPRESSION: 1. No acute lumbar spine findings. Grade 2 anterolisthesis at L4-5 is chronic. 2. Significantly reduced sensitivity due to bony demineralization causing poor definition of cortical margins. If there is a high clinical index of suspicion of occult fracture, lumbar spine CT or MRI would be recommended. 3. Levoconvex lumbar scoliosis. 4. Lower lumbar spondylosis and degenerative disc disease. Postoperative findings at L4-5. 5. Atherosclerosis. Electronically Signed   By: Van Clines M.D.   On: 04/29/2018 20:02   Dg Pelvis 1-2 Views  Result Date: 04/29/2018 CLINICAL DATA:  Bilateral leg pain, abdominal pain status post fall EXAM: PELVIS - 1-2 VIEW COMPARISON:   None. FINDINGS: Generalized osteopenia. Slight cortical irregularity along the peripheral aspect of the right greater trochanter which may be projectional versus a isolated nondisplaced right greater trochanteric fracture. No other fracture or dislocation. Prior ORIF of a right femoral neck fracture. Mild osteoarthritis of bilateral SI joints. IMPRESSION: Slight cortical irregularity along the peripheral aspect of the right greater trochanter which may be projectional versus a isolated nondisplaced right greater trochanteric fracture. Correlate with point tenderness. Electronically Signed   By: Kathreen Devoid   On: 04/29/2018 20:13   Ct Lumbar Spine Wo Contrast  Result Date: 04/29/2018 CLINICAL DATA:  Fall, low back pain.  Follow-up abnormal radiograph. EXAM: CT LUMBAR SPINE WITHOUT CONTRAST TECHNIQUE: Multidetector CT imaging of the lumbar spine was performed without intravenous contrast administration. Multiplanar CT image reconstructions were also generated. COMPARISON:  Lumbar spine radiographs April 29, 2018 FINDINGS: SEGMENTATION: For the purposes of this report the last well-formed intervertebral disc space is reported as L5-S1. ALIGNMENT: Maintained lumbar lordosis. Grade 2 L4-5 anterolisthesis. Mild broad levoscoliosis. VERTEBRAE: Vertebral bodies intact. Severe L4-5 disc height loss with vacuum disc, mild at L5-S1 compatible with degenerative discs. Remaining disc heights preserved. Severe osteopenia without destructive bony lesions. No destructive bony lesions. Localizer densities LEFT hip pinning. PARASPINAL AND OTHER SOFT TISSUES: Nonacute. Small hiatal hernia. Status post cholecystectomy. Severe calcific atherosclerosis aortoiliac vessels. Colonic diverticulosis. DISC LEVELS: T12-L1, L1-2 No disc bulge, canal stenosis nor neural foraminal narrowing. Moderate L1-2 facet arthropathy. L2-3: Small broad-based disc osteophyte complex asymmetric to LEFT. Moderate facet arthropathy and ligamentum flavum  redundancy without canal stenosis. Minimal LEFT neural foraminal narrowing. L3-4: Moderate broad-based disc osteophyte complex. Posterior decompression with moderate to severe facet arthropathy. No canal stenosis. Severe bilateral neural foraminal narrowing. L4-5: Anterolisthesis. RIGHT hemilaminectomy. Severe facet arthropathy without canal stenosis. Severe bilateral neural foraminal narrowing. L5-S1: Small broad-based disc osteophyte complex. Severe RIGHT and moderate LEFT facet arthropathy. No canal stenosis. Moderate LEFT neural foraminal narrowing. IMPRESSION: 1. No fracture though is sensitivity decreased by severe osteopenia. 2. Grade 2 L4-5 anterolisthesis, status post laminectomy. L3-4 posterior decompression. 3. No canal stenosis. Neural foraminal narrowing L2-3 through L5-S1: Severe at L3-4 and L4-5. Electronically Signed   By: Elon Alas M.D.   On: 04/29/2018 21:35   Ct Hip Right Wo Contrast  Result Date: 04/29/2018 CLINICAL DATA:  Patient fell has right hip and low back pain. EXAM: CT OF THE  RIGHT HIP WITHOUT CONTRAST TECHNIQUE: Multidetector CT imaging of the right hip was performed according to the standard protocol. Multiplanar CT image reconstructions were also generated. COMPARISON:  Pelvic radiograph 04/29/2018 FINDINGS: Bones/Joint/Cartilage Nondisplaced incomplete fractures of the base and periphery of the right greater trochanter, series 7/40 and 41 are identified. Subacute to remote inferior sacral alar fracture with sclerosis is also noted, series 4/1. The acetabulum and pubic rami as well as pubic symphysis appear intact. No femoral neck fracture is identified. Ligaments Suboptimally assessed by CT. Muscles and Tendons Mild generalized muscular atrophy about the right hip. No intramuscular hemorrhage or hematoma. Soft tissues No subcutaneous hematoma. Mild soft tissue induration along the lateral aspect of the right hip. Trace right hip joint effusion. IMPRESSION: 1. Incomplete  acute appearing fractures at the base and periphery of the right greater trochanter. No avulsion is noted. 2. No femoral neck fracture or joint dislocation. 3. Remote appearing right inferior sacral alar fracture. Electronically Signed   By: Ashley Royalty M.D.   On: 04/29/2018 21:39        Scheduled Meds: . acetaminophen  1,000 mg Oral Q8H  . aspirin  325 mg Oral Daily  . donepezil  10 mg Oral QHS  . escitalopram  20 mg Oral Daily  . feeding supplement (ENSURE ENLIVE)  237 mL Oral BID BM  . heparin  5,000 Units Subcutaneous Q8H  . levothyroxine  25 mcg Oral QAC breakfast  . LORazepam  0.25 mg Oral BID  . memantine  10 mg Oral BID  . pantoprazole  40 mg Oral Daily  . senna  1 tablet Oral BID  . sodium chloride flush  3 mL Intravenous Q12H  . traZODone  50 mg Oral QHS   Continuous Infusions: . sodium chloride    . cefTRIAXone (ROCEPHIN)  IV    . lactated ringers 75 mL/hr at 04/30/18 1225     LOS: 0 days    Time spent: over 30 min    Fayrene Helper, MD [facility] Pager [phone]  If 7PM-7AM, please contact night-coverage [email or web address] Password [email or web address] 04/30/2018, 7:05 PM

## 2018-05-01 DIAGNOSIS — S72114A Nondisplaced fracture of greater trochanter of right femur, initial encounter for closed fracture: Secondary | ICD-10-CM | POA: Diagnosis not present

## 2018-05-01 LAB — MAGNESIUM: MAGNESIUM: 1.8 mg/dL (ref 1.7–2.4)

## 2018-05-01 LAB — CBC
HEMATOCRIT: 28.4 % — AB (ref 36.0–46.0)
HEMOGLOBIN: 8.4 g/dL — AB (ref 12.0–15.0)
MCH: 32.3 pg (ref 26.0–34.0)
MCHC: 29.6 g/dL — AB (ref 30.0–36.0)
MCV: 109.2 fL — ABNORMAL HIGH (ref 80.0–100.0)
Platelets: 174 10*3/uL (ref 150–400)
RBC: 2.6 MIL/uL — ABNORMAL LOW (ref 3.87–5.11)
RDW: 11.9 % (ref 11.5–15.5)
WBC: 6.3 10*3/uL (ref 4.0–10.5)
nRBC: 0 % (ref 0.0–0.2)

## 2018-05-01 LAB — COMPREHENSIVE METABOLIC PANEL
ALK PHOS: 66 U/L (ref 38–126)
ALT: 18 U/L (ref 0–44)
AST: 23 U/L (ref 15–41)
Albumin: 2.8 g/dL — ABNORMAL LOW (ref 3.5–5.0)
Anion gap: 5 (ref 5–15)
BILIRUBIN TOTAL: 0.4 mg/dL (ref 0.3–1.2)
BUN: 37 mg/dL — ABNORMAL HIGH (ref 8–23)
CALCIUM: 8.6 mg/dL — AB (ref 8.9–10.3)
CO2: 26 mmol/L (ref 22–32)
Chloride: 106 mmol/L (ref 98–111)
Creatinine, Ser: 2.24 mg/dL — ABNORMAL HIGH (ref 0.44–1.00)
GFR, EST AFRICAN AMERICAN: 21 mL/min — AB (ref >60)
GFR, EST NON AFRICAN AMERICAN: 18 mL/min — AB (ref >60)
Glucose, Bld: 126 mg/dL — ABNORMAL HIGH (ref 70–99)
Potassium: 4.5 mmol/L (ref 3.5–5.1)
Sodium: 137 mmol/L (ref 135–145)
TOTAL PROTEIN: 6.3 g/dL — AB (ref 6.5–8.1)

## 2018-05-01 LAB — LIPASE, BLOOD: Lipase: 176 U/L — ABNORMAL HIGH (ref 11–51)

## 2018-05-01 LAB — HEMOGLOBIN AND HEMATOCRIT, BLOOD
HCT: 31.6 % — ABNORMAL LOW (ref 36.0–46.0)
HEMOGLOBIN: 9.3 g/dL — AB (ref 12.0–15.0)

## 2018-05-01 MED ORDER — POLYETHYLENE GLYCOL 3350 17 G PO PACK: 17.0000 g | PACK | Freq: Every day | ORAL | Status: DC

## 2018-05-01 MED ORDER — ACETAMINOPHEN 500 MG PO TABS: 1000.0000 mg | ORAL_TABLET | Freq: Three times a day (TID) | ORAL | 0 refills | Status: AC

## 2018-05-01 MED ORDER — OXYCODONE HCL 5 MG PO TABS: 2.5000 mg | ORAL_TABLET | Freq: Three times a day (TID) | ORAL | 0 refills | Status: AC | PRN

## 2018-05-01 MED ORDER — ASPIRIN 325 MG PO TABS: 325.0000 mg | ORAL_TABLET | Freq: Every day | ORAL | 0 refills | Status: AC

## 2018-05-01 MED ORDER — CEPHALEXIN 250 MG PO CAPS: 250.0000 mg | ORAL_CAPSULE | Freq: Two times a day (BID) | ORAL | 0 refills | Status: AC

## 2018-05-01 NOTE — Evaluation (Signed)
SLP Cancellation Note  Patient Details Name: Alexandra Booth MRN: [id number] DOB: [birth year]   Cancelled treatment:       Reason Eval/Treat Not Completed: Other (comment)(note pt to dc and managing diet well currently, apologize in delay of services, agree with follow up at SNF, thanks.)   Macario Golds 05/01/2018, 2:06 PM   Luanna Salk, [place] [facility] SLP [place] Pager [phone] Office [phone]

## 2018-05-01 NOTE — Discharge Summary (Signed)
Physician Discharge Summary  Ritika Hellickson Locastro LHT:[id number] DOB: February 24, 1924 DOA: 04/29/2018  PCP: Janifer Adie, MD  Admit date: 04/29/2018 Discharge date: 05/01/2018  Time spent: 35 minutes  Recommendations for Outpatient Follow-up:  1. Follow up outpatient CBC/CMP (attention to H/H and creatinine) 2. Ensure orthopedic follow up 3. Follow pain control.  Scheduled tylenol for 3 days, then resume as needed tylenol.  Oxy 2.5 mg prn ordered for pain as needed.  On discussion with her PACE MD, she has chronic pain and complains of chronic pain all over, which is not new (would use opiates only as needed for short period of time after this acute fracture).  She does well with redirection, based on my discussion with PACE MD. 4. Discharged on keflex for e. Coli UTI.  Follow up final susceptibilities.   5. Holding lasix on discharge with rising creatinine.  Follow volume status and creatinine/electrolytes to determine when to restart.  6. Pt changed to soft diet here after she had episode discomfort after eating, she's been doing well on this diet since that time (she notes she was on chopped diet at facility).  Would have speech follow at SNF and advance diet as recommended.    Discharge Diagnoses:  Principal Problem:   Closed fracture of greater trochanter of right femur ([place]) Active Problems:   Hypothyroidism   Dementia (HCC)   CKD (chronic kidney disease), stage IV (HCC)   Macrocytic anemia   Depression with anxiety   Hypertensive urgency   Acute lower UTI   Discharge Condition: stable  Diet recommendation: soft diet, advance as recommended by speech at [facility]  [facility] Weights   04/30/18 0035  Weight: 88.8 kg    History of present illness:  Merrily Tegeler Lanzer is Devaughn Savant 84 y.o. female with medical history significant for dementia, depression, anxiety, hypothyroidism, chronic kidney disease stage IV, and chronic microcytic anemia, now presenting from her SNF for evaluation of severe pain  in the low back and buttock after Milinda Sweeney fall.  Patient reports that she was reaching for Cerissa Zeiger call bell when she fell, landing on her left side without hitting her head or losing consciousness.  She reports experiencing severe pain in the low back and buttock after the fall.  She also reports some suprapubic pain but is unable to say when this began.  She reports being in her usual state prior to the fall and denies fevers, chills, chest pain, or shortness of breath, but remarks "I am 84 years old; I don't really know."    She was admitted for Kaytlen Lightsey right hip fracture (incomplete acute appearing fracture at the base and periphery of R greater trochanter).  She was seen by orthopedics who recommended nonoperative management with weightbearing as tolerated to RLE with no active hip abduction for 6 weeks.  Follow up in 2-3 weeks for repeat x rays.  See below for additional details    Hospital Course:  1.Right greater trochanter fracture -Presents from SNF with low back and buttock pain after Kassidy Dockendorf fall -Imaging suggests incomplete acute right greater trochanter fracture - Ortho c/s, appreciate recs - recommending weightbearing as tolerated to RLE with no active hip abduction for 6 weeks.  Mobilize with PT and OT.  F/u in 2-3 weeks for repeat x rays. - Discussed DVT ppx with ortho who recommended ASA 325 mg daily x 6 weeks -Pt still with pain, but most of her pain seems chronic, continue scheduled APAP and oxycodone 2.5 mg prn for now.   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 Coli UTI, susceptibilities pending, will discharge on renally dosed keflex based on antibiogram - follow final susceptibilities   # Abdominal Discomfort: - on interview while I was in room, pt eating and then began belching frequently, c/o discomfort which seemed to have started after she ate - Speech c/s placed, but have not seen her yet.  She's been doing well on Renada Cronin soft diet.  Will discharge her on soft diet  and recommend speech to follow at the facility.  - Pt c/o abdominal pain on exam persistently over past several days. lipase elevated, but pt clinical picture not consistent with pancreatitis.  Of note, she's had elevated lipase before with negative imaging for pancreatitis.  I discussed her pain with her [facility] MD and he noted that she has chronic pain and complaints of pain.  Will defer any additional imaging at this point as she's clinically doing well and with collateral from her pace MD.  3.CKD stage IV -SCr is 2.24 today (on discussion with pace MD, he notes this is within normal for her recent range) -Renally-dose medications  4.Hypertensive urgency -improved, follow   5.Macrocytic anemia -9.3 today, downtrended with IVF, follow at facility  6.Dementia; depression with anxiety; insomnia -Continue Aricept, Namenda, Lexapro, Ativan, and trazodone  7. Hypothyroidism -Continue Synthroid  Chronic Pain: on discussion with PACE MD, he notes she has frequent pain and complaints of pain and does well with redirection.  Procedures:  none  Consultations:  ortho  Discharge Exam: Vitals:   05/01/18 0541 05/01/18 1109  BP: (!) 146/55   Pulse: 62 68  Resp: 18   Temp: 98.1 F (36.7 C)   SpO2: 92% 97%   Complains of pain.  Notes this is all over. Did well with breakfast and lunch.  General: No acute distress. Cardiovascular: Heart sounds show [facility]ingworth regular rate, and rhythm Lungs: Clear to auscultation bilaterally  Abdomen: Soft, nontender, nondistended  Neurological: Alert and oriented. Moves all extremities 4. Cranial nerves II through XII grossly intact. Skin: Warm and dry. No rashes or lesions. Extremities: RLE externally rotated Psychiatric: Mood and affect are normal. Insight and judgment are appropriate.  Discharge Instructions   Discharge Instructions    Call MD for:  difficulty breathing, headache or visual disturbances   Complete by:  As  directed    Call MD for:  extreme fatigue   Complete by:  As directed    Call MD for:  persistant dizziness or light-headedness   Complete by:  As directed    Call MD for:  persistant nausea and vomiting   Complete by:  As directed    Call MD for:  redness, tenderness, or signs of infection (pain, swelling, redness, odor or green/yellow discharge around incision site)   Complete by:  As directed    Call MD for:  severe uncontrolled pain   Complete by:  As directed    Call MD for:  temperature >100.4   Complete by:  As directed    Discharge diet:   Complete by:  As directed    Soft diet, please follow up with speech at facility   Discharge instructions   Complete by:  As directed    You were seen for Skyra Crichlow hip fracture.  This will be non operative in management per discussion with ortho.  You are weight bearing as tolerated (without active abduction).  Continue aspirin for 6 weeks for DVT prophylaxis.   We are treating  you for Aryon Nham UTI with keflex.  Please follow up on the final pending culture results with your PCP.  Return for new, recurrent, or worsening symptoms.  Please ask your PCP to request records from this hospitalization so they know what was done and what the next steps will be.   Increase activity slowly   Complete by:  As directed    Weight bearing as tolerated   Complete by:  As directed    weightbearing as tolerated to the right lower extremity with no active hip abduction for 6 weeks   Laterality:  right   Extremity:  Lower     Allergies as of 05/01/2018      Reactions   Diazepam    REACTION: Unknown reaction   Penicillins    REACTION: rash, itching Has patient had Atianna Haidar PCN reaction causing immediate rash, facial/tongue/throat swelling, SOB or lightheadedness with hypotension: Yes Has patient had Annalaya Wile PCN reaction causing severe rash involving mucus membranes or skin necrosis: Unknown Has patient had Lexxie Winberg PCN reaction that required hospitalization: Unknown Has  patient had Saeed Toren PCN reaction occurring within the last 10 years: Unknown If all of the above answers are "NO", then may proceed with Cephalosporin use.   Sulfonamide Derivatives    REACTION: GI upset      Medication List    STOP taking these medications   acetaminophen 650 MG CR tablet Commonly known as:  TYLENOL Replaced by:  acetaminophen 500 MG tablet   furosemide 20 MG tablet Commonly known as:  LASIX     TAKE these medications   acetaminophen 500 MG tablet Commonly known as:  TYLENOL Take 2 tablets (1,000 mg total) by mouth every 8 (eight) hours for 3 days. (after scheduling tylenol for 3 days, resume as needed dosing) Replaces:  acetaminophen 650 MG CR tablet   aspirin 325 MG tablet Take 1 tablet (325 mg total) by mouth daily. Start taking on:  05/02/2018   BIOFREEZE EX Apply 1 application topically 3 (three) times daily. 3% Apply to knees   calcium carbonate 1250 (500 Ca) MG tablet Commonly known as:  OS-CAL - dosed in mg of elemental calcium Take 1 tablet (500 mg of elemental calcium total) by mouth daily with breakfast.   cephALEXin 250 MG capsule Commonly known as:  KEFLEX Take 1 capsule (250 mg total) by mouth 2 (two) times daily for 5 days. (starting tonight)   Cholecalciferol 25 MCG (1000 UT) capsule Take 1,000 Units by mouth daily.   donepezil 10 MG tablet Commonly known as:  ARICEPT Take 10 mg by mouth at bedtime.   escitalopram 20 MG tablet Commonly known as:  LEXAPRO Take 20 mg by mouth daily.   feeding supplement (ENSURE ENLIVE) Liqd Take 237 mLs by mouth 2 (two) times daily between meals.   iron polysaccharides 150 MG capsule Commonly known as:  NIFEREX Take 150 mg 2 (two) times daily by mouth.   levothyroxine 25 MCG tablet Commonly known as:  SYNTHROID, LEVOTHROID Take 25 mcg by mouth daily before breakfast.   LORazepam 0.5 MG tablet Commonly known as:  ATIVAN Take 0.125 mg by mouth 2 (two) times daily.   memantine 10 MG  tablet Commonly known as:  NAMENDA Take 10 mg by mouth 2 (two) times daily.   multivitamin with minerals tablet Take 1 tablet by mouth daily.   nystatin powder Generic drug:  nystatin Apply 1 Bottle topically 3 (three) times daily as needed (skin irritation).   omeprazole 40 MG capsule Commonly  known as:  PRILOSEC Take 40 mg by mouth 2 (two) times daily.   oxyCODONE 5 MG immediate release tablet Commonly known as:  Oxy IR/ROXICODONE Take 0.5 tablets (2.5 mg total) by mouth every 8 (eight) hours as needed for up to 5 days for severe pain.   pantoprazole 40 MG tablet Commonly known as:  PROTONIX Take 40 mg by mouth daily.   phenazopyridine 200 MG tablet Commonly known as:  PYRIDIUM Take 200 mg by mouth daily as needed for pain (painful urination).   senna 8.6 MG tablet Commonly known as:  SENOKOT Take 1 tablet by mouth 2 (two) times daily.   solifenacin 5 MG tablet Commonly known as:  VESICARE Take 5 mg by mouth daily.   SYSTANE 0.4-0.3 % Soln Generic drug:  Polyethyl Glycol-Propyl Glycol Apply 1 drop to eye 2 (two) times daily as needed (dry eyes).   traZODone 50 MG tablet Commonly known as:  DESYREL Take 50 mg by mouth at bedtime.            Discharge Care Instructions  (From admission, onward)         Start     Ordered   05/01/18 0000  Weight bearing as tolerated    Comments:  weightbearing as tolerated to the right lower extremity with no active hip abduction for 6 weeks  Question Answer Comment  Laterality right   Extremity Lower      05/01/18 1253         Allergies  Allergen Reactions  . Diazepam     REACTION: Unknown reaction  . Penicillins     REACTION: rash, itching Has patient had Barron Vanloan PCN reaction causing immediate rash, facial/tongue/throat swelling, SOB or lightheadedness with hypotension: Yes Has patient had Syd Newsome PCN reaction causing severe rash involving mucus membranes or skin necrosis: Unknown Has patient had Alyra Patty PCN reaction that  required hospitalization: Unknown Has patient had Navarre Diana PCN reaction occurring within the last 10 years: Unknown If all of the above answers are "NO", then may proceed with Cephalosporin use.   . Sulfonamide Derivatives     REACTION: GI upset      The results of significant diagnostics from this hospitalization (including imaging, microbiology, ancillary and laboratory) are listed below for reference.    Significant Diagnostic Studies: Dg Lumbar Spine Complete  Result Date: 04/29/2018 CLINICAL DATA:  Bilateral leg pain and abdominal pain. Prior fall. EXAM: LUMBAR SPINE - COMPLETE 4+ VIEW COMPARISON:  CT abdomen 12/07/2017 FINDINGS: Prominent bony demineralization with resulting ill definition of some of the visualized cortex, for example along the upper sacrum and iliac bones. Levoconvex lumbar scoliosis. Dense atherosclerotic calcification particularly of the aortoiliac tree. Grade 2 anterolisthesis at L4-5 with loss of disc height as shown on the 12/07/2017 exam. Patient has had prior laminectomy at L4-5. IMPRESSION: 1. No acute lumbar spine findings. Grade 2 anterolisthesis at L4-5 is chronic. 2. Significantly reduced sensitivity due to bony demineralization causing poor definition of cortical margins. If there is Doil Kamara high clinical index of suspicion of occult fracture, lumbar spine CT or MRI would be recommended. 3. Levoconvex lumbar scoliosis. 4. Lower lumbar spondylosis and degenerative disc disease. Postoperative findings at L4-5. 5. Atherosclerosis. Electronically Signed   By: Van Clines M.D.   On: 04/29/2018 20:02   Dg Pelvis 1-2 Views  Result Date: 04/29/2018 CLINICAL DATA:  Bilateral leg pain, abdominal pain status post fall EXAM: PELVIS - 1-2 VIEW COMPARISON:  None. FINDINGS: Generalized osteopenia. Slight cortical irregularity along the peripheral  aspect of the right greater trochanter which may be projectional versus Baker [place] isolated nondisplaced right greater trochanteric fracture. No  other fracture or dislocation. Prior ORIF of Tamee Battin right femoral neck fracture. Mild osteoarthritis of bilateral SI joints. IMPRESSION: Slight cortical irregularity along the peripheral aspect of the right greater trochanter which may be projectional versus Jarious Lyon isolated nondisplaced right greater trochanteric fracture. Correlate with point tenderness. Electronically Signed   By: Kathreen Devoid   On: 04/29/2018 20:13   Ct Head Wo Contrast  Result Date: 04/30/2018 CLINICAL DATA:  [place] yesterday, struck head. History of diabetes and dementia. EXAM: CT HEAD WITHOUT CONTRAST TECHNIQUE: Contiguous axial images were obtained from the base of the skull through the vertex without intravenous contrast. COMPARISON:  CT HEAD December 18, 2016 FINDINGS: BRAIN: No intraparenchymal hemorrhage, mass effect nor midline shift. The ventricles and sulci are normal for age. Patchy supratentorial white matter hypodensities. Old small RIGHT cerebellar infarct. No acute large vascular territory infarcts. No abnormal extra-axial fluid collections. Basal cisterns are patent. VASCULAR: Moderate calcific atherosclerosis of the carotid siphons. SKULL: No skull fracture. Osteopenia. No significant scalp soft tissue swelling. SINUSES/ORBITS: Paranasal sinuses are well aerated. Mastoid air cells are well aerated.The included ocular globes and orbital contents are non-suspicious. Status post bilateral ocular lens implants. OTHER: None. IMPRESSION: 1. No acute intracranial process. 2. Moderate to severe chronic small vessel ischemic changes and old small RIGHT cerebellar infarct. Electronically Signed   By: Elon Alas M.D.   On: 04/30/2018 20:43   Ct Lumbar Spine Wo Contrast  Result Date: 04/29/2018 CLINICAL DATA:  Fall, low back pain.  Follow-up abnormal radiograph. EXAM: CT LUMBAR SPINE WITHOUT CONTRAST TECHNIQUE: Multidetector CT imaging of the lumbar spine was performed without intravenous contrast administration. Multiplanar CT image  reconstructions were also generated. COMPARISON:  Lumbar spine radiographs April 29, 2018 FINDINGS: SEGMENTATION: For the purposes of this report the last well-formed intervertebral disc space is reported as L5-S1. ALIGNMENT: Maintained lumbar lordosis. Grade 2 L4-5 anterolisthesis. Mild broad levoscoliosis. VERTEBRAE: Vertebral bodies intact. Severe L4-5 disc height loss with vacuum disc, mild at L5-S1 compatible with degenerative discs. Remaining disc heights preserved. Severe osteopenia without destructive bony lesions. No destructive bony lesions. Localizer densities LEFT hip pinning. PARASPINAL AND OTHER SOFT TISSUES: Nonacute. Small hiatal hernia. Status post cholecystectomy. Severe calcific atherosclerosis aortoiliac vessels. Colonic diverticulosis. DISC LEVELS: T12-L1, L1-2 No disc bulge, canal stenosis nor neural foraminal narrowing. Moderate L1-2 facet arthropathy. L2-3: Small broad-based disc osteophyte complex asymmetric to LEFT. Moderate facet arthropathy and ligamentum flavum redundancy without canal stenosis. Minimal LEFT neural foraminal narrowing. L3-4: Moderate broad-based disc osteophyte complex. Posterior decompression with moderate to severe facet arthropathy. No canal stenosis. Severe bilateral neural foraminal narrowing. L4-5: Anterolisthesis. RIGHT hemilaminectomy. Severe facet arthropathy without canal stenosis. Severe bilateral neural foraminal narrowing. L5-S1: Small broad-based disc osteophyte complex. Severe RIGHT and moderate LEFT facet arthropathy. No canal stenosis. Moderate LEFT neural foraminal narrowing. IMPRESSION: 1. No fracture though is sensitivity decreased by severe osteopenia. 2. Grade 2 L4-5 anterolisthesis, status post laminectomy. L3-4 posterior decompression. 3. No canal stenosis. Neural foraminal narrowing L2-3 through L5-S1: Severe at L3-4 and L4-5. Electronically Signed   By: Elon Alas M.D.   On: 04/29/2018 21:35   Ct Hip Right Wo Contrast  Result Date:  04/29/2018 CLINICAL DATA:  Patient fell has right hip and low back pain. EXAM: CT OF THE RIGHT HIP WITHOUT CONTRAST TECHNIQUE: Multidetector CT imaging of the right hip was performed according to the standard protocol. Multiplanar CT  image reconstructions were also generated. COMPARISON:  Pelvic radiograph 04/29/2018 FINDINGS: Bones/Joint/Cartilage Nondisplaced incomplete fractures of the base and periphery of the right greater trochanter, series 7/40 and 41 are identified. Subacute to remote inferior sacral alar fracture with sclerosis is also noted, series 4/1. The acetabulum and pubic rami as well as pubic symphysis appear intact. No femoral neck fracture is identified. Ligaments Suboptimally assessed by CT. Muscles and Tendons Mild generalized muscular atrophy about the right hip. No intramuscular hemorrhage or hematoma. Soft tissues No subcutaneous hematoma. Mild soft tissue induration along the lateral aspect of the right hip. Trace right hip joint effusion. IMPRESSION: 1. Incomplete acute appearing fractures at the base and periphery of the right greater trochanter. No avulsion is noted. 2. No femoral neck fracture or joint dislocation. 3. Remote appearing right inferior sacral alar fracture. Electronically Signed   By: Ashley Royalty M.D.   On: 04/29/2018 21:39    Microbiology: Recent Results (from the past 240 hour(s))  Urine culture     Status: Abnormal (Preliminary result)   Collection Time: 04/29/18 10:17 PM  Result Value Ref Range Status   Specimen Description   Final    URINE, CLEAN CATCH Performed at [facility], [place] [address], [place], [place] [address]    Special Requests   Final    NONE Performed at [facility], [place] [address], [place], [place] [address]    Culture >=100,000 COLONIES/mL ESCHERICHIA COLI (Kiyah Demartini)  Final   Report Status PENDING  Incomplete     Labs: Basic Metabolic Panel: Recent Labs  Lab 04/29/18 2031 04/30/18 0527  05/01/18 0445  NA 142 140 137  K 4.5 4.4 4.5  CL 108 105 106  CO2 26 25 26   GLUCOSE 111* 162* 126*  BUN 38* 37* 37*  CREATININE 2.14* 1.88* 2.24*  CALCIUM 9.3 9.1 8.6*  MG  --   --  1.8   Liver Function Tests: Recent Labs  Lab 04/29/18 2031 05/01/18 0445  AST 27 23  ALT 20 18  ALKPHOS 87 66  BILITOT 0.5 0.4  PROT 7.1 6.3*  ALBUMIN 3.2* 2.8*   Recent Labs  Lab 05/01/18 0445  LIPASE 176*   No results for input(s): AMMONIA in the last 168 hours. CBC: Recent Labs  Lab 04/29/18 2031 04/30/18 0527 05/01/18 0445 05/01/18 1152  WBC 9.3 9.7 6.3  --   NEUTROABS 6.5 7.0  --   --   HGB 10.0* 10.2* 8.4* 9.3*  HCT 32.9* 33.5* 28.4* 31.6*  MCV 108.6* 108.4* 109.2*  --   PLT 199 182 174  --    Cardiac Enzymes: No results for input(s): CKTOTAL, CKMB, CKMBINDEX, TROPONINI in the last 168 hours. BNP: BNP (last 3 results) No results for input(s): BNP in the last 8760 hours.  ProBNP (last 3 results) No results for input(s): PROBNP in the last 8760 hours.  CBG: No results for input(s): GLUCAP in the last 168 hours.     Signed:  Fayrene Helper MD.  [facility] 05/01/2018, 1:13 PM

## 2018-05-01 NOTE — Progress Notes (Signed)
Patient returning back to [facility].  CSW discussed patient discharge with [facility] with social worker Lelon Frohlich. She reports the patient will be transported by their [facility] personal transportation. 2:00 pick up.  CSW informed patient granddaughter Levada Dy.   Kathrin Greathouse, Marlinda Mike, MSW Clinical Social Worker  [phone] 05/01/2018  12:51 PM

## 2018-05-02 LAB — URINE CULTURE: Culture: >=100000 — AB

## 2018-06-18 ENCOUNTER — Ambulatory Visit (HOSPITAL_COMMUNITY)
Admission: RE | Admit: 2018-06-18 | Discharge: 2018-06-18 | Disposition: A | Discharge status: Home / Self Care | Payer: Medicare (Managed Care) | Source: Ambulatory Visit | Attending: Family Medicine | Admitting: Family Medicine

## 2018-06-18 ENCOUNTER — Other Ambulatory Visit (HOSPITAL_COMMUNITY): Payer: Self-pay | Admitting: Family Medicine

## 2018-06-18 DIAGNOSIS — R059 Cough, unspecified: Secondary | ICD-10-CM

## 2018-06-18 DIAGNOSIS — R05 Cough: Secondary | ICD-10-CM | POA: Insufficient documentation

## 2018-10-12 ENCOUNTER — Other Ambulatory Visit (HOSPITAL_COMMUNITY): Payer: Self-pay | Admitting: *Deleted

## 2018-10-13 ENCOUNTER — Ambulatory Visit (HOSPITAL_COMMUNITY)
Admission: RE | Admit: 2018-10-13 | Discharge: 2018-10-13 | Disposition: A | Discharge status: Home / Self Care | Payer: Medicare (Managed Care) | Source: Ambulatory Visit | Attending: Family Medicine | Admitting: Family Medicine

## 2018-10-13 ENCOUNTER — Other Ambulatory Visit: Payer: Self-pay

## 2018-10-13 DIAGNOSIS — D539 Nutritional anemia, unspecified: Secondary | ICD-10-CM | POA: Diagnosis present

## 2018-10-13 DIAGNOSIS — D509 Iron deficiency anemia, unspecified: Secondary | ICD-10-CM | POA: Insufficient documentation

## 2018-10-13 MED ORDER — SODIUM CHLORIDE 0.9 % IV SOLN
510.0000 mg | INTRAVENOUS | Status: DC
  Administered 2018-10-13: 510 mg via INTRAVENOUS
  Filled 2018-10-13: qty 510

## 2018-10-13 NOTE — Discharge Instructions (Signed)

## 2018-10-19 ENCOUNTER — Other Ambulatory Visit (HOSPITAL_COMMUNITY): Payer: Self-pay | Admitting: *Deleted

## 2018-10-20 ENCOUNTER — Other Ambulatory Visit: Payer: Self-pay

## 2018-10-20 ENCOUNTER — Ambulatory Visit (HOSPITAL_COMMUNITY)
Admission: RE | Admit: 2018-10-20 | Discharge: 2018-10-20 | Disposition: A | Discharge status: Home / Self Care | Payer: Medicare (Managed Care) | Source: Ambulatory Visit | Attending: Family Medicine | Admitting: Family Medicine

## 2018-10-20 DIAGNOSIS — D509 Iron deficiency anemia, unspecified: Secondary | ICD-10-CM | POA: Diagnosis not present

## 2018-10-20 MED ORDER — SODIUM CHLORIDE 0.9 % IV SOLN
510.0000 mg | INTRAVENOUS | Status: DC
  Administered 2018-10-20: 510 mg via INTRAVENOUS
  Filled 2018-10-20: qty 510

## 2018-11-14 IMAGING — CT CT HEAD W/O CM
3 of 8 series · 13 of 47 positions shown, 15 images · non-contrast
Comparison: CT head dated 09/11/2014

CLINICAL DATA: Fall

EXAM:
CT HEAD WITHOUT CONTRAST
CT CERVICAL SPINE WITHOUT CONTRAST
TECHNIQUE: Multidetector CT imaging of the head and cervical spine was
performed following the standard protocol without intravenous
contrast. Multiplanar CT image reconstructions of the cervical spine
were also generated.

[Series 11: axial recon · axial · 0.23mm/px · z∈[-366,-196]mm · 8 of 112 slices shown, 10 images]
[im 11/112  brain]
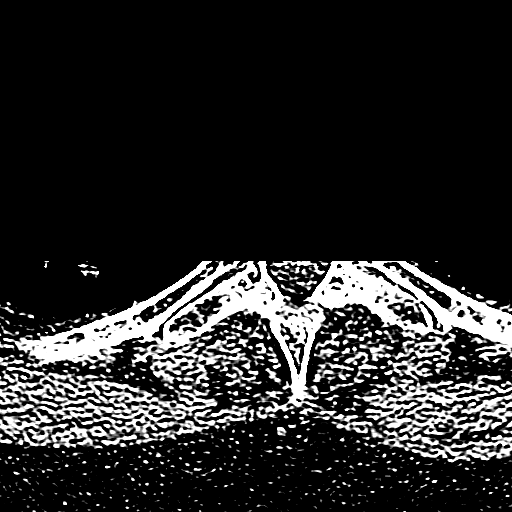
[im 11/112  bone]
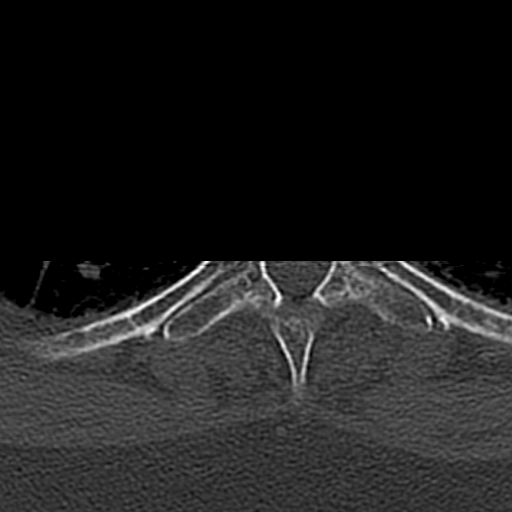
[im 21/112  brain]
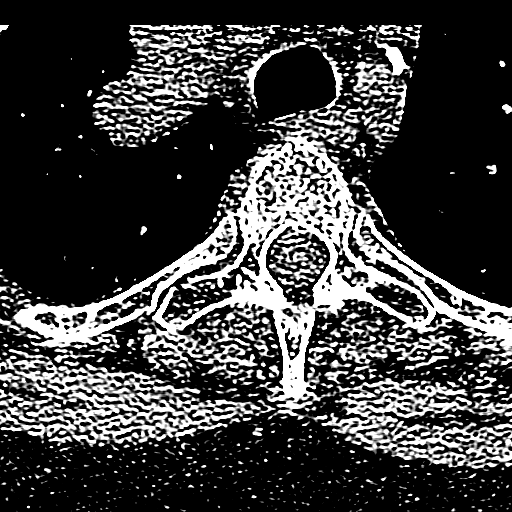
[im 41/112  brain]
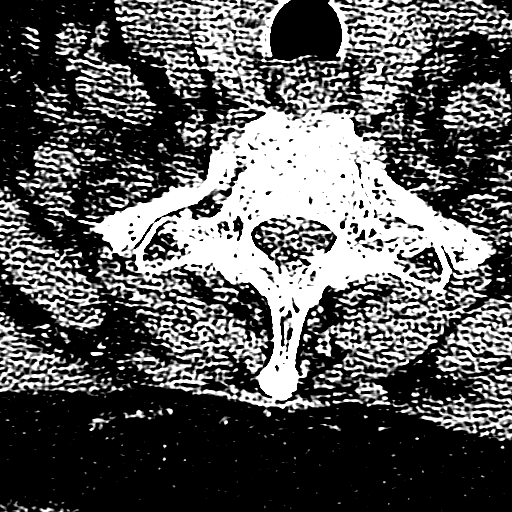
[im 51/112  brain]
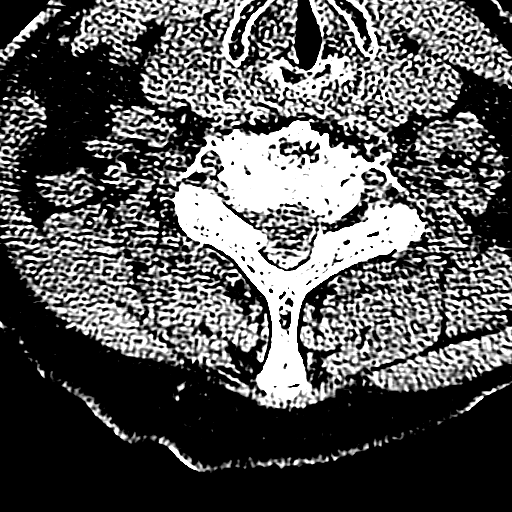
[im 61/112  brain]
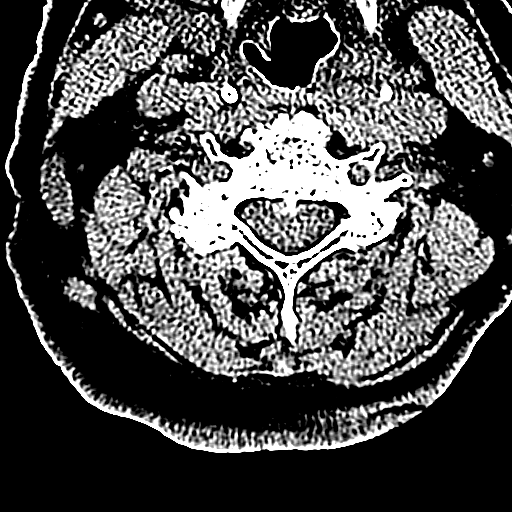
[im 61/112  bone]
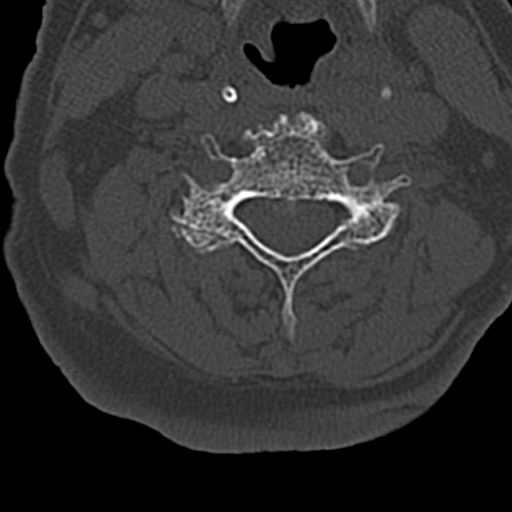
[im 71/112  brain]
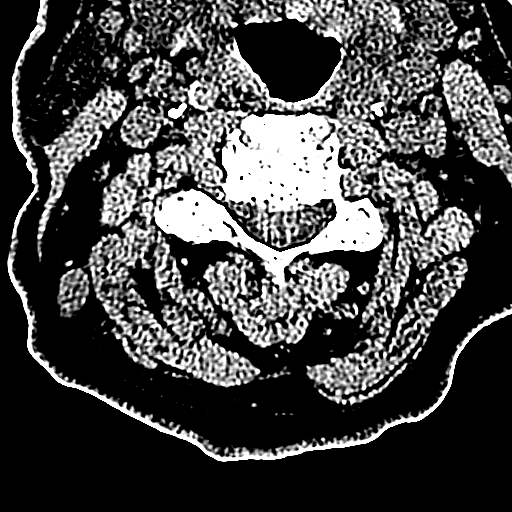
[im 91/112  brain]
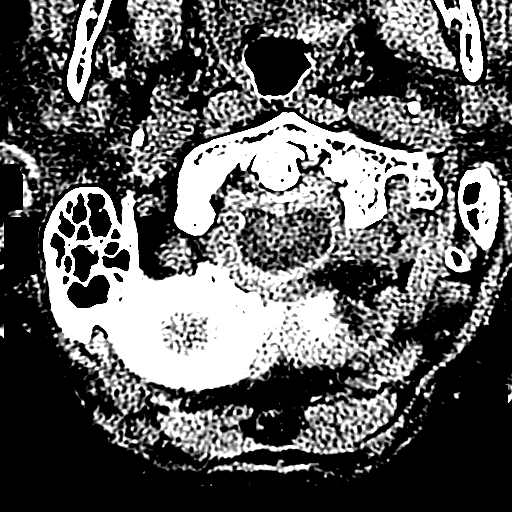
[im 101/112  brain]
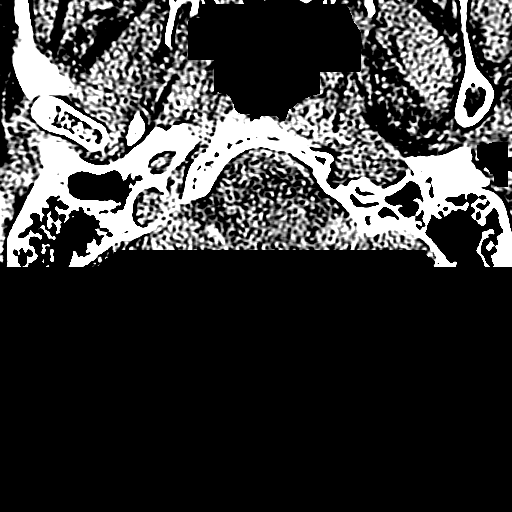

[Series 12: coronal · coronal · 0.23mm/px · 3 of 61 slices shown]
[im 41/61  brain]
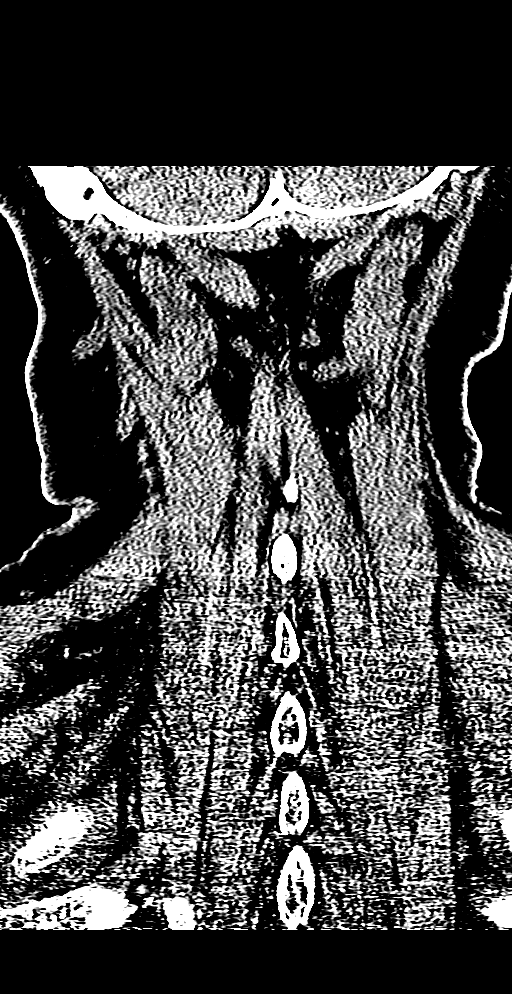
[im 46/61  brain]
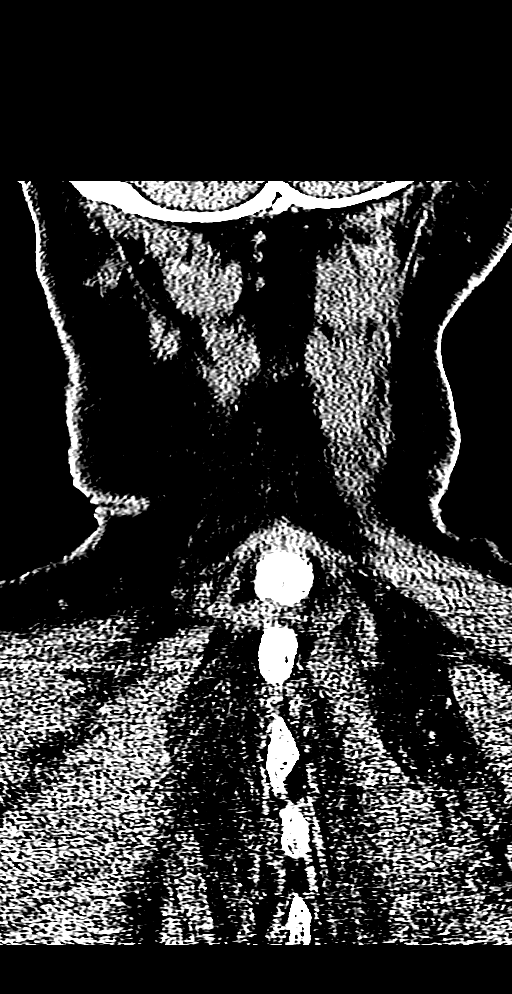
[im 51/61  brain]
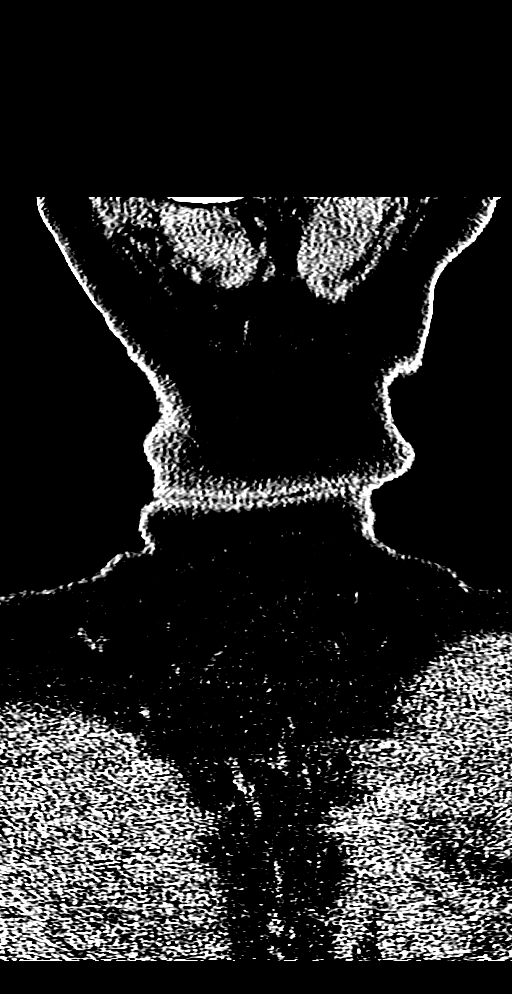

[Series 13: sagittal · sagittal · 0.23mm/px · 2 of 61 slices shown]
[im 21/61  brain]
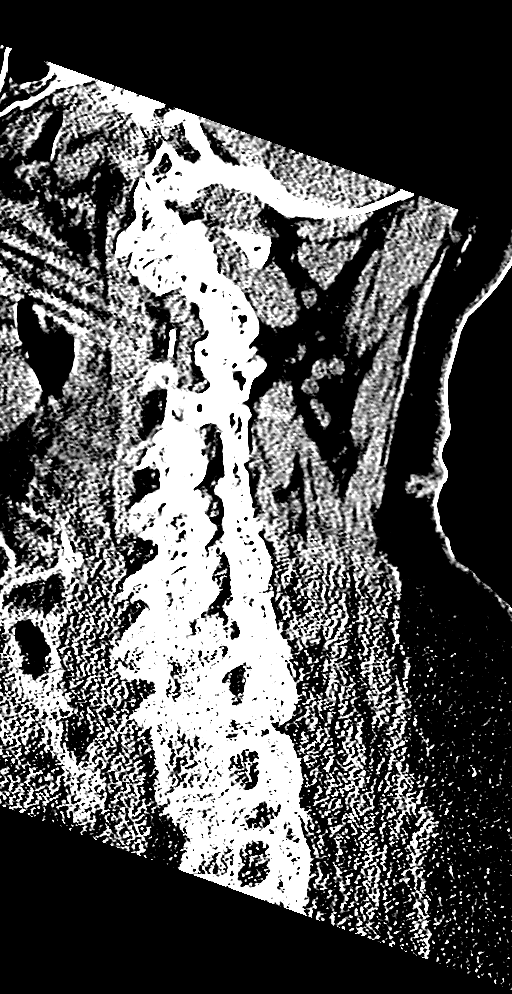
[im 41/61  brain]
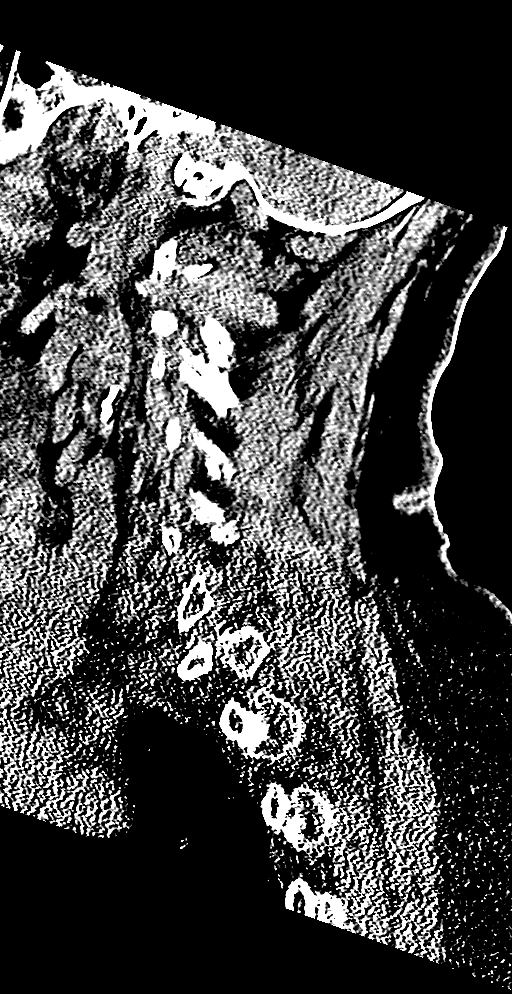

[13 of 47 positions shown; findings below may reference images not displayed]

FINDINGS: CT HEAD FINDINGS

Brain: No evidence of acute infarction, hemorrhage, hydrocephalus,
extra-axial collection or mass lesion/mass effect.

Global cortical atrophy. Subcortical white matter and
periventricular small vessel ischemic changes.

Vascular: Intracranial atherosclerosis.

Skull: Normal. Negative for fracture or focal lesion.

Sinuses/Orbits: The visualized paranasal sinuses are essentially
clear. The mastoid air cells are unopacified.

Other: None.

CT CERVICAL SPINE FINDINGS

Alignment: Mild straightening of the cervical spine, likely
positional.

Skull base and vertebrae: No acute fracture. No primary bone lesion
or focal pathologic process.

Soft tissues and spinal canal: No prevertebral fluid or swelling. No
visible canal hematoma.

Disc levels:  Mild multilevel degenerative changes.

Spinal canal is patent.

Upper chest: Visualized lung apices are clear.

Other: Visualized thyroid is grossly unremarkable.
IMPRESSION: No evidence of acute intracranial abnormality. Atrophy with small
vessel ischemic changes.

No evidence of traumatic injury to the cervical spine. Mild
multilevel degenerative changes.

## 2018-11-15 IMAGING — DX DG TIBIA/FIBULA PORT 2V*L*
4 series · 4 of 4 positions shown · non-contrast
Comparison: Intra op films from earlier the same day.

CLINICAL DATA: Status post intramedullary nail placement.

EXAM:
PORTABLE LEFT TIBIA AND FIBULA - 2 VIEW

[tibia ap (1 of 3)]
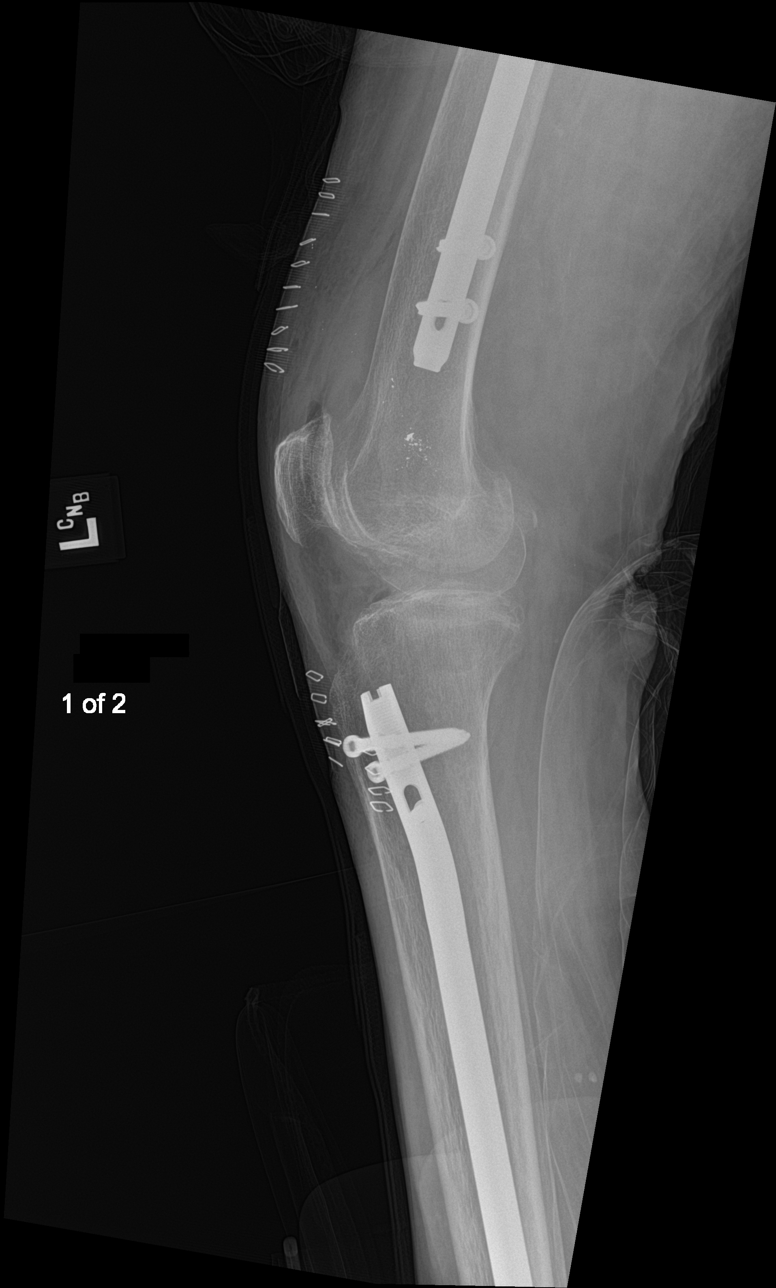

[tibia lat]
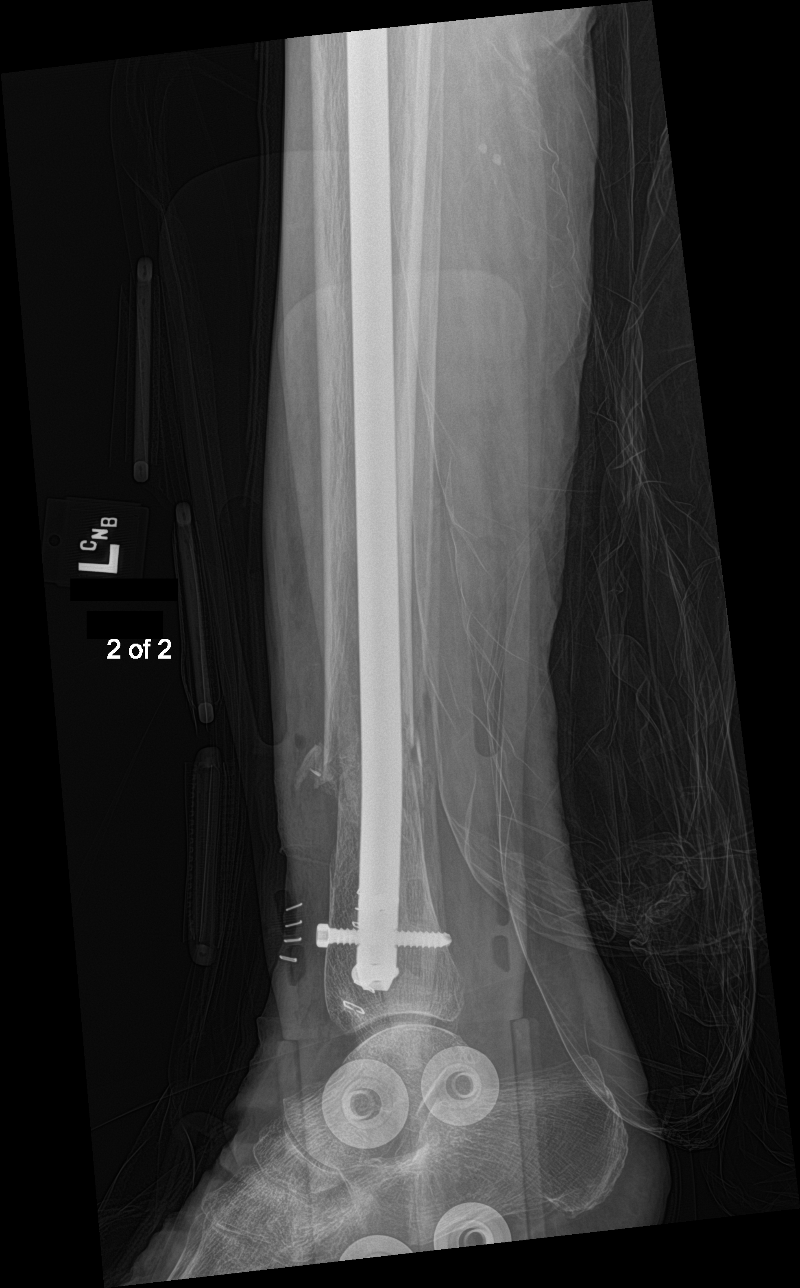

[tibia ap (2 of 3)]
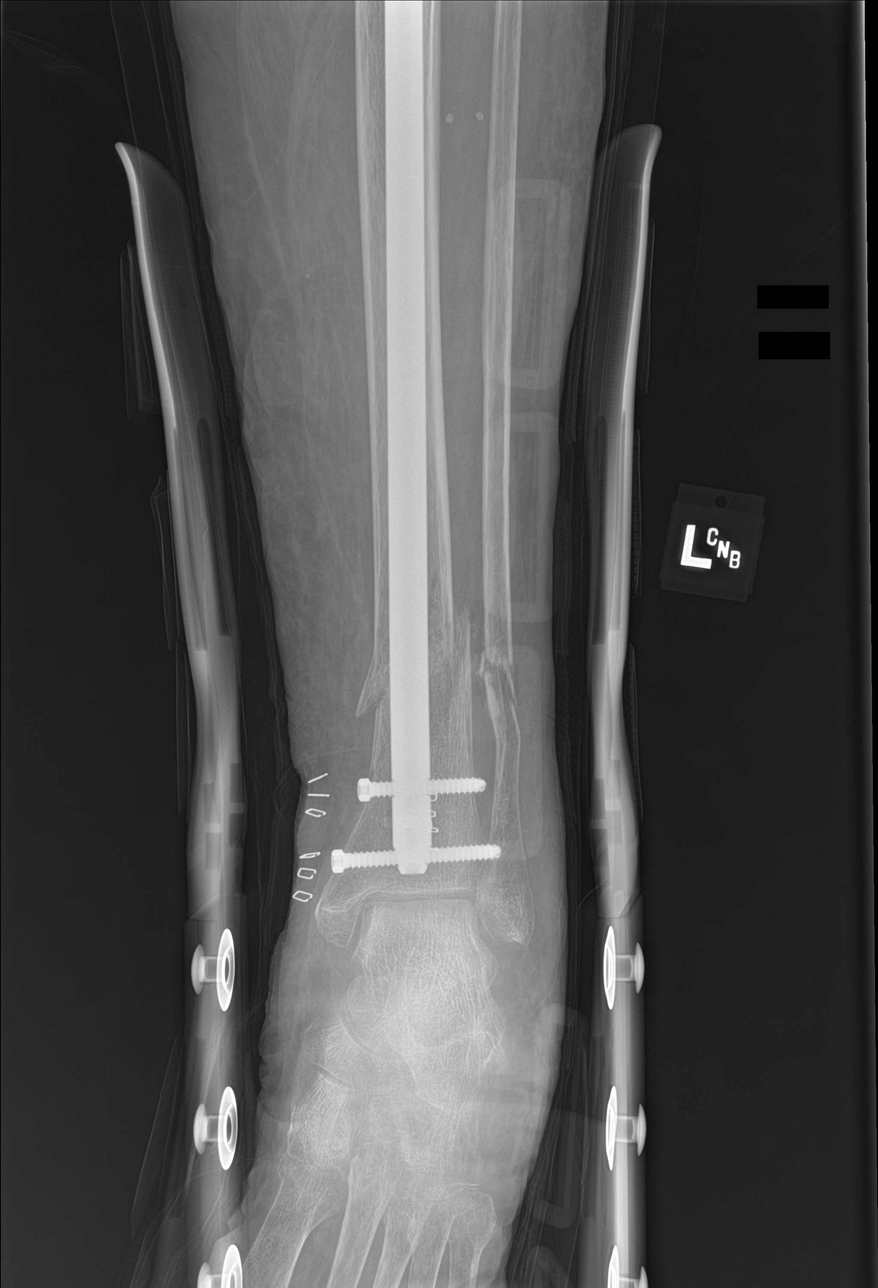

[tibia ap (3 of 3)]
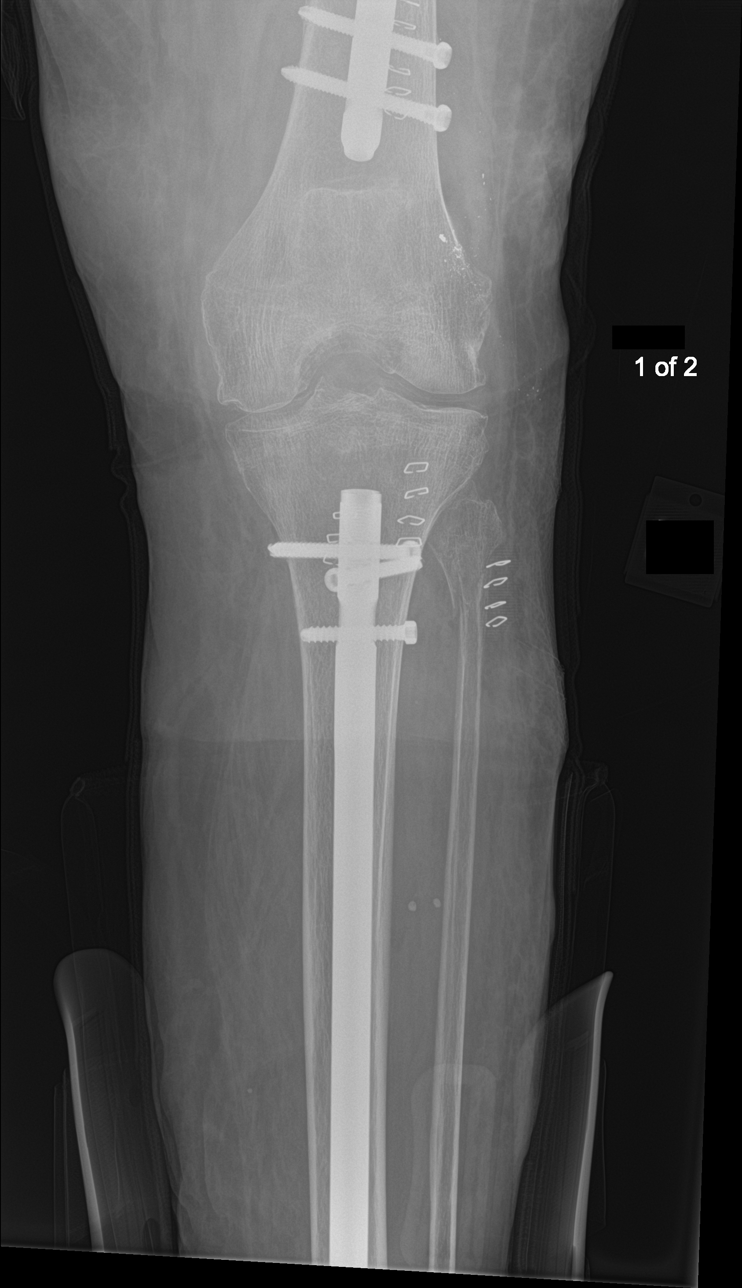

[4 of 4 positions shown; findings below may reference images not displayed]

FINDINGS: Two-view portable study shows the patient to be status post
antegrade IM nail placement in the tibia with 3 proximal and 2
distal interlocking screws. Nail crosses oblique fracture the distal
tibial metadiaphysis. Fractures of the proximal and distal fibula
are evident. Distal portion of a femoral IM nail is visualized

.  IMPRESSION:
Status post antegrade tibial IM nail placement without evidence for
hardware complications.

## 2018-11-18 ENCOUNTER — Other Ambulatory Visit (HOSPITAL_COMMUNITY): Payer: Self-pay | Admitting: *Deleted

## 2018-11-19 ENCOUNTER — Inpatient Hospital Stay (HOSPITAL_COMMUNITY): Admission: RE | Admit: 2018-11-19 | Payer: Medicare (Managed Care) | Source: Ambulatory Visit

## 2018-11-26 ENCOUNTER — Encounter (HOSPITAL_COMMUNITY): Payer: Medicare (Managed Care)

## 2020-01-04 ENCOUNTER — Other Ambulatory Visit (HOSPITAL_COMMUNITY): Payer: Self-pay | Admitting: *Deleted

## 2020-01-05 ENCOUNTER — Ambulatory Visit (HOSPITAL_COMMUNITY)
Admission: RE | Admit: 2020-01-05 | Discharge: 2020-01-05 | Disposition: A | Discharge status: Home / Self Care | Payer: Medicare (Managed Care) | Source: Ambulatory Visit | Attending: Family Medicine | Admitting: Family Medicine

## 2020-01-05 ENCOUNTER — Other Ambulatory Visit: Payer: Self-pay

## 2020-01-05 DIAGNOSIS — D509 Iron deficiency anemia, unspecified: Secondary | ICD-10-CM | POA: Diagnosis present

## 2020-01-05 MED ORDER — SODIUM CHLORIDE 0.9 % IV SOLN
510.0000 mg | INTRAVENOUS | Status: DC
  Administered 2020-01-05: 510 mg via INTRAVENOUS
  Filled 2020-01-05: qty 17

## 2020-01-05 NOTE — Discharge Instructions (Signed)

## 2020-01-12 ENCOUNTER — Inpatient Hospital Stay (HOSPITAL_COMMUNITY): Admission: RE | Admit: 2020-01-12 | Payer: Medicare (Managed Care) | Source: Ambulatory Visit

## 2020-06-15 ENCOUNTER — Other Ambulatory Visit (HOSPITAL_COMMUNITY): Payer: Self-pay | Admitting: *Deleted

## 2020-06-16 ENCOUNTER — Encounter (HOSPITAL_COMMUNITY)
Admission: RE | Admit: 2020-06-16 | Discharge: 2020-06-16 | Disposition: A | Discharge status: Home / Self Care | Payer: Medicare (Managed Care) | Source: Ambulatory Visit | Attending: Nurse Practitioner | Admitting: Nurse Practitioner

## 2020-06-16 ENCOUNTER — Other Ambulatory Visit: Payer: Self-pay

## 2020-06-16 DIAGNOSIS — D539 Nutritional anemia, unspecified: Secondary | ICD-10-CM | POA: Insufficient documentation

## 2020-06-16 MED ORDER — SODIUM CHLORIDE 0.9 % IV SOLN
510.0000 mg | INTRAVENOUS | Status: DC
  Administered 2020-06-16: 510 mg via INTRAVENOUS
  Filled 2020-06-16: qty 17

## 2020-06-22 ENCOUNTER — Other Ambulatory Visit (HOSPITAL_COMMUNITY): Payer: Self-pay

## 2020-06-23 ENCOUNTER — Inpatient Hospital Stay (HOSPITAL_COMMUNITY): Admission: RE | Admit: 2020-06-23 | Payer: Medicare (Managed Care) | Source: Ambulatory Visit

## 2021-05-23 ENCOUNTER — Inpatient Hospital Stay (HOSPITAL_COMMUNITY)
Admission: EM | Admit: 2021-05-23 | Discharge: 2021-06-10 | DRG: 871 | Disposition: E | Payer: Medicare (Managed Care) | Attending: Student in an Organized Health Care Education/Training Program | Admitting: Student in an Organized Health Care Education/Training Program

## 2021-05-23 ENCOUNTER — Emergency Department (HOSPITAL_COMMUNITY): Payer: Medicare (Managed Care)

## 2021-05-23 DIAGNOSIS — U071 COVID-19: Secondary | ICD-10-CM | POA: Diagnosis present

## 2021-05-23 DIAGNOSIS — F419 Anxiety disorder, unspecified: Secondary | ICD-10-CM | POA: Diagnosis present

## 2021-05-23 DIAGNOSIS — J9601 Acute respiratory failure with hypoxia: Secondary | ICD-10-CM | POA: Diagnosis present

## 2021-05-23 DIAGNOSIS — G9341 Metabolic encephalopathy: Secondary | ICD-10-CM | POA: Diagnosis present

## 2021-05-23 DIAGNOSIS — Z66 Do not resuscitate: Secondary | ICD-10-CM | POA: Diagnosis present

## 2021-05-23 DIAGNOSIS — Z515 Encounter for palliative care: Secondary | ICD-10-CM

## 2021-05-23 DIAGNOSIS — J1282 Pneumonia due to coronavirus disease 2019: Secondary | ICD-10-CM | POA: Diagnosis present

## 2021-05-23 DIAGNOSIS — E1122 Type 2 diabetes mellitus with diabetic chronic kidney disease: Secondary | ICD-10-CM | POA: Diagnosis present

## 2021-05-23 DIAGNOSIS — R6521 Severe sepsis with septic shock: Secondary | ICD-10-CM | POA: Diagnosis present

## 2021-05-23 DIAGNOSIS — E875 Hyperkalemia: Secondary | ICD-10-CM | POA: Diagnosis present

## 2021-05-23 DIAGNOSIS — Z79890 Hormone replacement therapy (postmenopausal): Secondary | ICD-10-CM | POA: Diagnosis not present

## 2021-05-23 DIAGNOSIS — Z79899 Other long term (current) drug therapy: Secondary | ICD-10-CM

## 2021-05-23 DIAGNOSIS — K219 Gastro-esophageal reflux disease without esophagitis: Secondary | ICD-10-CM | POA: Diagnosis present

## 2021-05-23 DIAGNOSIS — E872 Acidosis, unspecified: Secondary | ICD-10-CM | POA: Diagnosis present

## 2021-05-23 DIAGNOSIS — A4189 Other specified sepsis: Principal | ICD-10-CM | POA: Diagnosis present

## 2021-05-23 DIAGNOSIS — E039 Hypothyroidism, unspecified: Secondary | ICD-10-CM | POA: Diagnosis present

## 2021-05-23 DIAGNOSIS — A419 Sepsis, unspecified organism: Secondary | ICD-10-CM | POA: Diagnosis present

## 2021-05-23 DIAGNOSIS — J189 Pneumonia, unspecified organism: Secondary | ICD-10-CM

## 2021-05-23 DIAGNOSIS — G9340 Encephalopathy, unspecified: Secondary | ICD-10-CM | POA: Diagnosis present

## 2021-05-23 DIAGNOSIS — F32A Depression, unspecified: Secondary | ICD-10-CM | POA: Diagnosis present

## 2021-05-23 DIAGNOSIS — N184 Chronic kidney disease, stage 4 (severe): Secondary | ICD-10-CM | POA: Diagnosis present

## 2021-05-23 DIAGNOSIS — F0390 Unspecified dementia without behavioral disturbance: Secondary | ICD-10-CM | POA: Diagnosis present

## 2021-05-23 DIAGNOSIS — N179 Acute kidney failure, unspecified: Secondary | ICD-10-CM | POA: Diagnosis present

## 2021-05-23 DIAGNOSIS — J9691 Respiratory failure, unspecified with hypoxia: Secondary | ICD-10-CM | POA: Diagnosis present

## 2021-05-23 LAB — I-STAT CHEM 8, ED
BUN: 94 mg/dL — ABNORMAL HIGH (ref 8–23)
Calcium, Ion: 1.23 mmol/L (ref 1.15–1.40)
Chloride: 120 mmol/L — ABNORMAL HIGH (ref 98–111)
Creatinine, Ser: 4.9 mg/dL — ABNORMAL HIGH (ref 0.44–1.00)
Glucose, Bld: 155 mg/dL — ABNORMAL HIGH (ref 70–99)
HCT: 45 % (ref 36.0–46.0)
Hemoglobin: 15.3 g/dL — ABNORMAL HIGH (ref 12.0–15.0)
Potassium: 6.3 mmol/L (ref 3.5–5.1)
Sodium: 141 mmol/L (ref 135–145)
TCO2: 14 mmol/L — ABNORMAL LOW (ref 22–32)

## 2021-05-23 LAB — COMPREHENSIVE METABOLIC PANEL
ALT: 35 U/L (ref 0–44)
AST: 70 U/L — ABNORMAL HIGH (ref 15–41)
Albumin: 1.8 g/dL — ABNORMAL LOW (ref 3.5–5.0)
Alkaline Phosphatase: 201 U/L — ABNORMAL HIGH (ref 38–126)
Anion gap: 9 (ref 5–15)
BUN: 59 mg/dL — ABNORMAL HIGH (ref 8–23)
CO2: 8 mmol/L — ABNORMAL LOW (ref 22–32)
Calcium: 7.9 mg/dL — ABNORMAL LOW (ref 8.9–10.3)
Chloride: 120 mmol/L — ABNORMAL HIGH (ref 98–111)
Creatinine, Ser: 5.35 mg/dL — ABNORMAL HIGH (ref 0.44–1.00)
GFR, Estimated: 7 mL/min — ABNORMAL LOW (ref >60)
Glucose, Bld: 168 mg/dL — ABNORMAL HIGH (ref 70–99)
Potassium: 6.3 mmol/L (ref 3.5–5.1)
Sodium: 137 mmol/L (ref 135–145)
Total Bilirubin: 1 mg/dL (ref 0.3–1.2)
Total Protein: 4.6 g/dL — ABNORMAL LOW (ref 6.5–8.1)

## 2021-05-23 LAB — CBC WITH DIFFERENTIAL/PLATELET
Abs Immature Granulocytes: 0.02 10*3/uL (ref 0.00–0.07)
Basophils Absolute: 0 10*3/uL (ref 0.0–0.1)
Basophils Relative: 0 %
Eosinophils Absolute: 0 10*3/uL (ref 0.0–0.5)
Eosinophils Relative: 0 %
HCT: 39.3 % (ref 36.0–46.0)
Hemoglobin: 12.5 g/dL (ref 12.0–15.0)
Immature Granulocytes: 0 %
Lymphocytes Relative: 5 %
Lymphs Abs: 0.3 10*3/uL — ABNORMAL LOW (ref 0.7–4.0)
MCH: 33.6 pg (ref 26.0–34.0)
MCHC: 31.8 g/dL (ref 30.0–36.0)
MCV: 105.6 fL — ABNORMAL HIGH (ref 80.0–100.0)
Monocytes Absolute: 0.1 10*3/uL (ref 0.1–1.0)
Monocytes Relative: 1 %
Neutro Abs: 6.2 10*3/uL (ref 1.7–7.7)
Neutrophils Relative %: 94 %
Platelets: 278 10*3/uL (ref 150–400)
RBC: 3.72 MIL/uL — ABNORMAL LOW (ref 3.87–5.11)
RDW: 17.2 % — ABNORMAL HIGH (ref 11.5–15.5)
WBC: 6.7 10*3/uL (ref 4.0–10.5)
nRBC: 4.1 % — ABNORMAL HIGH (ref 0.0–0.2)

## 2021-05-23 LAB — RESP PANEL BY RT-PCR (FLU A&B, COVID) ARPGX2
Influenza A by PCR: NEGATIVE
Influenza B by PCR: NEGATIVE
SARS Coronavirus 2 by RT PCR: POSITIVE — AB

## 2021-05-23 LAB — CBG MONITORING, ED: Glucose-Capillary: 138 mg/dL — ABNORMAL HIGH (ref 70–99)

## 2021-05-23 LAB — LACTIC ACID, PLASMA
Lactic Acid, Venous: 3 mmol/L (ref 0.5–1.9)
Lactic Acid, Venous: 4.1 mmol/L (ref 0.5–1.9)

## 2021-05-23 LAB — TROPONIN I (HIGH SENSITIVITY)
Troponin I (High Sensitivity): 31 ng/L — ABNORMAL HIGH (ref <18)
Troponin I (High Sensitivity): 32 ng/L — ABNORMAL HIGH (ref <18)

## 2021-05-23 LAB — PROTIME-INR
INR: 1.3 — ABNORMAL HIGH (ref 0.8–1.2)
Prothrombin Time: 16.1 seconds — ABNORMAL HIGH (ref 11.4–15.2)

## 2021-05-23 MED ORDER — LACTATED RINGERS IV BOLUS
1000.0000 mL | Freq: Once | INTRAVENOUS | Status: AC
  Administered 2021-05-23: 21:00:00 1000 mL via INTRAVENOUS

## 2021-05-23 MED ORDER — HYDROMORPHONE HCL 1 MG/ML IJ SOLN
0.5000 mg | INTRAMUSCULAR | Status: DC | PRN
  Administered 2021-05-24: 0.5 mg via INTRAVENOUS
  Filled 2021-05-23: qty 1

## 2021-05-23 MED ORDER — INSULIN ASPART 100 UNIT/ML IV SOLN
5.0000 [IU] | Freq: Once | INTRAVENOUS | Status: AC
  Administered 2021-05-23: 22:00:00 5 [IU] via INTRAVENOUS

## 2021-05-23 MED ORDER — LACTATED RINGERS IV BOLUS
1000.0000 mL | Freq: Once | INTRAVENOUS | Status: AC
  Administered 2021-05-23: 1000 mL via INTRAVENOUS

## 2021-05-23 MED ORDER — VANCOMYCIN VARIABLE DOSE PER UNSTABLE RENAL FUNCTION (PHARMACIST DOSING): Status: DC

## 2021-05-23 MED ORDER — SODIUM CHLORIDE 0.9 % IV SOLN
2.0000 g | Freq: Once | INTRAVENOUS | Status: AC
  Administered 2021-05-23: 21:00:00 2 g via INTRAVENOUS
  Filled 2021-05-23: qty 2

## 2021-05-23 MED ORDER — DEXTROSE 50 % IV SOLN
1.0000 | Freq: Once | INTRAVENOUS | Status: AC
  Administered 2021-05-23: 22:00:00 50 mL via INTRAVENOUS
  Filled 2021-05-23: qty 50

## 2021-05-23 MED ORDER — LACTATED RINGERS IV SOLN: INTRAVENOUS | Status: DC

## 2021-05-23 MED ORDER — SODIUM CHLORIDE 0.9 % IV BOLUS
500.0000 mL | Freq: Once | INTRAVENOUS | Status: AC
  Administered 2021-05-23: 20:00:00 500 mL via INTRAVENOUS

## 2021-05-23 MED ORDER — ALBUTEROL SULFATE (2.5 MG/3ML) 0.083% IN NEBU
10.0000 mg | INHALATION_SOLUTION | Freq: Once | RESPIRATORY_TRACT | Status: AC
  Administered 2021-05-23: 22:00:00 10 mg via RESPIRATORY_TRACT
  Filled 2021-05-23: qty 12

## 2021-05-23 MED ORDER — ACETAMINOPHEN 650 MG RE SUPP: 650.0000 mg | Freq: Four times a day (QID) | RECTAL | Status: DC | PRN

## 2021-05-23 MED ORDER — CALCIUM GLUCONATE 10 % IV SOLN
1.0000 g | Freq: Once | INTRAVENOUS | Status: AC
  Administered 2021-05-23: 22:00:00 1 g via INTRAVENOUS
  Filled 2021-05-23: qty 10

## 2021-05-23 MED ORDER — HEPARIN SODIUM (PORCINE) 5000 UNIT/ML IJ SOLN: 5000.0000 [IU] | Freq: Three times a day (TID) | INTRAMUSCULAR | Status: DC

## 2021-05-23 MED ORDER — VANCOMYCIN HCL 1750 MG/350ML IV SOLN
1750.0000 mg | Freq: Once | INTRAVENOUS | Status: AC
  Administered 2021-05-23: 21:00:00 1750 mg via INTRAVENOUS
  Filled 2021-05-23: qty 350

## 2021-05-23 MED ORDER — ACETAMINOPHEN 325 MG PO TABS: 650.0000 mg | ORAL_TABLET | Freq: Four times a day (QID) | ORAL | Status: DC | PRN

## 2021-05-23 NOTE — Progress Notes (Signed)
Pharmacy Antibiotic Note  Alexandra Booth is a 87 y.o. female admitted on 06/04/2021 with pneumonia.  Pharmacy has been consulted for vancomycin dosing. Of note, patient received a dose of aztreonam in the ED but patient has tolerated cephalosporins multiple times in the past.   WBC wnl, LA 3, SCr 4.9 (BL ~ 1.8 - 2.2)  Plan: -Vancomycin 1750 mg IV load followed by vancomycin dosing per levels -F/u maintenance gram negative coverage -Monitor CBC, renal fx, cultures and clinical progress     No data recorded.  Recent Labs  Lab 05/16/2021 1919 05/10/2021 1928  WBC 6.7  --   CREATININE 5.35* 4.90*  LATICACIDVEN 3.0*  --     CrCl cannot be calculated (Unknown ideal weight.).    Allergies  Allergen Reactions   Diazepam     REACTION: Unknown reaction   Penicillins     REACTION: rash, itching Has patient had a PCN reaction causing immediate rash, facial/tongue/throat swelling, SOB or lightheadedness with hypotension: Yes Has patient had a PCN reaction causing severe rash involving mucus membranes or skin necrosis: Unknown Has patient had a PCN reaction that required hospitalization: Unknown Has patient had a PCN reaction occurring within the last 10 years: Unknown If all of the above answers are "NO", then may proceed with Cephalosporin use.    Sulfonamide Derivatives     REACTION: GI upset    Antimicrobials this admission: Vancomycin 12/14 >>  Aztreonam 12/14 x 1  Dose adjustments this admission:  Microbiology results:  Thank you for allowing pharmacy to be a part of this patients care.  Albertina Parr, PharmD., BCPS, BCCCP Clinical Pharmacist Please refer to [facility] for unit-specific pharmacist

## 2021-05-23 NOTE — ED Triage Notes (Signed)
Pt arrives via [facility] from [facility]. Last seen normal 12/13 2000. Left side facial droop is new. Normally Aox4. GCS currently 7. Hypotensive 80s/40s and bradycardic in 40's. Staff reports pt is DNR and comfort care. DNR present, but staff cannot get documentation for comfort care/MOST form due to computer.

## 2021-05-23 NOTE — ED Provider Notes (Signed)
[place] EMERGENCY DEPARTMENT Provider Note   CSN: [id number] Arrival date & time: 05/22/2021  1857     History Chief Complaint  Patient presents with   Altered Mental Status    Alexandra Booth is a 87 y.o. female.  HPI Reviewed with first emergency contact who is friend, Kenney Houseman. No additional family to contact.  Reports patient was diagnosed with COVID last week.  She also has nursing home had advised her she was not eating very well and had decreased activity level and mental status over the past several days.  Kenney Houseman advises the patient is DNR and would not want extensive resuscitation measures.    Past Medical History:  Diagnosis Date   Dementia ([place])    Depression with anxiety    Diabetes mellitus without complication ([place])    GERD (gastroesophageal reflux disease)    Hypothyroidism     Patient Active Problem List   Diagnosis Date Noted   Closed fracture of greater trochanter of right femur ([place]) 04/29/2018   Hypertensive urgency 04/29/2018   Acute lower UTI 04/29/2018   Tibial plateau fracture, right 04/25/2017   Pulmonary mass 04/25/2017   Tibial plateau fracture 04/25/2017   Fracture of tibial shaft, left, closed 12/18/2016   Fracture of left proximal fibula 12/18/2016   GERD (gastroesophageal reflux disease)    Depression with anxiety    Fever 09/14/2014   Compression fracture of body of thoracic vertebra ([place]) 09/14/2014   Osteoporosis 09/14/2014   Irregular heart beats 09/14/2014   Abnormal chest x-ray    Acute blood loss anemia    Macrocytic anemia    Obesity 09/12/2014   Fall 09/11/2014   UTI (lower urinary tract infection) 09/11/2014   CKD (chronic kidney disease), stage IV ([place]) 09/11/2014   Fracture, proximal femur ([place]) 09/11/2014   Femur fracture, left ([place]) 09/11/2014   Opacity of lung on imaging study 09/11/2014   URINARY RETENTION 10/17/2008   HYPERTENSION 09/07/2008   HYPOKALEMIA 07/21/2008   Dementia ([place]) 07/21/2008    KNEE PAIN, BILATERAL 05/17/2008   ACCIDENTAL FALL, HX OF 05/17/2008   ABDOMINAL PAIN OTHER SPECIFIED SITE 02/05/2008   DYSPHAGIA UNSPECIFIED 01/13/2008   Anemia 09/21/2007   THYROIDECTOMY, HX OF 09/03/2007   LAMINECTOMY, LUMBAR, HX OF 09/03/2007   INTERTRIGO, CANDIDAL 06/24/2007   CARCINOID TUMOR 05/01/2007   CHEST PAIN 05/01/2007   HEMORRHOIDS, EXTERNAL 12/23/2006   SYMP ASSOCIATED W/FEMALE GENITAL ORGANS NEC 12/23/2006   Hypothyroidism 04/28/2006   T2DM (type 2 diabetes mellitus) ([place]) 04/28/2006   HYPERLIPIDEMIA 04/28/2006   DEPRESSION 04/28/2006   Coronary atherosclerosis 04/28/2006   DEGENERATIVE DISC DISEASE, LUMBAR SPINE 04/28/2006   LUMBAR RADICULOPATHY 04/28/2006    Past Surgical History:  Procedure Laterality Date   FEMUR IM NAIL Left 09/12/2014   Procedure: INTRAMEDULLARY (IM) NAIL FEMORAL;  Surgeon: Leandrew Koyanagi, MD;  Location: [facility];  Service: Orthopedics;  Laterality: Left;   LAMINECTOMY     TIBIA IM NAIL INSERTION Left 12/19/2016   Procedure: INTRAMEDULLARY (IM) NAIL TIBIAL;  Surgeon: Leandrew Koyanagi, MD;  Location: [facility];  Service: Orthopedics;  Laterality: Left;     OB History   No obstetric history on file.     Family History  Problem Relation Age of Onset   Kidney disease Mother    Kidney disease Father     Social History   Tobacco Use   Smoking status: Former   Smokeless tobacco: Never  Scientific laboratory technician Use: Never used  Substance  Use Topics   Alcohol use: No    Alcohol/week: 0.0 standard drinks   Drug use: No    Home Medications Prior to Admission medications   Medication Sig Start Date End Date Taking? Authorizing Provider  calcium carbonate (OS-CAL - DOSED IN MG OF ELEMENTAL CALCIUM) 1250 (500 CA) MG tablet Take 1 tablet (500 mg of elemental calcium total) by mouth daily with breakfast. 09/19/14   Juluis Mire, MD  Cholecalciferol 1000 UNITS capsule Take 1,000 Units by mouth daily.    [provider]  donepezil (ARICEPT) 10  MG tablet Take 10 mg by mouth at bedtime.    [provider]  escitalopram (LEXAPRO) 20 MG tablet Take 20 mg by mouth daily.     [provider]  feeding supplement, ENSURE ENLIVE, (ENSURE ENLIVE) LIQD Take 237 mLs by mouth 2 (two) times daily between meals. 09/19/14   Juluis Mire, MD  iron polysaccharides (NIFEREX) 150 MG capsule Take 150 mg 2 (two) times daily by mouth.    [provider]  levothyroxine (SYNTHROID, LEVOTHROID) 25 MCG tablet Take 25 mcg by mouth daily before breakfast.    [provider]  LORazepam (ATIVAN) 0.5 MG tablet Take 0.125 mg by mouth 2 (two) times daily.    [provider]  memantine (NAMENDA) 10 MG tablet Take 10 mg by mouth 2 (two) times daily.    [provider]  Menthol, Topical Analgesic, (BIOFREEZE EX) Apply 1 application topically 3 (three) times daily. 3% Apply to knees    [provider]  Multiple Vitamins-Minerals (MULTIVITAMIN WITH MINERALS) tablet Take 1 tablet by mouth daily.    [provider]  nystatin (NYSTATIN) powder Apply 1 Bottle topically 3 (three) times daily as needed (skin irritation).    [provider]  omeprazole (PRILOSEC) 40 MG capsule Take 40 mg by mouth 2 (two) times daily.    [provider]  pantoprazole (PROTONIX) 40 MG tablet Take 40 mg by mouth daily.    [provider]  phenazopyridine (PYRIDIUM) 200 MG tablet Take 200 mg by mouth daily as needed for pain (painful urination).    [provider]  Polyethyl Glycol-Propyl Glycol (SYSTANE) 0.4-0.3 % SOLN Apply 1 drop to eye 2 (two) times daily as needed (dry eyes).    [provider]  senna (SENOKOT) 8.6 MG tablet Take 1 tablet by mouth 2 (two) times daily.    [provider]  solifenacin (VESICARE) 5 MG tablet Take 5 mg by mouth daily.    [provider]  traZODone (DESYREL) 50 MG tablet Take 50 mg by mouth at bedtime.    [provider]     Allergies    Diazepam, Penicillins, and Sulfonamide derivatives  Review of Systems   Review of Systems Level 5 caveat cannot obtain review of systems due to patient condition Physical Exam Updated Vital Signs BP (!) 85/39    Pulse (!) 41    Resp 16    SpO2 98%   Physical Exam Constitutional:      Comments: Patient is poorly responsive.  Moaning slightly.  Mild increased work of breathing.  Pale in appearance.  HENT:     Head: Normocephalic and atraumatic.     Mouth/Throat:     Pharynx: Oropharynx is clear.  Cardiovascular:     Comments: Bradycardic.  Distant heart sounds. Pulmonary:     Comments: Mild increased work of breathing.  Breath sounds are soft symmetric Abdominal:     General: There is  no distension.     Palpations: Abdomen is soft.     Tenderness: There is no abdominal tenderness. There is no guarding.  Musculoskeletal:     Comments: Patient is wearing heel boots.  2-3+ edema bilaterally.  Skin:    General: Skin is warm and dry.     Coloration: Skin is pale.  Neurological:     Comments: Patient is moaning.  Very poorly responsive.  No significant pain response to IV start.  Does not follow any commands.    ED Results / Procedures / Treatments   Labs (all labs ordered are listed, but only abnormal results are displayed) Labs Reviewed  COMPREHENSIVE METABOLIC PANEL - Abnormal; Notable for the following components:      Result Value   Potassium 6.3 (*)    Chloride 120 (*)    CO2 8 (*)    Glucose, Bld 168 (*)    BUN 59 (*)    Creatinine, Ser 5.35 (*)    Calcium 7.9 (*)    Total Protein 4.6 (*)    Albumin 1.8 (*)    AST 70 (*)    Alkaline Phosphatase 201 (*)    GFR, Estimated 7 (*)    All other components within normal limits  LACTIC ACID, PLASMA - Abnormal; Notable for the following components:   Lactic Acid, Venous 3.0 (*)    All other components within normal limits  CBC WITH DIFFERENTIAL/PLATELET - Abnormal; Notable for the following components:    RBC 3.72 (*)    MCV 105.6 (*)    RDW 17.2 (*)    nRBC 4.1 (*)    Lymphs Abs 0.3 (*)    All other components within normal limits  PROTIME-INR - Abnormal; Notable for the following components:   Prothrombin Time 16.1 (*)    INR 1.3 (*)    All other components within normal limits  I-STAT CHEM 8, ED - Abnormal; Notable for the following components:   Potassium 6.3 (*)    Chloride 120 (*)    BUN 94 (*)    Creatinine, Ser 4.90 (*)    Glucose, Bld 155 (*)    TCO2 14 (*)    Hemoglobin 15.3 (*)    All other components within normal limits  CBG MONITORING, ED - Abnormal; Notable for the following components:   Glucose-Capillary 138 (*)    All other components within normal limits  TROPONIN I (HIGH SENSITIVITY) - Abnormal; Notable for the following components:   Troponin I (High Sensitivity) 31 (*)    All other components within normal limits  RESP PANEL BY RT-PCR (FLU A&B, COVID) ARPGX2  LACTIC ACID, PLASMA  URINALYSIS, ROUTINE W REFLEX MICROSCOPIC  TROPONIN I (HIGH SENSITIVITY)    EKG EKG Interpretation  Date/Time:  Wednesday May 23 2021 19:00:23 EST Ventricular Rate:  42 PR Interval:  176 QRS Duration: 137 QT Interval:  678 QTC Calculation: 567 R Axis:   -39 Text Interpretation: Sinus bradycardia Left bundle branch block agree, rate slower Confirmed by Charlesetta Shanks [phone]) on 05/28/2021 7:21:55 PM  Radiology DG Chest Port 1 View  Result Date: 05/17/2021 CLINICAL DATA:  Hypoxia. EXAM: PORTABLE CHEST 1 VIEW COMPARISON:  Chest radiograph dated 06/18/2018. FINDINGS: Bilateral pulmonary opacities, left greater than right, may represent edema but concerning for pneumonia. Clinical correlation is recommended small left pleural effusion. No pneumothorax. Mild cardiomegaly. Atherosclerotic calcification of the aorta. Degenerative changes of the spine. No acute osseous pathology. IMPRESSION: Findings concerning for multifocal pneumonia and/or asymmetric edema. Electronically  Signed   By: Anner Crete M.D.   On: 05/11/2021 19:41    Procedures Procedures  CRITICAL CARE Performed by: Charlesetta Shanks   Total critical care time: 30 minutes  Critical care time was exclusive of separately billable procedures and treating other patients.  Critical care was necessary to treat or prevent imminent or life-threatening deterioration.  Critical care was time spent personally by me on the following activities: development of treatment plan with patient and/or surrogate as well as nursing, discussions with consultants, evaluation of patient's response to treatment, examination of patient, obtaining history from patient or surrogate, ordering and performing treatments and interventions, ordering and review of laboratory studies, ordering and review of radiographic studies, pulse oximetry and re-evaluation of patient's condition.  Medications Ordered in ED Medications  aztreonam (AZACTAM) 2 g in sodium chloride 0.9 % 100 mL IVPB (2 g Intravenous New Bag/Given 05/26/2021 2058)  vancomycin (VANCOREADY) IVPB 1750 mg/350 mL (1,750 mg Intravenous New Bag/Given 05/31/2021 2113)  calcium gluconate inj 10% (1 g) URGENT USE ONLY! (has no administration in time range)  albuterol (PROVENTIL) (2.5 MG/3ML) 0.083% nebulizer solution 10 mg (has no administration in time range)  insulin aspart (novoLOG) injection 5 Units (has no administration in time range)    And  dextrose 50 % solution 50 mL (has no administration in time range)  sodium chloride 0.9 % bolus 500 mL (0 mLs Intravenous Stopped 05/27/2021 1959)  lactated ringers bolus 1,000 mL (1,000 mLs Intravenous New Bag/Given 05/28/2021 2058)    ED Course  I have reviewed the triage vital signs and the nursing notes.  Pertinent labs & imaging results that were available during my care of the patient were reviewed by me and considered in my medical decision making (see chart for details).    MDM Rules/Calculators/A&P                            Patient presents critically ill.  History from patient's friend is that she was diagnosed COVID within the last week or so.  She has had some steady decline over 3 to 4 days.  On arrival patient is bradycardic in the 40s.  Blood pressure systolic in the 35W.  Oxygen saturation on room air 85%.  Initial measures for comfort established.  Patient placed on supplemental oxygen and warm fluids initiated.  At that time with patient DNR and having reviewed with patient's close friend, will not start pressors.  We will proceed with basic evaluation for reversible causes.  The meantime goal to keep patient comfortable.  These are suggestive of pneumonia.  This may be secondary pneumonia after having had COVID or aspiration pneumonia.  Antibiotics initiated for spittle acquired pneumonia.  Also patient was found to have acute renal failure with hyperkalemia.  Rehydration and treatment for hyperkalemia.   Condition is poor with advanced age and significant comorbidity.  Patient is DNR.   Final Clinical Impression(s) / ED Diagnoses Final diagnoses:  Healthcare-associated pneumonia  COVID  Acute renal failure, unspecified acute renal failure type ([place])  Hyperkalemia    Rx / DC Orders ED Discharge Orders     None        Charlesetta Shanks, MD 05/26/21 [phone]

## 2021-05-23 NOTE — H&P (Addendum)
Date: 2021/06/07               Patient Name:  Alexandra Booth MRN: [id number]  DOB: Jan 09, 1924 Age / Sex: 87 y.o., female   PCP: Janifer Adie, MD         Medical Service: Internal Medicine Teaching Service         Attending Physician: Dr. Evette Doffing, Mallie Mussel, *    First Contact: Dr. Ileene Musa Pager: [phone]  Second Contact: Dr. Lisabeth Devoid Pager: [phone]       After Hours (After 5p/  First Contact Pager: [phone]  weekends / holidays): Second Contact Pager: [phone]   Chief Complaint: AMS; hypotensive  History of Present Illness: According to ED provider, patient was diagnosed with COVID last week.  Patient resides in [place] home.  Staff noted decreased oral intake and decreased activity level.  They also noted altered mental status progressing over the last several days.  Emergency contact: Friend of patient, Alexandra Booth stated patient wishes as DNR (which has been confirmed) and no extensive resuscitation measures (pressors).  Patient was somnolent and unresponsive to questions during interview.  Patient noted to have garbled breathing and responded minimally to verbal stimuli with eye contact.  Meds:  No current facility-administered medications on file prior to encounter.   Current Outpatient Medications on File Prior to Encounter  Medication Sig Dispense Refill   calcium carbonate (OS-CAL - DOSED IN MG OF ELEMENTAL CALCIUM) 1250 (500 CA) MG tablet Take 1 tablet (500 mg of elemental calcium total) by mouth daily with breakfast.     Cholecalciferol 1000 UNITS capsule Take 1,000 Units by mouth daily.     donepezil (ARICEPT) 10 MG tablet Take 10 mg by mouth at bedtime.     escitalopram (LEXAPRO) 20 MG tablet Take 20 mg by mouth daily.      feeding supplement, ENSURE ENLIVE, (ENSURE ENLIVE) LIQD Take 237 mLs by mouth 2 (two) times daily between meals. 237 mL 12   iron polysaccharides (NIFEREX) 150 MG capsule Take 150 mg 2 (two) times daily by mouth.     levothyroxine  (SYNTHROID, LEVOTHROID) 25 MCG tablet Take 25 mcg by mouth daily before breakfast.     LORazepam (ATIVAN) 0.5 MG tablet Take 0.125 mg by mouth 2 (two) times daily.     memantine (NAMENDA) 10 MG tablet Take 10 mg by mouth 2 (two) times daily.     Menthol, Topical Analgesic, (BIOFREEZE EX) Apply 1 application topically 3 (three) times daily. 3% Apply to knees     Multiple Vitamins-Minerals (MULTIVITAMIN WITH MINERALS) tablet Take 1 tablet by mouth daily.     nystatin (NYSTATIN) powder Apply 1 Bottle topically 3 (three) times daily as needed (skin irritation).     omeprazole (PRILOSEC) 40 MG capsule Take 40 mg by mouth 2 (two) times daily.     pantoprazole (PROTONIX) 40 MG tablet Take 40 mg by mouth daily.     phenazopyridine (PYRIDIUM) 200 MG tablet Take 200 mg by mouth daily as needed for pain (painful urination).     Polyethyl Glycol-Propyl Glycol (SYSTANE) 0.4-0.3 % SOLN Apply 1 drop to eye 2 (two) times daily as needed (dry eyes).     senna (SENOKOT) 8.6 MG tablet Take 1 tablet by mouth 2 (two) times daily.     solifenacin (VESICARE) 5 MG tablet Take 5 mg by mouth daily.     traZODone (DESYREL) 50 MG tablet Take 50 mg by mouth at bedtime.  No outpatient medications have been marked as taking for the 06/01/2021 encounter [facility] Encounter).     Allergies: Allergies as of 05/14/2021 - Review Complete 10/20/2018  Allergen Reaction Noted   Diazepam     Penicillins     Sulfonamide derivatives     Past Medical History:  Diagnosis Date   Dementia ([place])    Depression with anxiety    Diabetes mellitus without complication ([place])    GERD (gastroesophageal reflux disease)    Hypothyroidism     Family History:  Family History  Problem Relation Age of Onset   Kidney disease Mother    Kidney disease Father      Social History:  Social History   Socioeconomic History   Marital status: Widowed    Spouse name: Not on file   Number of children: Not on file   Years of education:  Not on file   Highest education level: Not on file  Occupational History   Not on file  Tobacco Use   Smoking status: Former   Smokeless tobacco: Never  Vaping Use   Vaping Use: Never used  Substance and Sexual Activity   Alcohol use: No    Alcohol/week: 0.0 standard drinks   Drug use: No   Sexual activity: Not on file  Other Topics Concern   Not on file  Social History Narrative   Lives alone, has home health aid that comes daily to assist with meal preparation.    Social Determinants of Health   Financial Resource Strain: Not on file  Food Insecurity: Not on file  Transportation Needs: Not on file  Physical Activity: Not on file  Stress: Not on file  Social Connections: Not on file  Intimate Partner Violence: Not on file       Review of Systems: A complete ROS was negative except as per HPI.   Physical Exam: Blood pressure (!) 65/37, pulse (!) 38, resp. rate 14, SpO2 98 %. Physical Exam Constitutional:      Appearance: She is ill-appearing.     Interventions: Nasal cannula in place.  HENT:     Head: Normocephalic and atraumatic.  Cardiovascular:     Rate and Rhythm: Bradycardia present.     Pulses: Decreased pulses.     Heart sounds: Heart sounds are distant.  Pulmonary:     Effort: Respiratory distress present.     Comments: Gargle breathing noted Abdominal:     Palpations: Abdomen is soft.  Musculoskeletal:     Right lower leg: 1+ Edema present.     Left lower leg: 1+ Edema present.  Skin:    General: Skin is warm and dry.  Neurological:     Mental Status: She is lethargic.     Cranial Nerves: Facial asymmetry present.     Comments: Respond to verbal stimuli; did not answer any questions. Not at baseline according to friend.      EKG: personally reviewed my interpretation is Sinus bradycardia  DG Chest Port 1 View  Result Date: 05/15/2021 CLINICAL DATA:  Hypoxia. EXAM: PORTABLE CHEST 1 VIEW COMPARISON:  Chest radiograph dated 06/18/2018.  FINDINGS: Bilateral pulmonary opacities, left greater than right, may represent edema but concerning for pneumonia. Clinical correlation is recommended small left pleural effusion. No pneumothorax. Mild cardiomegaly. Atherosclerotic calcification of the aorta. Degenerative changes of the spine. No acute osseous pathology. IMPRESSION: Findings concerning for multifocal pneumonia and/or asymmetric edema. Electronically Signed   By: Anner Crete M.D.   On: 06/04/2021 19:41  Assessment & Plan by Problem: Principal Problem:   Severe sepsis with septic shock (HCC) Active Problems:   Dementia ([place])   CKD (chronic kidney disease), stage IV (HCC)   CAP (community acquired pneumonia)   AKI (acute kidney injury) ([place])   Hyperkalemia   Lactic acidosis   Acute encephalopathy   Respiratory failure with hypoxia (HCC)   #Acute hypoxic respiratory failure 2/2 COVID Pneumonia  #Severe Sepsis with septic shock #Acute renal failure Patient presented critically ill. History obtained from ED provider, patient was diagnosed with COVID 1 week prior. Since then has been having steady decline over the last 3 to 4 days. On arrival patient is bradycardic in the 30-40s; hypotensive SBP in the 60s. Patient placed on 2L [place] due to O2 saturation in the 80s. Oxygen saturation improved with supplementation into the 90s. CXR suggestive of pneumonia with bilateral pulmonary opacities. Patient started on IV vancomycin and aztreonam. So far patient has received 1.5L LR fluids since admission. Severe hypotension despite fluid resuscitation, bradycardic with MAPs below 60. Multi organ failure with elevated creatinine of 5.38 and in acute hypoxic respiratory failure requiring new oxygen requirement.  Patient has a elevated lactic acid of 3--->4.1.  Patient is also COVID-positive.  According to patient's friend at baseline patient is A&Ox4 and able to communicate.However,  On physical exam, patient was somnolent and responded  minimally to verbal stimuli with eye contact.  Gargled breathing noted throughout exam.  Heart sounds distant and pulse thready.  Lungs auscultated anteriorly decreased breath sounds.  Patient is hemodynamically unstable.  Patient is from a nursing facility and currently has a DNR status in place.  Next of kin, was not able to be reached via phone, multiple attempts went straight to VM.  Attempted to call nursing facility, no answer.  According to ED provider, the patient's friend Alexandra Booth who is the closest to the patient (emergency contact) stated that patient would want DNR with no pressor assistance.  Unable to locate MOST form in patient's chart.  Will attempt to contact nursing facility in the morning for clarification. PCCM did not recommend aggressive treatment or pressor support.  Patient to be admitted onto floors with supportive care. --Goal overnight is to provide reasonable measures and transition to comfort care if applicable --Continue antibiotics: Vancomycin and aztreonam --IV LR 1 L bolus followed by IV LR 100 mL/h continuous --Trend lactic acid  --Trend CBC --Trend BMP --Tylenol 650 every 6 as needed for mild pain/fever --IV dilaudid .5mg  Q2H Prn for severe pain, dyspnea --Consult palliative care in the a.m. -- Status: DNR no pressors  #Hyperkalemia  Patient presents to the ED critically ill.  CMP significant for elevated creatinine levels at 5.38.  Potassium levels were elevated at 6.3.  EKG shows sinus bradycardia without any significant abnormalities.  Patient received calcium gluconate and albuterol nebulizer in the ED.  Will monitor for improvement. --Trend BMP  Dementia According to patient's friend, patient is A&Ox4.  Patient resides in a nursing facility.  Home medication includes donepezil 10 mg at bedtime and memantine 10 mg twice daily.  Patient is somnolent and currently on O2 supplementation for acute hypoxic respiratory failure.  We will hold oral medications; risk for  aspiration --Hold donepezil 10 mg and memantine 10 mg  Diabetes Mellitus w/o complications Last W6F on file 6 years ago at 5.6%.  CBG on admission 138, acceptable.  In the setting of acute illness, will hold diabetic medications, if any.  Will consider using insulin if potassium levels  has not improved with recent treatment of calcium gluconate and albuterol nebulizer.  GERD Home medication include Protonix 40 mg daily.  We will hold oral medication, risk for aspiration --Hold Protonix 40 mg  Hypothyroidism Home medication includes Synthroid 25 mcg daily.  We will hold in the setting of acute illness and risk for aspiration. --Hold Synthroid 25 mcg  Depression Anxiety Home medication include trazodone 50 mg at bedtime.  We will hold oral medication, risk for aspiration. --Hold trazodone 50 mg   Dispo: Admit patient to Inpatient with expected length of stay greater than 2 midnights.  Signed: Timothy Lasso, MD 06-13-21, 12:08 AM  Pager: [phone] After 5pm on weekdays and 1pm on weekends: On Call pager: [phone]

## 2021-05-23 NOTE — ED Notes (Signed)
RN aware of pts BP.  

## 2021-05-24 DIAGNOSIS — J1282 Pneumonia due to coronavirus disease 2019: Secondary | ICD-10-CM | POA: Diagnosis present

## 2021-05-24 DIAGNOSIS — R6521 Severe sepsis with septic shock: Secondary | ICD-10-CM

## 2021-05-24 DIAGNOSIS — A419 Sepsis, unspecified organism: Secondary | ICD-10-CM

## 2021-05-24 DIAGNOSIS — U071 COVID-19: Secondary | ICD-10-CM | POA: Diagnosis present

## 2021-05-24 LAB — BASIC METABOLIC PANEL
Anion gap: 8 (ref 5–15)
Anion gap: 10 (ref 5–15)
BUN: 55 mg/dL — ABNORMAL HIGH (ref 8–23)
BUN: 53 mg/dL — ABNORMAL HIGH (ref 8–23)
CO2: 8 mmol/L — ABNORMAL LOW (ref 22–32)
CO2: 9 mmol/L — ABNORMAL LOW (ref 22–32)
Calcium: 7.6 mg/dL — ABNORMAL LOW (ref 8.9–10.3)
Calcium: 7.7 mg/dL — ABNORMAL LOW (ref 8.9–10.3)
Chloride: 123 mmol/L — ABNORMAL HIGH (ref 98–111)
Chloride: 120 mmol/L — ABNORMAL HIGH (ref 98–111)
Creatinine, Ser: 5.05 mg/dL — ABNORMAL HIGH (ref 0.44–1.00)
Creatinine, Ser: 5.16 mg/dL — ABNORMAL HIGH (ref 0.44–1.00)
GFR, Estimated: 7 mL/min — ABNORMAL LOW (ref >60)
GFR, Estimated: 7 mL/min — ABNORMAL LOW (ref >60)
Glucose, Bld: 173 mg/dL — ABNORMAL HIGH (ref 70–99)
Glucose, Bld: 143 mg/dL — ABNORMAL HIGH (ref 70–99)
Potassium: 3.9 mmol/L (ref 3.5–5.1)
Potassium: 4.5 mmol/L (ref 3.5–5.1)
Sodium: 139 mmol/L (ref 135–145)
Sodium: 139 mmol/L (ref 135–145)

## 2021-05-24 LAB — CBC WITH DIFFERENTIAL/PLATELET
Abs Immature Granulocytes: 0 10*3/uL (ref 0.00–0.07)
Basophils Absolute: 0 10*3/uL (ref 0.0–0.1)
Basophils Relative: 0 %
Eosinophils Absolute: 0 10*3/uL (ref 0.0–0.5)
Eosinophils Relative: 0 %
HCT: 32.9 % — ABNORMAL LOW (ref 36.0–46.0)
Hemoglobin: 10.4 g/dL — ABNORMAL LOW (ref 12.0–15.0)
Lymphocytes Relative: 11 %
Lymphs Abs: 0.2 10*3/uL — ABNORMAL LOW (ref 0.7–4.0)
MCH: 33.9 pg (ref 26.0–34.0)
MCHC: 31.6 g/dL (ref 30.0–36.0)
MCV: 107.2 fL — ABNORMAL HIGH (ref 80.0–100.0)
Monocytes Absolute: 0 10*3/uL — ABNORMAL LOW (ref 0.1–1.0)
Monocytes Relative: 2 %
Neutro Abs: 1.8 10*3/uL (ref 1.7–7.7)
Neutrophils Relative %: 87 %
Platelets: 195 10*3/uL (ref 150–400)
RBC: 3.07 MIL/uL — ABNORMAL LOW (ref 3.87–5.11)
RDW: 17.1 % — ABNORMAL HIGH (ref 11.5–15.5)
WBC: 2.1 10*3/uL — ABNORMAL LOW (ref 4.0–10.5)
nRBC: 15.5 % — ABNORMAL HIGH (ref 0.0–0.2)

## 2021-05-24 LAB — PATHOLOGIST SMEAR REVIEW

## 2021-05-24 LAB — LACTIC ACID, PLASMA: Lactic Acid, Venous: 5.3 mmol/L (ref 0.5–1.9)

## 2021-05-24 MED ORDER — ONDANSETRON 4 MG PO TBDP: 4.0000 mg | ORAL_TABLET | Freq: Four times a day (QID) | ORAL | Status: DC | PRN

## 2021-05-24 MED ORDER — HALOPERIDOL 0.5 MG PO TABS: 0.5000 mg | ORAL_TABLET | ORAL | Status: DC | PRN

## 2021-05-24 MED ORDER — HALOPERIDOL LACTATE 2 MG/ML PO CONC: 0.5000 mg | ORAL | Status: DC | PRN

## 2021-05-24 MED ORDER — ONDANSETRON HCL 4 MG/2ML IJ SOLN: 4.0000 mg | Freq: Four times a day (QID) | INTRAMUSCULAR | Status: DC | PRN

## 2021-05-24 MED ORDER — HALOPERIDOL LACTATE 5 MG/ML IJ SOLN: 0.5000 mg | INTRAMUSCULAR | Status: DC | PRN

## 2021-05-24 MED ORDER — GLYCOPYRROLATE 0.2 MG/ML IJ SOLN: 0.2000 mg | INTRAMUSCULAR | Status: DC | PRN

## 2021-05-24 MED ORDER — DIPHENHYDRAMINE HCL 50 MG/ML IJ SOLN: 12.5000 mg | INTRAMUSCULAR | Status: DC | PRN

## 2021-05-24 MED ORDER — GLYCOPYRROLATE 1 MG PO TABS: 1.0000 mg | ORAL_TABLET | ORAL | Status: DC | PRN

## 2021-06-10 NOTE — ED Notes (Signed)
Admitting paged to RN per her request 

## 2021-06-10 NOTE — ED Notes (Signed)
Cheral Marker grandson [phone] requesting an update on the patient

## 2021-06-10 NOTE — Progress Notes (Signed)
Called on call nurse with [facility]. She confirmed DNR status. MOST form from 2018 stated comfort care measures and DNR. Also stated antibiotics and IVF okay for a brief period but no feeding tube. Form did mention not transferring to a hospital facility if for comfort measures.   Contact listed per [facility]- grandson, Jannette Fogo, [phone] and Thurnell Lose friend at [phone]. Attempted contacting but unable to reach grandson or friend, left voicemail.     Harlow Ohms, DO PGY-3 IMTS

## 2021-06-10 NOTE — ED Notes (Signed)
This RN spoke with the admitting team to clarify pt's DNR status. Pt is a DNR and is now considered comfort care. Admitting will change orders to comfort care and change the bed request when able. This RN will continue to monitor.

## 2021-06-10 NOTE — ED Notes (Signed)
This RN went in to check on pt. Pt was not breathing and did not have a pulse. This RN pronounced the time of death at 80 am. This RN will call admitting to let the team know.

## 2021-06-10 NOTE — Discharge Summary (Signed)
°  Name: MARQUISHA NIKOLOV MRN: [id number] DOB: 03-Nov-1923 88 y.o.  Date of Admission: 06/04/2021  6:57 PM Date of Discharge: 08-Jun-2021 Attending Physician: Axel Filler, *  Discharge Diagnosis: Principal Problem:   Severe sepsis with septic shock [facility]) Active Problems:   Dementia ([place])   AKI (acute kidney injury) ([place])   Respiratory failure with hypoxia ([place])   Pneumonia due to COVID-19 virus   Cause of death: COVID-19 Pneumonia Time of death: 10:09 AM  Disposition and follow-up:   Ms.Yuriko L Mayers was discharged from [facility] in expired condition.    Hospital Course: Alexandra Booth is a 89. Y.o. female with a PMH of dementia, DMII, GERD, and hypothyroidism who was recently diagnosed with COVID-19 was brought to the emergency department from a skilled nursing facility for decreasing oral intake and activity level. She was found to be in septic shock with a rising lactic acid bilateral chest XR opacities consistent with COVID-19 pneumonia. She was treated with empiric antibiotics (vancomycin and aztreonam) and given 2.5 L of IV fluids but unfortunately continued to decline. She had multiorgan dysfunction with hypotension, sinus bradycardia, and hypoxia. Her MOST forms were reviewed which stated that she would not want any invasive measures or resuscitation attempts. After she failed to respond to the IV fluids and antibiotics she was transitioned to comfort care measures including PRN dilaudid for pain. She had no air hunger, anxiety, or signs of distress or pain and paced peacefully.   Signed: Scarlett Presto, MD 08-Jun-2021, 10:22 AM

## 2021-06-10 NOTE — ED Notes (Signed)
This RN was at bedside at time death was pronounced at 1009 and verified no pulse and no respirations. Verified no pulse by palpation and auscultation.

## 2021-06-10 DEATH — deceased
